# Patient Record
Sex: Female | Born: 1990 | Hispanic: No | Marital: Married | Smoking: Never smoker
Health system: Southern US, Community
[De-identification: ages and names within clinical notes are randomized; demographics above are authoritative.]

## PROBLEM LIST (undated history)

## (undated) ENCOUNTER — Inpatient Hospital Stay (HOSPITAL_COMMUNITY): Payer: Self-pay

## (undated) DIAGNOSIS — J309 Allergic rhinitis, unspecified: Secondary | ICD-10-CM

## (undated) DIAGNOSIS — Z87442 Personal history of urinary calculi: Secondary | ICD-10-CM

## (undated) DIAGNOSIS — G43909 Migraine, unspecified, not intractable, without status migrainosus: Secondary | ICD-10-CM

## (undated) DIAGNOSIS — F419 Anxiety disorder, unspecified: Secondary | ICD-10-CM

## (undated) DIAGNOSIS — N946 Dysmenorrhea, unspecified: Secondary | ICD-10-CM

## (undated) DIAGNOSIS — J45909 Unspecified asthma, uncomplicated: Secondary | ICD-10-CM

## (undated) DIAGNOSIS — D649 Anemia, unspecified: Secondary | ICD-10-CM

## (undated) DIAGNOSIS — N201 Calculus of ureter: Secondary | ICD-10-CM

## (undated) HISTORY — PX: VAGINAL HYSTERECTOMY: SUR661

## (undated) HISTORY — PX: ABDOMINAL HYSTERECTOMY: SHX81

## (undated) HISTORY — PX: ADENOIDECTOMY: SUR15

## (undated) HISTORY — PX: TONSILLECTOMY: SUR1361

## (undated) HISTORY — PX: WISDOM TOOTH EXTRACTION: SHX21

---
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
## 2014-02-26 ENCOUNTER — Other Ambulatory Visit (HOSPITAL_COMMUNITY): Payer: Self-pay | Admitting: Obstetrics and Gynecology

## 2014-02-26 DIAGNOSIS — IMO0002 Reserved for concepts with insufficient information to code with codable children: Secondary | ICD-10-CM

## 2014-03-04 ENCOUNTER — Ambulatory Visit (HOSPITAL_COMMUNITY): Payer: Self-pay

## 2014-03-18 ENCOUNTER — Encounter (HOSPITAL_COMMUNITY): Payer: Self-pay | Admitting: Emergency Medicine

## 2014-03-18 ENCOUNTER — Emergency Department (HOSPITAL_COMMUNITY): Payer: BC Managed Care – PPO

## 2014-03-18 ENCOUNTER — Emergency Department (HOSPITAL_COMMUNITY)
Admission: EM | Admit: 2014-03-18 | Discharge: 2014-03-18 | Disposition: A | Discharge status: Home / Self Care | Payer: BC Managed Care – PPO | Attending: Emergency Medicine | Admitting: Emergency Medicine

## 2014-03-18 DIAGNOSIS — N200 Calculus of kidney: Secondary | ICD-10-CM | POA: Insufficient documentation

## 2014-03-18 DIAGNOSIS — Z3202 Encounter for pregnancy test, result negative: Secondary | ICD-10-CM | POA: Insufficient documentation

## 2014-03-18 DIAGNOSIS — Z79899 Other long term (current) drug therapy: Secondary | ICD-10-CM | POA: Insufficient documentation

## 2014-03-18 LAB — CBC WITH DIFFERENTIAL/PLATELET
Basophils Absolute: 0 10*3/uL (ref 0.0–0.1)
Basophils Relative: 0 % (ref 0–1)
EOS ABS: 0.2 10*3/uL (ref 0.0–0.7)
EOS PCT: 3 % (ref 0–5)
HCT: 37.5 % (ref 36.0–46.0)
Hemoglobin: 12.6 g/dL (ref 12.0–15.0)
LYMPHS PCT: 39 % (ref 12–46)
Lymphs Abs: 2.1 10*3/uL (ref 0.7–4.0)
MCH: 31.3 pg (ref 26.0–34.0)
MCHC: 33.6 g/dL (ref 30.0–36.0)
MCV: 93.3 fL (ref 78.0–100.0)
Monocytes Absolute: 0.3 10*3/uL (ref 0.1–1.0)
Monocytes Relative: 6 % (ref 3–12)
Neutro Abs: 2.9 10*3/uL (ref 1.7–7.7)
Neutrophils Relative %: 52 % (ref 43–77)
PLATELETS: 203 10*3/uL (ref 150–400)
RBC: 4.02 MIL/uL (ref 3.87–5.11)
RDW: 11.7 % (ref 11.5–15.5)
WBC: 5.4 10*3/uL (ref 4.0–10.5)

## 2014-03-18 LAB — WET PREP, GENITAL
TRICH WET PREP: NONE SEEN
YEAST WET PREP: NONE SEEN

## 2014-03-18 LAB — URINE MICROSCOPIC-ADD ON

## 2014-03-18 LAB — URINALYSIS, ROUTINE W REFLEX MICROSCOPIC
Bilirubin Urine: NEGATIVE
Glucose, UA: NEGATIVE mg/dL
Glucose, UA: NEGATIVE mg/dL
KETONES UR: NEGATIVE mg/dL
KETONES UR: NEGATIVE mg/dL
LEUKOCYTES UA: NEGATIVE
NITRITE: NEGATIVE
Nitrite: NEGATIVE
PH: 7 (ref 5.0–8.0)
PROTEIN: NEGATIVE mg/dL
Protein, ur: NEGATIVE mg/dL
SPECIFIC GRAVITY, URINE: 1.011 (ref 1.005–1.030)
Specific Gravity, Urine: 1.025 (ref 1.005–1.030)
UROBILINOGEN UA: 0.2 mg/dL (ref 0.0–1.0)
Urobilinogen, UA: 0.2 mg/dL (ref 0.0–1.0)
pH: 6 (ref 5.0–8.0)

## 2014-03-18 LAB — POC URINE PREG, ED: Preg Test, Ur: NEGATIVE

## 2014-03-18 LAB — COMPREHENSIVE METABOLIC PANEL
ALT: 10 U/L (ref 0–35)
AST: 17 U/L (ref 0–37)
Albumin: 4.1 g/dL (ref 3.5–5.2)
Alkaline Phosphatase: 69 U/L (ref 39–117)
Anion gap: 13 (ref 5–15)
BUN: 9 mg/dL (ref 6–23)
CALCIUM: 9.6 mg/dL (ref 8.4–10.5)
CO2: 25 mEq/L (ref 19–32)
Chloride: 104 mEq/L (ref 96–112)
Creatinine, Ser: 0.73 mg/dL (ref 0.50–1.10)
GFR calc Af Amer: >90 mL/min (ref >90)
GFR calc non Af Amer: >90 mL/min (ref >90)
Glucose, Bld: 111 mg/dL — ABNORMAL HIGH (ref 70–99)
POTASSIUM: 3.6 meq/L — AB (ref 3.7–5.3)
SODIUM: 142 meq/L (ref 137–147)
TOTAL PROTEIN: 7.3 g/dL (ref 6.0–8.3)
Total Bilirubin: 0.3 mg/dL (ref 0.3–1.2)

## 2014-03-18 MED ORDER — HYDROMORPHONE HCL PF 1 MG/ML IJ SOLN
0.5000 mg | Freq: Once | INTRAMUSCULAR | Status: DC
  Filled 2014-03-18: qty 1

## 2014-03-18 MED ORDER — OXYCODONE-ACETAMINOPHEN 5-325 MG PO TABS: ORAL_TABLET | ORAL | Status: DC

## 2014-03-18 MED ORDER — PROMETHAZINE HCL 25 MG PO TABS: 25.0000 mg | ORAL_TABLET | Freq: Four times a day (QID) | ORAL | Status: DC | PRN

## 2014-03-18 MED ORDER — HYDROMORPHONE HCL PF 1 MG/ML IJ SOLN
1.0000 mg | Freq: Once | INTRAMUSCULAR | Status: AC
  Administered 2014-03-18: 1 mg via INTRAVENOUS
  Filled 2014-03-18: qty 1

## 2014-03-18 MED ORDER — ONDANSETRON HCL 4 MG/2ML IJ SOLN
4.0000 mg | Freq: Once | INTRAMUSCULAR | Status: AC
  Administered 2014-03-18: 4 mg via INTRAVENOUS
  Filled 2014-03-18: qty 2

## 2014-03-18 NOTE — Discharge Instructions (Signed)
Push fluids: take small frequent sips of water or Gatorade, do not drink any soda, juice or caffeinated beverages.  Take percocet for breakthrough pain, do not drink alcohol, drive, care for children or do other critical tasks while taking percocet.  Strain all urine and retain any stone for analysis at your urologist.   Return to the ED if you develop fever, have vomiting that is not controlled with the medication or if pain becomes too severe.  Do not hesitate to return to the Emergency Department for any new, worsening or concerning symptoms.   If you do not have a primary care doctor you can establish one at the   [facility]: [address] [place] [id number] [phone]  After you establish care. Let them know you were seen in the emergency room. They must obtain records for further management.    Kidney Stones Kidney stones (urolithiasis) are solid masses that form inside your kidneys. The intense pain is caused by the stone moving through the kidney, ureter, bladder, and urethra (urinary tract). When the stone moves, the ureter starts to spasm around the stone. The stone is usually passed in your pee (urine).  HOME CARE  Drink enough fluids to keep your pee clear or pale yellow. This helps to get the stone out.  Strain all pee through the provided strainer. Do not pee without peeing through the strainer, not even once. If you pee the stone out, catch it in the strainer. The stone may be as small as a grain of salt. Take this to your doctor. This will help your doctor figure out what you can do to try to prevent more kidney stones.  Only take medicine as told by your doctor.  Follow up with your doctor as told.  Get follow-up X-rays as told by your doctor. GET HELP IF: You have pain that gets worse even if you have been taking pain medicine. GET HELP RIGHT AWAY IF:   Your pain does not get better with medicine.  You have a fever or shaking  chills.  Your pain increases and gets worse over 18 hours.  You have new belly (abdominal) pain.  You feel faint or pass out.  You are unable to pee. MAKE SURE YOU:   Understand these instructions.  Will watch your condition.  Will get help right away if you are not doing well or get worse. Document Released: 02/16/2008 Document Revised: 05/02/2013 Document Reviewed: 01/31/2013 [facility] Patient Information 2015 [place], [place]. This information is not intended to replace advice given to you by your health care provider. Make sure you discuss any questions you have with your health care provider.

## 2014-03-18 NOTE — ED Notes (Signed)
Patient transported to Ultrasound 

## 2014-03-18 NOTE — ED Provider Notes (Signed)
CSN: [id number]     Arrival date & time 03/18/14  1116 History   First MD Initiated Contact with Patient 03/18/14 1127     Chief Complaint  Patient presents with  . Abdominal Pain     (Consider location/radiation/quality/duration/timing/severity/associated sxs/prior Treatment) HPI Comments: Patient presents today with a chief complaint of right sided abdominal pain.  Pain located in both the RLQ and RUQ, but worse in the RLQ and right pelvic region.  She reports that the pain has been present intermittently over the past three days.  She reports that the past two days the pain would typically last for a few hours at a time.  However, she reports that the pain has become constant today.  She describes the pain as a sharp pain.  She states that the pain does not radiate.  Pain worse with palpation and also movement.  She reports that the pain is associated with some pain of the right flank area also.  She states that two days ago she had one episode of vomiting, but no vomiting since that time.  She denies urinary symptoms.  Denies fever or chills.  Denies diarrhea or constipation.  Denies vaginal discharge.  She denies any history of Kidney Stones.    Patient is a 23 y.o. female presenting with abdominal pain. The history is provided by the patient.  Abdominal Pain   History reviewed. No pertinent past medical history. Past Surgical History  Procedure Laterality Date  . Tonsillectomy     No family history on file. History  Substance Use Topics  . Smoking status: Never Smoker   . Smokeless tobacco: Not on file  . Alcohol Use: No   OB History   Grav Para Term Preterm Abortions TAB SAB Ect Mult Living                 Review of Systems  Gastrointestinal: Positive for abdominal pain.  All other systems reviewed and are negative.     Allergies  Review of patient's allergies indicates no known allergies.  Home Medications   Prior to Admission medications   Medication Sig Start  Date End Date Taking? Authorizing Provider  Alpha-D-Galactosidase Charlyne Quale(BEANO) TABS Take 1 tablet by mouth as needed (flatulence).   Yes Historical Provider, MD  anti-nausea (EMETROL) solution Take 10 mLs by mouth every 15 (fifteen) minutes as needed for nausea or vomiting.   Yes Historical Provider, MD  lisdexamfetamine (VYVANSE) 50 MG capsule Take 50 mg by mouth every morning.   Yes Historical Provider, MD  senna (SENOKOT) 8.6 MG TABS tablet Take 1 tablet by mouth daily as needed for mild constipation.   Yes Historical Provider, MD   BP 110/83  Pulse 81  Temp(Src) 97.9 F (36.6 C) (Oral)  Resp 16  SpO2 100%  LMP 02/27/2014 Physical Exam  Nursing note and vitals reviewed. Constitutional: She appears well-developed and well-nourished.  Uncomfortable appearing  HENT:  Head: Normocephalic and atraumatic.  Mouth/Throat: Oropharynx is clear and moist.  Neck: Normal range of motion. Neck supple.  Cardiovascular: Normal rate, regular rhythm and normal heart sounds.   Pulmonary/Chest: Effort normal and breath sounds normal.  Abdominal: Soft. Normal appearance and bowel sounds are normal. She exhibits no distension and no mass. There is tenderness in the right lower quadrant and suprapubic area. There is CVA tenderness and tenderness at McBurney's point. There is no rigidity, no rebound, no guarding and negative Murphy's sign.  Right CVA tenderness  Genitourinary: Cervix exhibits no motion tenderness. Right  adnexum displays tenderness. Right adnexum displays no mass and no fullness. Left adnexum displays no mass, no tenderness and no fullness.  Neurological: She is alert.  Skin: Skin is warm and dry.  Psychiatric: She has a normal mood and affect.    ED Course  Procedures (including critical care time) Labs Review Labs Reviewed  WET PREP, GENITAL - Abnormal; Notable for the following:    Clue Cells Wet Prep HPF POC FEW (*)    WBC, Wet Prep HPF POC MANY (*)    All other components within  normal limits  COMPREHENSIVE METABOLIC PANEL - Abnormal; Notable for the following:    Potassium 3.6 (*)    Glucose, Bld 111 (*)    All other components within normal limits  GC/CHLAMYDIA PROBE AMP  CBC WITH DIFFERENTIAL  URINALYSIS, ROUTINE W REFLEX MICROSCOPIC  POC URINE PREG, ED    Imaging Review Kore[place] Transvaginal Non-ob  03/18/2014   CLINICAL DATA:  Pelvic pain.  EXAM: TRANSABDOMINAL AND TRANSVAGINAL ULTRASOUND OF PELVIS  DOPPLER ULTRASOUND OF OVARIES  TECHNIQUE: Both transabdominal and transvaginal ultrasound examinations of the pelvis were performed. Transabdominal technique was performed for global imaging of the pelvis including uterus, ovaries, adnexal regions, and pelvic cul-de-sac.  It was necessary to proceed with endovaginal exam following the transabdominal exam to visualize the uterus and ovaries. Color and duplex Doppler ultrasound was utilized to evaluate blood flow to the ovaries.  COMPARISON:  None.  FINDINGS: Uterus  Measurements: 6.9 x 4.3 x 2.6 cm. Possible small fibroid measuring 1.7 x 1.4 x 0.9 cm is noted in the fundus.  Endometrium  Thickness: 8.7 mm.  No focal abnormality visualized.  Right ovary  Measurements: 3.5 x 2.4 x 3.1 cm. Probable simple parovarian cyst measuring 1.1 x 1.1 x 0.7 cm is noted in right adnexal region.  Left ovary  Measurements: 3.6 x 3.0 x 2.3 cm. Normal appearance/no adnexal mass.  Pulsed Doppler evaluation of both ovaries demonstrates normal low-resistance arterial and venous waveforms.  Other findings  Trace free fluid is noted in the pelvis which most likely is physiologic.  IMPRESSION: Probable small fibroid seen seen in the uterine fundus. 1.1 cm simple paraovarian cyst is noted in right adnexal region. No other abnormality seen in the pelvis.   Electronically Signed   By: Roque LiasJames  Green M.D.   On: 03/18/2014 14:41   [place] Pelvis Complete  03/18/2014   CLINICAL DATA:  Pelvic pain.  EXAM: TRANSABDOMINAL AND TRANSVAGINAL ULTRASOUND OF PELVIS  DOPPLER  ULTRASOUND OF OVARIES  TECHNIQUE: Both transabdominal and transvaginal ultrasound examinations of the pelvis were performed. Transabdominal technique was performed for global imaging of the pelvis including uterus, ovaries, adnexal regions, and pelvic cul-de-sac.  It was necessary to proceed with endovaginal exam following the transabdominal exam to visualize the uterus and ovaries. Color and duplex Doppler ultrasound was utilized to evaluate blood flow to the ovaries.  COMPARISON:  None.  FINDINGS: Uterus  Measurements: 6.9 x 4.3 x 2.6 cm. Possible small fibroid measuring 1.7 x 1.4 x 0.9 cm is noted in the fundus.  Endometrium  Thickness: 8.7 mm.  No focal abnormality visualized.  Right ovary  Measurements: 3.5 x 2.4 x 3.1 cm. Probable simple parovarian cyst measuring 1.1 x 1.1 x 0.7 cm is noted in right adnexal region.  Left ovary  Measurements: 3.6 x 3.0 x 2.3 cm. Normal appearance/no adnexal mass.  Pulsed Doppler evaluation of both ovaries demonstrates normal low-resistance arterial and venous waveforms.  Other findings  Trace free fluid  is noted in the pelvis which most likely is physiologic.  IMPRESSION: Probable small fibroid seen seen in the uterine fundus. 1.1 cm simple paraovarian cyst is noted in right adnexal region. No other abnormality seen in the pelvis.   Electronically Signed   By: Roque Lias M.D.   On: 03/18/2014 14:41   [place] Art/ven Flow Abd Pelv Doppler  03/18/2014   CLINICAL DATA:  Pelvic pain.  EXAM: TRANSABDOMINAL AND TRANSVAGINAL ULTRASOUND OF PELVIS  DOPPLER ULTRASOUND OF OVARIES  TECHNIQUE: Both transabdominal and transvaginal ultrasound examinations of the pelvis were performed. Transabdominal technique was performed for global imaging of the pelvis including uterus, ovaries, adnexal regions, and pelvic cul-de-sac.  It was necessary to proceed with endovaginal exam following the transabdominal exam to visualize the uterus and ovaries. Color and duplex Doppler ultrasound was utilized to  evaluate blood flow to the ovaries.  COMPARISON:  None.  FINDINGS: Uterus  Measurements: 6.9 x 4.3 x 2.6 cm. Possible small fibroid measuring 1.7 x 1.4 x 0.9 cm is noted in the fundus.  Endometrium  Thickness: 8.7 mm.  No focal abnormality visualized.  Right ovary  Measurements: 3.5 x 2.4 x 3.1 cm. Probable simple parovarian cyst measuring 1.1 x 1.1 x 0.7 cm is noted in right adnexal region.  Left ovary  Measurements: 3.6 x 3.0 x 2.3 cm. Normal appearance/no adnexal mass.  Pulsed Doppler evaluation of both ovaries demonstrates normal low-resistance arterial and venous waveforms.  Other findings  Trace free fluid is noted in the pelvis which most likely is physiologic.  IMPRESSION: Probable small fibroid seen seen in the uterine fundus. 1.1 cm simple paraovarian cyst is noted in right adnexal region. No other abnormality seen in the pelvis.   Electronically Signed   By: Roque Lias M.D.   On: 03/18/2014 14:41     EKG Interpretation None     3:00 PM Patient reports that her pain had improved, but has now returned.  Pelvic ultrasound negative.  Will order CT ab/pelvis w/o contrast to assess for possible kidney stone.   4:00 PM Patient signed out to [facility], PA-C at shift change.  CT abdomen/pelvis pending.   MDM   Final diagnoses:  None   Patient presents today with a chief complaint of right sided abdominal pain.  Patient with tenderness of the adnexa with pelvic exam.  Therefore, pelvic ultrasound with doppler ordered to rule out torsion.  Ultrasound negative for torsion.  Urine pregnancy negative.  Labs today unremarkable.  UA showing hemoglobin in the urine.  CT ab/pelvis w/o contrast then ordered to rule out kidney stone.  Results pending.  Joni Reining Pisciotta will follow up on results.      Santiago Glad, PA-C 03/19/14 2226

## 2014-03-18 NOTE — Progress Notes (Signed)
  CARE MANAGEMENT ED NOTE 03/18/2014  Patient:  Norma [name],Norma [name]   Account Number:  [id number]  Date Initiated:  03/18/2014  Documentation initiated by:  Radford PaxFERRERO,Bernardine Langworthy  Subjective/Objective Assessment:   Patient presents to Ed with lower abdominal pain     Subjective/Objective Assessment Detail:     Action/Plan:   Action/Plan Detail:   Anticipated DC Date:       Status Recommendation to Physician:   Result of Recommendation:    Other ED Services  Consult Working Plan    DC Planning Services  Other  PCP issues    Choice offered to / List presented to:            Status of service:  Completed, signed off  ED Comments:   ED Comments Detail:  EDCM spoke to patient at bedside.  Patient confirms she does not have a pcp.  EDCM instructed patient to call the phone number on the back of her insurance card or go to insurance company website to help her find a pcp who is close to her and within network. Patient verbalized understanding.  No further EDCM needs at this time.

## 2014-03-18 NOTE — ED Notes (Signed)
Pt c/o lower abd pain that radiates around to back x 2 days, had nausea but no v/d. Pt denies vaginal discharge, bleeding or hx of kidney stones.

## 2014-03-18 NOTE — ED Provider Notes (Signed)
PROGRESS NOTE                                                                                                                 This is a sign-out from PA Laisure  at shift change: Norma CostainHaley [name] is a 23 y.o. female presenting with right pelvic pain. Plan is f/u CT to r/o kidney stone. Please refer to previous note for full HPI, ROS, PMH and PE.  Patient seen and examined at the bedside: She is resting comfortably. States the pain is very mild, 3/10. States that she received her pain medication approximately 20 minutes ago.LSCTA b/l; Heart is RRR; Abd without TTP, guarding or rebound, MAE, goal oriented speech. No CVA tenderness bilaterally.   Filed Vitals:   03/18/14 1120 03/18/14 1454  BP: 110/83 114/63  Pulse: 81 71  Temp: 97.9 F (36.6 C)   TempSrc: Oral   Resp: 16 18  SpO2: 100% 99%    Medications  HYDROmorphone (DILAUDID) injection 0.5 mg (0.5 mg Intravenous Not Given 03/18/14 1836)  HYDROmorphone (DILAUDID) injection 1 mg (1 mg Intravenous Given 03/18/14 1209)  ondansetron (ZOFRAN) injection 4 mg (4 mg Intravenous Given 03/18/14 1209)  HYDROmorphone (DILAUDID) injection 1 mg (1 mg Intravenous Given 03/18/14 1519)    Norma CostainHaley [name] is a 23 y.o. female presenting with right pelvic pain. Patient has a 0.4 cm stone at the right UVJ, may have passed into the bladder. There is moderate hydronephrosis. His urinalysis shows many bacteria however the sample is highly contaminated. Will obtain a clean-catch sample to evaluate for infected stone.  Clean catch urinalysis shows no signs of infection. Advise patient to strain all urine and follow with urology. Discussed return precautions  Evaluation does not show pathology that would require ongoing emergent intervention or inpatient treatment. Pt is hemodynamically stable and mentating appropriately. Discussed findings and plan with patient/guardian, who agrees with care plan. All questions answered. Return precautions discussed and outpatient follow up  given.   Discharge Medication List as of 03/18/2014  6:16 PM    START taking these medications   Details  oxyCODONE-acetaminophen (PERCOCET/ROXICET) 5-325 MG per tablet 1 to 2 tabs PO q6hrs  PRN for pain, Print    promethazine (PHENERGAN) 25 MG tablet Take 1 tablet (25 mg total) by mouth every 6 (six) hours as needed for nausea or vomiting., Starting 03/18/2014, Until Discontinued, [employer] Kroger[employer]              Ty Oshima, PA-C 03/18/14 2027

## 2014-03-18 NOTE — ED Notes (Signed)
Pelvic setup and ready 

## 2014-03-19 LAB — GC/CHLAMYDIA PROBE AMP
CT Probe RNA: NEGATIVE
GC PROBE AMP APTIMA: NEGATIVE

## 2014-03-19 NOTE — ED Provider Notes (Signed)
Medical screening examination/treatment/procedure(s) were performed by non-physician practitioner and as supervising physician I was immediately available for consultation/collaboration.   EKG Interpretation None       Alexina Niccoli M Nicollette Wilhelmi, MD 03/19/14 1353 

## 2014-03-20 ENCOUNTER — Other Ambulatory Visit: Payer: Self-pay | Admitting: Urology

## 2014-03-20 NOTE — ED Provider Notes (Signed)
Medical screening examination/treatment/procedure(s) were performed by non-physician practitioner and as supervising physician I was immediately available for consultation/collaboration.   EKG Interpretation None        Zaraya Delauder David Adrienne Delay III, MD 03/20/14 1506 

## 2014-03-21 ENCOUNTER — Encounter (HOSPITAL_BASED_OUTPATIENT_CLINIC_OR_DEPARTMENT_OTHER): Payer: Self-pay | Admitting: *Deleted

## 2014-03-28 ENCOUNTER — Ambulatory Visit (HOSPITAL_BASED_OUTPATIENT_CLINIC_OR_DEPARTMENT_OTHER): Admission: RE | Admit: 2014-03-28 | Payer: BC Managed Care – PPO | Source: Ambulatory Visit | Admitting: Urology

## 2014-03-28 ENCOUNTER — Encounter (HOSPITAL_BASED_OUTPATIENT_CLINIC_OR_DEPARTMENT_OTHER): Admission: RE | Payer: Self-pay | Source: Ambulatory Visit

## 2014-03-28 HISTORY — DX: Calculus of ureter: N20.1

## 2014-03-28 SURGERY — CYSTOSCOPY, WITH CALCULUS MANIPULATION OR REMOVAL
Anesthesia: Choice | Laterality: Right

## 2014-06-07 LAB — OB RESULTS CONSOLE GC/CHLAMYDIA
CHLAMYDIA, DNA PROBE: NEGATIVE
Gonorrhea: NEGATIVE

## 2014-06-07 LAB — OB RESULTS CONSOLE RPR: RPR: NONREACTIVE

## 2014-06-07 LAB — OB RESULTS CONSOLE ANTIBODY SCREEN: Antibody Screen: NEGATIVE

## 2014-06-07 LAB — OB RESULTS CONSOLE ABO/RH: RH TYPE: POSITIVE

## 2014-06-07 LAB — OB RESULTS CONSOLE HIV ANTIBODY (ROUTINE TESTING): HIV: NONREACTIVE

## 2014-06-07 LAB — OB RESULTS CONSOLE RUBELLA ANTIBODY, IGM: Rubella: IMMUNE

## 2014-06-07 LAB — OB RESULTS CONSOLE HEPATITIS B SURFACE ANTIGEN: HEP B S AG: NEGATIVE

## 2014-09-13 NOTE — L&D Delivery Note (Signed)
Operative Delivery Note  At 7:02 PM a viable female was delivered via Vaginal, Vacuum Investment banker, operational(Extractor).  Presentation: vertex; Position: Right,, Occiput,, Posterior; Station: +4.  KIWI vacuum applied at +2 station which brought the baby to crowning then popped off. Patient continued to push without help until most of the head was crowning but FHR decreased to 80 bpm and no further movement. KIWI was reapplied and delivered baby's head with 1 traction at a maximum pressure of 500 mmHg  Verbal consent: obtained from patient.  Risks and benefits discussed in detail.  Risks include, but are not limited to the risks of anesthesia, bleeding, infection, damage to maternal tissues, fetal cephalhematoma.  There is also the risk of inability to effect vaginal delivery of the head, or shoulder dystocia that cannot be resolved by established maneuvers, leading to the need for emergency cesarean section.  APGAR: 9, 9 Placenta status: Intact, Spontaneous.   Cord: 3 vessels   Anesthesia: Epidural  Episiotomy: None Lacerations: 2nd degree Suture Repair: 3.0 Monocryl Est. Blood Loss (mL):  200  Mom to postpartum.  Baby to Couplet care / Skin to Skin.  Jodee Wagenaar A 01/31/2015, 7:21 PM

## 2014-11-12 ENCOUNTER — Inpatient Hospital Stay (HOSPITAL_COMMUNITY)
Admission: AD | Admit: 2014-11-12 | Payer: BC Managed Care – PPO | Source: Ambulatory Visit | Admitting: Obstetrics and Gynecology

## 2014-12-31 LAB — OB RESULTS CONSOLE GBS: GBS: NEGATIVE

## 2015-01-29 ENCOUNTER — Inpatient Hospital Stay (HOSPITAL_COMMUNITY)
Admission: AD | Admit: 2015-01-29 | Discharge: 2015-01-29 | Disposition: A | Discharge status: Home / Self Care | Payer: BLUE CROSS/BLUE SHIELD | Source: Ambulatory Visit | Attending: Obstetrics and Gynecology | Admitting: Obstetrics and Gynecology

## 2015-01-29 ENCOUNTER — Encounter (HOSPITAL_COMMUNITY): Payer: Self-pay

## 2015-01-29 DIAGNOSIS — Z6832 Body mass index (BMI) 32.0-32.9, adult: Secondary | ICD-10-CM

## 2015-01-29 DIAGNOSIS — O3663X0 Maternal care for excessive fetal growth, third trimester, not applicable or unspecified: Secondary | ICD-10-CM | POA: Diagnosis present

## 2015-01-29 DIAGNOSIS — Z833 Family history of diabetes mellitus: Secondary | ICD-10-CM

## 2015-01-29 DIAGNOSIS — E559 Vitamin D deficiency, unspecified: Secondary | ICD-10-CM | POA: Diagnosis present

## 2015-01-29 DIAGNOSIS — Z823 Family history of stroke: Secondary | ICD-10-CM

## 2015-01-29 DIAGNOSIS — Z8249 Family history of ischemic heart disease and other diseases of the circulatory system: Secondary | ICD-10-CM

## 2015-01-29 DIAGNOSIS — E669 Obesity, unspecified: Secondary | ICD-10-CM | POA: Diagnosis present

## 2015-01-29 DIAGNOSIS — O99214 Obesity complicating childbirth: Secondary | ICD-10-CM | POA: Diagnosis present

## 2015-01-29 DIAGNOSIS — Z3A40 40 weeks gestation of pregnancy: Secondary | ICD-10-CM | POA: Diagnosis present

## 2015-01-29 DIAGNOSIS — Z87442 Personal history of urinary calculi: Secondary | ICD-10-CM

## 2015-01-29 LAB — AMNISURE RUPTURE OF MEMBRANE (ROM) NOT AT ARMC: AMNISURE: NEGATIVE

## 2015-01-29 NOTE — Discharge Instructions (Signed)
Braxton Hicks Contractions °Contractions of the uterus can occur throughout pregnancy. Contractions are not always a sign that you are in labor.  °WHAT ARE BRAXTON HICKS CONTRACTIONS?  °Contractions that occur before labor are called Braxton Hicks contractions, or false labor. Toward the end of pregnancy (32-34 weeks), these contractions can develop more often and may become more forceful. This is not true labor because these contractions do not result in opening (dilatation) and thinning of the cervix. They are sometimes difficult to tell apart from true labor because these contractions can be forceful and people have different pain tolerances. You should not feel embarrassed if you go to the hospital with false labor. Sometimes, the only way to tell if you are in true labor is for your health care provider to look for changes in the cervix. °If there are no prenatal problems or other health problems associated with the pregnancy, it is completely safe to be sent home with false labor and await the onset of true labor. °HOW CAN YOU TELL THE DIFFERENCE BETWEEN TRUE AND FALSE LABOR? °False Labor °· The contractions of false labor are usually shorter and not as hard as those of true labor.   °· The contractions are usually irregular.   °· The contractions are often felt in the front of the lower abdomen and in the groin.   °· The contractions may go away when you walk around or change positions while lying down.   °· The contractions get weaker and are shorter lasting as time goes on.   °· The contractions do not usually become progressively stronger, regular, and closer together as with true labor.   °True Labor °1. Contractions in true labor last 30-70 seconds, become very regular, usually become more intense, and increase in frequency.   °2. The contractions do not go away with walking.   °3. The discomfort is usually felt in the top of the uterus and spreads to the lower abdomen and low back.   °4. True labor can  be determined by your health care provider with an exam. This will show that the cervix is dilating and getting thinner.   °WHAT TO REMEMBER °· Keep up with your usual exercises and follow other instructions given by your health care provider.   °· Take medicines as directed by your health care provider.   °· Keep your regular prenatal appointments.   °· Eat and drink lightly if you think you are going into labor.   °· If Braxton Hicks contractions are making you uncomfortable:   °· Change your position from lying down or resting to walking, or from walking to resting.   °· Sit and rest in a tub of warm water.   °· Drink 2-3 glasses of water. Dehydration may cause these contractions.   °· Do slow and deep breathing several times an hour.   °WHEN SHOULD I SEEK IMMEDIATE MEDICAL CARE? °Seek immediate medical care if: °· Your contractions become stronger, more regular, and closer together.   °· You have fluid leaking or gushing from your vagina.   °· You have a fever.   °· You pass blood-tinged mucus.   °· You have vaginal bleeding.   °· You have continuous abdominal pain.   °· You have low back pain that you never had before.   °· You feel your baby's head pushing down and causing pelvic pressure.   °· Your baby is not moving as much as it used to.   °Document Released: 08/30/2005 Document Revised: 09/04/2013 Document Reviewed: 06/11/2013 °ExitCare® Patient Information ©2015 [employer]. This information is not intended to replace advice given to you by your health care   provider. Make sure you discuss any questions you have with your health care provider. ° °Fetal Movement Counts °Patient Name: __________________________________________________ Patient Due Date: ____________________ °Performing a fetal movement count is highly recommended in high-risk pregnancies, but it is good for every pregnant woman to do. Your health care provider may ask you to start counting fetal movements at 28 weeks of the pregnancy. Fetal  movements often increase: °· After eating a full meal. °· After physical activity. °· After eating or drinking something sweet or cold. °· At rest. °Pay attention to when you feel the baby is most active. This will help you notice a pattern of your baby's sleep and wake cycles and what factors contribute to an increase in fetal movement. It is important to perform a fetal movement count at the same time each day when your baby is normally most active.  °HOW TO COUNT FETAL MOVEMENTS °5. Find a quiet and comfortable area to sit or lie down on your left side. Lying on your left side provides the best blood and oxygen circulation to your baby. °6. Write down the day and time on a sheet of paper or in a journal. °7. Start counting kicks, flutters, swishes, rolls, or jabs in a 2-hour period. You should feel at least 10 movements within 2 hours. °8. If you do not feel 10 movements in 2 hours, wait 2-3 hours and count again. Look for a change in the pattern or not enough counts in 2 hours. °SEEK MEDICAL CARE IF: °· You feel less than 10 counts in 2 hours, tried twice. °· There is no movement in over an hour. °· The pattern is changing or taking longer each day to reach 10 counts in 2 hours. °· You feel the baby is not moving as he or she usually does. °Date: ____________ Movements: ____________ Start time: ____________ Finish time: ____________  °Date: ____________ Movements: ____________ Start time: ____________ Finish time: ____________ °Date: ____________ Movements: ____________ Start time: ____________ Finish time: ____________ °Date: ____________ Movements: ____________ Start time: ____________ Finish time: ____________ °Date: ____________ Movements: ____________ Start time: ____________ Finish time: ____________ °Date: ____________ Movements: ____________ Start time: ____________ Finish time: ____________ °Date: ____________ Movements: ____________ Start time: ____________ Finish time: ____________ °Date: ____________  Movements: ____________ Start time: ____________ Finish time: ____________  °Date: ____________ Movements: ____________ Start time: ____________ Finish time: ____________ °Date: ____________ Movements: ____________ Start time: ____________ Finish time: ____________ °Date: ____________ Movements: ____________ Start time: ____________ Finish time: ____________ °Date: ____________ Movements: ____________ Start time: ____________ Finish time: ____________ °Date: ____________ Movements: ____________ Start time: ____________ Finish time: ____________ °Date: ____________ Movements: ____________ Start time: ____________ Finish time: ____________ °Date: ____________ Movements: ____________ Start time: ____________ Finish time: ____________  °Date: ____________ Movements: ____________ Start time: ____________ Finish time: ____________ °Date: ____________ Movements: ____________ Start time: ____________ Finish time: ____________ °Date: ____________ Movements: ____________ Start time: ____________ Finish time: ____________ °Date: ____________ Movements: ____________ Start time: ____________ Finish time: ____________ °Date: ____________ Movements: ____________ Start time: ____________ Finish time: ____________ °Date: ____________ Movements: ____________ Start time: ____________ Finish time: ____________ °Date: ____________ Movements: ____________ Start time: ____________ Finish time: ____________  °Date: ____________ Movements: ____________ Start time: ____________ Finish time: ____________ °Date: ____________ Movements: ____________ Start time: ____________ Finish time: ____________ °Date: ____________ Movements: ____________ Start time: ____________ Finish time: ____________ °Date: ____________ Movements: ____________ Start time: ____________ Finish time: ____________ °Date: ____________ Movements: ____________ Start time: ____________ Finish time: ____________ °Date: ____________ Movements: ____________ Start time:  ____________ Finish time: ____________ °Date: ____________ Movements:   ____________ Start time: ____________ Finish time: ____________  °Date: ____________ Movements: ____________ Start time: ____________ Finish time: ____________ °Date: ____________ Movements: ____________ Start time: ____________ Finish time: ____________ °Date: ____________ Movements: ____________ Start time: ____________ Finish time: ____________ °Date: ____________ Movements: ____________ Start time: ____________ Finish time: ____________ °Date: ____________ Movements: ____________ Start time: ____________ Finish time: ____________ °Date: ____________ Movements: ____________ Start time: ____________ Finish time: ____________ °Date: ____________ Movements: ____________ Start time: ____________ Finish time: ____________  °Date: ____________ Movements: ____________ Start time: ____________ Finish time: ____________ °Date: ____________ Movements: ____________ Start time: ____________ Finish time: ____________ °Date: ____________ Movements: ____________ Start time: ____________ Finish time: ____________ °Date: ____________ Movements: ____________ Start time: ____________ Finish time: ____________ °Date: ____________ Movements: ____________ Start time: ____________ Finish time: ____________ °Date: ____________ Movements: ____________ Start time: ____________ Finish time: ____________ °Date: ____________ Movements: ____________ Start time: ____________ Finish time: ____________  °Date: ____________ Movements: ____________ Start time: ____________ Finish time: ____________ °Date: ____________ Movements: ____________ Start time: ____________ Finish time: ____________ °Date: ____________ Movements: ____________ Start time: ____________ Finish time: ____________ °Date: ____________ Movements: ____________ Start time: ____________ Finish time: ____________ °Date: ____________ Movements: ____________ Start time: ____________ Finish time: ____________ °Date:  ____________ Movements: ____________ Start time: ____________ Finish time: ____________ °Date: ____________ Movements: ____________ Start time: ____________ Finish time: ____________  °Date: ____________ Movements: ____________ Start time: ____________ Finish time: ____________ °Date: ____________ Movements: ____________ Start time: ____________ Finish time: ____________ °Date: ____________ Movements: ____________ Start time: ____________ Finish time: ____________ °Date: ____________ Movements: ____________ Start time: ____________ Finish time: ____________ °Date: ____________ Movements: ____________ Start time: ____________ Finish time: ____________ °Date: ____________ Movements: ____________ Start time: ____________ Finish time: ____________ °Document Released: 09/29/2006 Document Revised: 01/14/2014 Document Reviewed: 06/26/2012 °ExitCare® Patient Information ©2015 [employer]. This information is not intended to replace advice given to you by your health care provider. Make sure you discuss any questions you have with your health care provider. ° °

## 2015-01-29 NOTE — MAU Note (Signed)
At 2 pm, was on toilet having BM and liquid came out, some leaking of fluid since then. Some bloody show today. Positive fetal movement.

## 2015-01-30 ENCOUNTER — Inpatient Hospital Stay (HOSPITAL_COMMUNITY)
Admission: AD | Admit: 2015-01-30 | Discharge: 2015-02-02 | DRG: 775 | Disposition: A | Discharge status: Home / Self Care | Payer: BLUE CROSS/BLUE SHIELD | Source: Ambulatory Visit | Attending: Obstetrics and Gynecology | Admitting: Obstetrics and Gynecology

## 2015-01-30 ENCOUNTER — Encounter (HOSPITAL_COMMUNITY): Payer: Self-pay | Admitting: *Deleted

## 2015-01-30 DIAGNOSIS — J3081 Allergic rhinitis due to animal (cat) (dog) hair and dander: Secondary | ICD-10-CM

## 2015-01-30 DIAGNOSIS — Z8759 Personal history of other complications of pregnancy, childbirth and the puerperium: Secondary | ICD-10-CM

## 2015-01-30 DIAGNOSIS — E559 Vitamin D deficiency, unspecified: Secondary | ICD-10-CM

## 2015-01-30 DIAGNOSIS — Z8742 Personal history of other diseases of the female genital tract: Secondary | ICD-10-CM

## 2015-01-30 DIAGNOSIS — Z8249 Family history of ischemic heart disease and other diseases of the circulatory system: Secondary | ICD-10-CM | POA: Diagnosis not present

## 2015-01-30 DIAGNOSIS — N200 Calculus of kidney: Secondary | ICD-10-CM

## 2015-01-30 DIAGNOSIS — Z6832 Body mass index (BMI) 32.0-32.9, adult: Secondary | ICD-10-CM | POA: Diagnosis not present

## 2015-01-30 DIAGNOSIS — S3992XA Unspecified injury of lower back, initial encounter: Secondary | ICD-10-CM

## 2015-01-30 DIAGNOSIS — Z3A40 40 weeks gestation of pregnancy: Secondary | ICD-10-CM | POA: Diagnosis present

## 2015-01-30 DIAGNOSIS — Z833 Family history of diabetes mellitus: Secondary | ICD-10-CM | POA: Diagnosis not present

## 2015-01-30 DIAGNOSIS — O3663X0 Maternal care for excessive fetal growth, third trimester, not applicable or unspecified: Secondary | ICD-10-CM | POA: Diagnosis present

## 2015-01-30 DIAGNOSIS — J45990 Exercise induced bronchospasm: Secondary | ICD-10-CM | POA: Insufficient documentation

## 2015-01-30 DIAGNOSIS — Z823 Family history of stroke: Secondary | ICD-10-CM | POA: Diagnosis not present

## 2015-01-30 DIAGNOSIS — E669 Obesity, unspecified: Secondary | ICD-10-CM | POA: Diagnosis present

## 2015-01-30 DIAGNOSIS — Z87442 Personal history of urinary calculi: Secondary | ICD-10-CM | POA: Diagnosis not present

## 2015-01-30 DIAGNOSIS — F909 Attention-deficit hyperactivity disorder, unspecified type: Secondary | ICD-10-CM

## 2015-01-30 DIAGNOSIS — O99214 Obesity complicating childbirth: Secondary | ICD-10-CM | POA: Diagnosis present

## 2015-01-30 HISTORY — DX: Unspecified asthma, uncomplicated: J45.909

## 2015-01-30 HISTORY — DX: Dysmenorrhea, unspecified: N94.6

## 2015-01-30 HISTORY — DX: Anemia, unspecified: D64.9

## 2015-01-30 LAB — CBC
HCT: 40.1 % (ref 36.0–46.0)
HEMOGLOBIN: 14.3 g/dL (ref 12.0–15.0)
MCH: 34.1 pg — AB (ref 26.0–34.0)
MCHC: 35.7 g/dL (ref 30.0–36.0)
MCV: 95.7 fL (ref 78.0–100.0)
Platelets: 197 10*3/uL (ref 150–400)
RBC: 4.19 MIL/uL (ref 3.87–5.11)
RDW: 13.6 % (ref 11.5–15.5)
WBC: 12.1 10*3/uL — ABNORMAL HIGH (ref 4.0–10.5)

## 2015-01-30 LAB — TYPE AND SCREEN
ABO/RH(D): B POS
Antibody Screen: NEGATIVE

## 2015-01-30 MED ORDER — OXYCODONE-ACETAMINOPHEN 5-325 MG PO TABS: 2.0000 | ORAL_TABLET | ORAL | Status: DC | PRN

## 2015-01-30 MED ORDER — ONDANSETRON HCL 4 MG/2ML IJ SOLN: 4.0000 mg | Freq: Four times a day (QID) | INTRAMUSCULAR | Status: DC | PRN

## 2015-01-30 MED ORDER — ACETAMINOPHEN 325 MG PO TABS
650.0000 mg | ORAL_TABLET | ORAL | Status: DC | PRN
  Administered 2015-01-31: 650 mg via ORAL
  Filled 2015-01-30: qty 2

## 2015-01-30 MED ORDER — LIDOCAINE HCL (PF) 1 % IJ SOLN
30.0000 mL | INTRAMUSCULAR | Status: DC | PRN
  Filled 2015-01-30: qty 30

## 2015-01-30 MED ORDER — FLEET ENEMA 7-19 GM/118ML RE ENEM: 1.0000 | ENEMA | RECTAL | Status: DC | PRN

## 2015-01-30 MED ORDER — HYDROXYZINE HCL 50 MG PO TABS
50.0000 mg | ORAL_TABLET | Freq: Four times a day (QID) | ORAL | Status: DC | PRN
  Filled 2015-01-30: qty 1

## 2015-01-30 MED ORDER — OXYCODONE-ACETAMINOPHEN 5-325 MG PO TABS: 1.0000 | ORAL_TABLET | ORAL | Status: DC | PRN

## 2015-01-30 MED ORDER — CITRIC ACID-SODIUM CITRATE 334-500 MG/5ML PO SOLN: 30.0000 mL | ORAL | Status: DC | PRN

## 2015-01-30 MED ORDER — SODIUM CHLORIDE 0.9 % IV SOLN: 250.0000 mL | INTRAVENOUS | Status: DC | PRN

## 2015-01-30 MED ORDER — LACTATED RINGERS IV BOLUS (SEPSIS)
500.0000 mL | Freq: Once | INTRAVENOUS | Status: AC
  Administered 2015-01-30: 350 mL via INTRAVENOUS

## 2015-01-30 NOTE — Progress Notes (Signed)
  Subjective: Crying out in pain. Spouse, doula, mother and mother-in-law at bedside.   Objective: BP 136/74 mmHg  Pulse 128  Temp(Src) 99.3 F (37.4 C) (Oral)  Resp 18  Ht 5\' 6"  (1.676 m)  Wt 90.266 kg (199 lb)  BMI 32.13 kg/m2  LMP 02/27/2014     Today's Vitals   01/30/15 2009 01/30/15 2213 01/30/15 2332  BP: 123/88 136/74   Pulse: 124 128   Temp: 98.8 F (37.1 C) 99.3 F (37.4 C) 98.6 F (37 C)  TempSrc: Oral Oral Oral  Resp: 20 18   Height: 5\' 6"  (1.676 m) 5\' 6"  (1.676 m)   Weight: 90.436 kg (199 lb 6 oz) 90.266 kg (199 lb)   PainSc: 7      Tmax: 99.3 FHT: BL 140 w/ moderate variability, +accels, no decels UC:   regular, every 3 minutes, palpate moderate SVE:   Dilation: 5 Effacement (%): 70 Station: -1 Exam by:: Thornell MuleK. Jenika Chiem CNM  BBOW  Assessment:  IUP at 40.3 wks Approaching active labor GBS neg Maternal tachycardia, low grade temp x1 - likely due to dehydration  Plan: 500 ml LR bolus Encouraged standing in shower while waiting tub fill Anticipate SVD   Sherre ScarletWILLIAMS, Kristien Salatino CNM 01/30/2015, 11:45 PM

## 2015-01-30 NOTE — MAU Note (Signed)
Contractions every 3-5 minutes. Denies bright red vaginal bleeding.  States Positive bloody show.  Positive fetal movement Denies SROM/LOF  Denies any infections/complications of pregnancy  GBS negative per patient   

## 2015-01-30 NOTE — MAU Note (Signed)
PT  SAYS   SHE STARTED  HURTING  BAD  6PM.   VE IN OFFICE YESTERDAY   2  CM.    DENIES HSV AND MRSA.   GBS-  NEG

## 2015-01-30 NOTE — H&P (Signed)
Norma [name] is a 24 y.o. female, G1P0 at 40.3 weeks, presenting for ctxs q 3 min x 1 hr, but prodromal labor x 1-2 days. +FM. Denies leaking or VB. Attended WB class and planning WB. Doula Edd Arbour(Jamilla Walker) present.  Of note: Tailbone pain since middle school. No significant issues this pregnancy.  Patient Active Problem List   Diagnosis Date Noted  . Normal labor 01/30/2015  . 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 Vitamin D deficiency 01/30/2015  . ADHD (attention deficit hyperactivity disorder) 01/30/2015  . Exercise-induced asthma 01/30/2015  . Tailbone injury - middle school 01/30/2015  . Calculus of kidney - passed spontaneously in Jul 2015 01/30/2015  . History of female infertility 01/30/2015  . BMI 32.0-32.9,adult 01/30/2015    History of present pregnancy: Patient entered care at 8.2 weeks.   EDC of 01/27/15 was established by 8.2 wk scan; LMP uncertain.   Anatomy scan: 19.1 weeks, with normal findings and a posterior placenta.   Additional US evaluations: None.   Significant prenatal events: Headaches to left side of head, dizziness, nausea and blurred vision in first trimester - treated w/ T3 -- Claritin/Zyrtec recommended.Tailbone pain (injured in middle school) - warm tub baths, chiropractor and Tylenol recommended. Desires WB. Attended class and consents obtained x 2 - has doula. Participated in yoga and took childbirth classes. Cervical pain at 33 wks - cervical pain vs PTL discussed. Reported odor of urine at 36.1 wks (hx of recurrent UTIs); urine culture neg. Diarrhea and increased vaginal discharge at 37.3 wks - no significant findings. No MAU visits. TWG 32 lbs. Flu vaccine on 07/19/14. Last evaluation: Office on 01/29/15 by Dr. Sallye OberKulwa @ 40.2 wks. Cephalic presentation. FHR 132 bpm. Cvx 1-2/50/-3. BP 102/60. Wt: 200 lbs.  OB History    Gravida Para Term Preterm AB TAB SAB Ectopic Multiple Living   1              Past Medical History  Diagnosis Date  . Right  ureteral stone   . Anemia   . Dysmenorrhea   . Asthma     exercise induced   Past Surgical History  Procedure Laterality Date  . Tonsillectomy    . Wisdom tooth extraction    Plantar's wart removed in 2008  Family History: family history is not on file.Mother w/ thyroid gland disorder, HTN and DM; MGM w/ thyroid gland disorder, HTN, CVA, Hypercholesterolemia and dementia; MGF w/ heart disease, HTN, DM and malignant melanoma of skin, and PGM w/ heart disease. Social History:  reports that she has never smoked. She has never used smokeless tobacco. She reports that she drinks alcohol. She reports that she does not use illicit drugs.Identifies as Caucasian and African-American. Has a 4-yr college degree, married to Onalee HuaDavid and is employed as a Production designer, theatre/television/filmmanager. She is of the [place] faith and will accept blood in an emergency. Pt's mother and mother-in-law also present and supportive.    Prenatal Transfer Tool  Maternal Diabetes: No Genetic Screening: Declined Maternal Ultrasounds/Referrals: Normal Fetal Ultrasounds or other Referrals:  None Maternal Substance Abuse:  No Significant Maternal Medications:  Meds include: Other: PNVs Significant Maternal Lab Results: Lab values include: Group B Strep negative  TDAP: NA Flu: 07/19/14 Completed HPV series in Aug 2009  ROS: As noted in HPI  No Known Allergies   Dilation: 4 Effacement (%): 70 Station: -2 Exam by:: Sherre ScarletKimberly Taiven Greenley, CNM  Blood pressure 136/74, pulse 128, temperature 99.3 F (37.4 C), temperature source Oral, resp.  rate 18, height 5\' 6"  (1.676 m), weight 90.266 kg (199 lb), last menstrual period 02/27/2014.  Chest clear Heart RRR without murmur Abd gravid, NT, FH CWD Pelvic: As above, cephalic by Leopolds and VE EFW: 8lbs Ext: WNL  FHR: Category 1 UCs: q 3 min  Prenatal labs: ABO, Rh: B positive (06/07/14) Antibody: NEG (05/19 2123) Rubella: Immune RPR: Nonreactive (09/25 0000)  HBsAg: Negative (09/25 0000)  HIV:  Non-reactive (09/25 0000)  GBS: Negative (04/19 0000) Sickle cell/Hgb electrophoresis: Normal study Pap: Normal on 08/01/13 GC: Neg on 06/07/14 Chlamydia: Neg on 06/07/14 Genetic screenings: Declined Glucola: Normal at 91 Other: 45K Citrobacter Freundii on NOB urine culture, 40K enterococcus species on 06/19/14, neg urine culture on 08/06/14, 20K enterococcus on 12/31/14, BG 73 on 09/03/14 due to glycosuria. Hgb 13.1 at NOB, 12.2 at 28 weeks  Results for orders placed or performed during the hospital encounter of 01/30/15 (from the past 24 hour(s))  CBC     Status: Abnormal   Collection Time: 01/30/15  9:23 PM  Result Value Ref Range   WBC 12.1 (H) 4.0 - 10.5 K/uL   RBC 4.19 3.87 - 5.11 MIL/uL   Hemoglobin 14.3 12.0 - 15.0 g/dL   HCT 16.140.1 09.636.0 - 04.546.0 %   MCV 95.7 78.0 - 100.0 fL   MCH 34.1 (H) 26.0 - 34.0 pg   MCHC 35.7 30.0 - 36.0 g/dL   RDW 40.913.6 81.111.5 - 91.415.5 %   Platelets 197 150 - 400 K/uL  Type and screen     Status: None   Collection Time: 01/30/15  9:23 PM  Result Value Ref Range   ABO/RH(D) B POS    Antibody Screen NEG    Sample Expiration 02/02/2015     Assessment: IUP at 40.3 wks Latent labor -- BBOW GBS neg Desires waterbirth BMI 32  [facility] with Dr. Charlotta Newtonzan Routine [facility] orders Intermittent monitoring Expectant management Consult prn Anticipate progress and SVD    Robyne AskewWILLIAMS, KIMBERLYCNM, MS 01/30/2015, 8:57 PM

## 2015-01-31 ENCOUNTER — Inpatient Hospital Stay (HOSPITAL_COMMUNITY): Payer: BLUE CROSS/BLUE SHIELD | Admitting: Anesthesiology

## 2015-01-31 ENCOUNTER — Encounter (HOSPITAL_COMMUNITY): Payer: Self-pay | Admitting: Anesthesiology

## 2015-01-31 DIAGNOSIS — Z8759 Personal history of other complications of pregnancy, childbirth and the puerperium: Secondary | ICD-10-CM

## 2015-01-31 LAB — RPR: RPR Ser Ql: NONREACTIVE

## 2015-01-31 LAB — ABO/RH: ABO/RH(D): B POS

## 2015-01-31 MED ORDER — SIMETHICONE 80 MG PO CHEW: 80.0000 mg | CHEWABLE_TABLET | ORAL | Status: DC | PRN

## 2015-01-31 MED ORDER — OXYCODONE-ACETAMINOPHEN 5-325 MG PO TABS: 2.0000 | ORAL_TABLET | ORAL | Status: DC | PRN

## 2015-01-31 MED ORDER — LIDOCAINE HCL (PF) 1 % IJ SOLN
INTRAMUSCULAR | Status: DC | PRN
  Administered 2015-01-31 (×2): 4 mL

## 2015-01-31 MED ORDER — TERBUTALINE SULFATE 1 MG/ML IJ SOLN
0.2500 mg | Freq: Once | INTRAMUSCULAR | Status: DC | PRN
  Filled 2015-01-31: qty 1

## 2015-01-31 MED ORDER — PRENATAL MULTIVITAMIN CH
1.0000 | ORAL_TABLET | Freq: Every day | ORAL | Status: DC
  Administered 2015-02-01: 1 via ORAL
  Filled 2015-01-31: qty 1

## 2015-01-31 MED ORDER — OXYTOCIN 40 UNITS IN LACTATED RINGERS INFUSION - SIMPLE MED
62.5000 mL/h | INTRAVENOUS | Status: DC
  Filled 2015-01-31: qty 1000

## 2015-01-31 MED ORDER — SENNOSIDES-DOCUSATE SODIUM 8.6-50 MG PO TABS
2.0000 | ORAL_TABLET | ORAL | Status: DC
  Administered 2015-02-01 (×2): 2 via ORAL
  Filled 2015-01-31 (×2): qty 2

## 2015-01-31 MED ORDER — DIPHENHYDRAMINE HCL 50 MG/ML IJ SOLN: 12.5000 mg | INTRAMUSCULAR | Status: DC | PRN

## 2015-01-31 MED ORDER — ONDANSETRON HCL 4 MG PO TABS: 4.0000 mg | ORAL_TABLET | ORAL | Status: DC | PRN

## 2015-01-31 MED ORDER — PHENYLEPHRINE 40 MCG/ML (10ML) SYRINGE FOR IV PUSH (FOR BLOOD PRESSURE SUPPORT)
80.0000 ug | PREFILLED_SYRINGE | INTRAVENOUS | Status: DC | PRN
  Filled 2015-01-31: qty 2

## 2015-01-31 MED ORDER — CITRIC ACID-SODIUM CITRATE 334-500 MG/5ML PO SOLN: 30.0000 mL | ORAL | Status: DC | PRN

## 2015-01-31 MED ORDER — FENTANYL 2.5 MCG/ML BUPIVACAINE 1/10 % EPIDURAL INFUSION (WH - ANES)
14.0000 mL/h | INTRAMUSCULAR | Status: DC | PRN
  Administered 2015-01-31 (×4): 14 mL/h via EPIDURAL

## 2015-01-31 MED ORDER — DIBUCAINE 1 % RE OINT: 1.0000 "application " | TOPICAL_OINTMENT | RECTAL | Status: DC | PRN

## 2015-01-31 MED ORDER — TETANUS-DIPHTH-ACELL PERTUSSIS 5-2.5-18.5 LF-MCG/0.5 IM SUSP
0.5000 mL | Freq: Once | INTRAMUSCULAR | Status: AC
  Administered 2015-02-02: 0.5 mL via INTRAMUSCULAR
  Filled 2015-01-31: qty 0.5

## 2015-01-31 MED ORDER — PHENYLEPHRINE 40 MCG/ML (10ML) SYRINGE FOR IV PUSH (FOR BLOOD PRESSURE SUPPORT)
80.0000 ug | PREFILLED_SYRINGE | INTRAVENOUS | Status: DC | PRN
  Filled 2015-01-31: qty 2
  Filled 2015-01-31: qty 20

## 2015-01-31 MED ORDER — WITCH HAZEL-GLYCERIN EX PADS
1.0000 | MEDICATED_PAD | CUTANEOUS | Status: DC | PRN
Start: 2015-01-31 — End: 2015-02-02

## 2015-01-31 MED ORDER — FLEET ENEMA 7-19 GM/118ML RE ENEM: 1.0000 | ENEMA | RECTAL | Status: DC | PRN

## 2015-01-31 MED ORDER — OXYCODONE-ACETAMINOPHEN 5-325 MG PO TABS: 1.0000 | ORAL_TABLET | ORAL | Status: DC | PRN

## 2015-01-31 MED ORDER — LIDOCAINE HCL (PF) 1 % IJ SOLN: 30.0000 mL | INTRAMUSCULAR | Status: DC | PRN

## 2015-01-31 MED ORDER — LACTATED RINGERS IV SOLN: INTRAVENOUS | Status: DC

## 2015-01-31 MED ORDER — ZOLPIDEM TARTRATE 5 MG PO TABS: 5.0000 mg | ORAL_TABLET | Freq: Every evening | ORAL | Status: DC | PRN

## 2015-01-31 MED ORDER — LANOLIN HYDROUS EX OINT: TOPICAL_OINTMENT | CUTANEOUS | Status: DC | PRN

## 2015-01-31 MED ORDER — IBUPROFEN 600 MG PO TABS
600.0000 mg | ORAL_TABLET | Freq: Four times a day (QID) | ORAL | Status: DC
  Administered 2015-01-31 – 2015-02-02 (×6): 600 mg via ORAL
  Filled 2015-01-31 (×6): qty 1

## 2015-01-31 MED ORDER — ACETAMINOPHEN 325 MG PO TABS: 650.0000 mg | ORAL_TABLET | ORAL | Status: DC | PRN

## 2015-01-31 MED ORDER — OXYTOCIN 40 UNITS IN LACTATED RINGERS INFUSION - SIMPLE MED
1.0000 m[IU]/min | INTRAVENOUS | Status: DC
  Administered 2015-01-31: 2 m[IU]/min via INTRAVENOUS

## 2015-01-31 MED ORDER — ONDANSETRON HCL 4 MG/2ML IJ SOLN: 4.0000 mg | INTRAMUSCULAR | Status: DC | PRN

## 2015-01-31 MED ORDER — ACETAMINOPHEN 325 MG PO TABS
650.0000 mg | ORAL_TABLET | ORAL | Status: DC | PRN
Start: 2015-01-31 — End: 2015-02-02
  Filled 2015-01-31: qty 2

## 2015-01-31 MED ORDER — ONDANSETRON HCL 4 MG/2ML IJ SOLN: 4.0000 mg | Freq: Four times a day (QID) | INTRAMUSCULAR | Status: DC | PRN

## 2015-01-31 MED ORDER — OXYTOCIN BOLUS FROM INFUSION: 500.0000 mL | INTRAVENOUS | Status: DC

## 2015-01-31 MED ORDER — DIPHENHYDRAMINE HCL 25 MG PO CAPS: 25.0000 mg | ORAL_CAPSULE | Freq: Four times a day (QID) | ORAL | Status: DC | PRN

## 2015-01-31 MED ORDER — LACTATED RINGERS IV SOLN
INTRAVENOUS | Status: DC
  Administered 2015-01-31 (×4): via INTRAVENOUS

## 2015-01-31 MED ORDER — LACTATED RINGERS IV SOLN
500.0000 mL | INTRAVENOUS | Status: DC | PRN
  Administered 2015-01-31 (×2): 500 mL via INTRAVENOUS

## 2015-01-31 MED ORDER — BENZOCAINE-MENTHOL 20-0.5 % EX AERO
1.0000 "application " | INHALATION_SPRAY | CUTANEOUS | Status: DC | PRN
  Administered 2015-01-31: 1 via TOPICAL
  Filled 2015-01-31: qty 56

## 2015-01-31 MED ORDER — FENTANYL 2.5 MCG/ML BUPIVACAINE 1/10 % EPIDURAL INFUSION (WH - ANES)
14.0000 mL/h | INTRAMUSCULAR | Status: DC | PRN
  Filled 2015-01-31 (×3): qty 125

## 2015-01-31 MED ORDER — EPHEDRINE 5 MG/ML INJ
10.0000 mg | INTRAVENOUS | Status: DC | PRN
  Filled 2015-01-31: qty 2

## 2015-01-31 NOTE — Progress Notes (Signed)
Labor Progress  Subjective:  very comfortable with the epidural.  Decline AROM  Objective: BP 127/78 mmHg  Pulse 107  Temp(Src) 97.6 F (36.4 C) (Axillary)  Resp 19  Ht 5\' 6"  (1.676 m)  Wt 199 lb (90.266 kg)  BMI 32.13 kg/m2  SpO2 99%  LMP 02/27/2014 I/O last 3 completed shifts: In: -  Out: 325 [Urine:325] Total I/O In: -  Out: 750 [Urine:750] FHT: 125, moderate variability, + accel, no decel CTX:  regular, every 4-5 minutes Uterus gravid, soft non tender SVE:  9/C/+1 BBW   Assessment:  IUP at 40.4 weeks NICHD: Category 1 Membranes:  BBW Labor progress: Iadquate labor GBS: negative   Plan: Continue labor plan Continuous monitoring Rest Frequent position changes to facilitate fetal rotation and descent. Will reassess with cervical exam at 1200 or earlier if necessary       Norma [name], CNM, MSN 01/31/2015. 12:26 PM

## 2015-01-31 NOTE — Anesthesia Preprocedure Evaluation (Signed)
Anesthesia Evaluation  Patient identified by MRN, date of birth, ID band Patient awake    Reviewed: Allergy & Precautions, Patient's Chart, lab work & pertinent test results  Airway Mallampati: II  TM Distance: >3 FB Neck ROM: Full    Dental no notable dental hx. (+) Teeth Intact   Pulmonary asthma ,  breath sounds clear to auscultation  Pulmonary exam normal       Cardiovascular negative cardio ROS Normal cardiovascular examRhythm:Regular Rate:Normal     Neuro/Psych PSYCHIATRIC DISORDERS negative neurological ROS     GI/Hepatic negative GI ROS, Neg liver ROS,   Endo/Other  Obesity  Renal/GU Renal diseaseHx/o renal calculi  negative genitourinary   Musculoskeletal negative musculoskeletal ROS (+)   Abdominal (+) + obese,   Peds  Hematology  (+) anemia ,   Anesthesia Other Findings   Reproductive/Obstetrics (+) Pregnancy                             Anesthesia Physical Anesthesia Plan  ASA: II  Anesthesia Plan: Epidural   Post-op Pain Management:    Induction:   Airway Management Planned: Natural Airway  Additional Equipment:   Intra-op Plan:   Post-operative Plan:   Informed Consent: I have reviewed the patients History and Physical, chart, labs and discussed the procedure including the risks, benefits and alternatives for the proposed anesthesia with the patient or authorized representative who has indicated his/her understanding and acceptance.     Plan Discussed with: Anesthesiologist  Anesthesia Plan Comments:         Anesthesia Quick Evaluation

## 2015-01-31 NOTE — Progress Notes (Addendum)
  Subjective: In to check on pt. Out of tub for cervical exam. Pain too much to endure for continued hydrotherapy/waterbirth. Exhausted -- has not had a good night's rest in a while. Requests epidural. Doula, FOB, pt's mother and mother-in-law present and supportive.  Objective: BP 137/90 mmHg  Pulse 98  Temp(Src) 99.5 F (37.5 C) (Oral)  Resp 18  Ht 5\' 6"  (1.676 m)  Wt 90.266 kg (199 lb)  BMI 32.13 kg/m2  LMP 02/27/2014     Today's Vitals   01/30/15 2213 01/30/15 2332 01/31/15 0255 01/31/15 0301  BP: 136/74  137/90   Pulse: 128  133 98  Temp: 99.3 F (37.4 C) 98.6 F (37 C) 99.5 F (37.5 C)   TempSrc: Oral Oral Oral   Resp: 18 18 18    Height: 5\' 6"  (1.676 m)     Weight: 90.266 kg (199 lb)     PainSc:       FHT: BL 145 w/ moderate variability, no accels, +mild-mod variables, earlys, no lates - improving w/ intrauterine resuscitative measures UC:   irregular, every 4-5 minutes SVE:   Dilation: 5 Effacement (%): 70 Station: -2 Exam by:: K. Arabia Nylund CNM BBOW, cvx soft  Assessment:  IUP at 40.4 wks No cervical change 1st stage labor/latent phase Cat 1 FHRT (intermittent monitoring) Maternal tachycardia Tmax 99.5 Cat 2 - reverted to Cat 1 w/ intrauterine resuscitative measures  Plan: Anesthesia for epidural consultation. Discussed minimal cervical change and recommendation for AROM or Pitocin augmentation. Advised could wait until comfortable w/ epidural to decide. Since BBOW and vtx well applied to cvx, recommend amniotomy and Pitocin. Much discussion taking place with family and doula in regards to next course of action. Will re-address/re-evaluate when comfortable. Pt in agreement. Consider abx and/or Tyl if temp or maternal pulse continues to be an issue.   Norma [name], Norma [name] CNM 01/31/2015, 3:50 AM

## 2015-01-31 NOTE — Progress Notes (Signed)
Labor Progress  Subjective: Starting to feel pain and breathing with each ctx, more pain on the right side  Objective: BP 120/75 mmHg  Pulse 96  Temp(Src) 99.2 F (37.3 C) (Oral)  Resp 20  Ht 5\' 6"  (1.676 m)  Wt 199 lb (90.266 kg)  BMI 32.13 kg/m2  SpO2 99%  LMP 02/27/2014 I/O last 3 completed shifts: In: -  Out: 325 [Urine:325] Total I/O In: -  Out: 750 [Urine:750] FHT: 125, moderate variability, +accel, early decels CTX:  regular, every 6 minutes Uterus gravid, soft non tender SVE:  Dilation: Lip/rim (on right side) Effacement (%): 100 Station: +1 Exam by:: vstandard,cnm   Assessment:  IUP at 40.4 weeks Membranes:  AROM  x 2hrs, no s/s of infection Labor progress: adquate labor GBS: negative Pt put in right sims position   Plan: Continue labor plan Continuous monitoring Frequent position changes to facilitate fetal rotation and descent. Will reassess with cervical exam at 1400 or earlier if necessary      Norma [name], CNM, MSN 01/31/2015. 1:16 PM

## 2015-01-31 NOTE — Anesthesia Procedure Notes (Signed)
Epidural Patient location during procedure: OB Start time: 01/31/2015 4:47 AM  Staffing Anesthesiologist: Mal AmabileFOSTER, Dorsie Burich Performed by: anesthesiologist   Preanesthetic Checklist Completed: patient identified, site marked, surgical consent, pre-op evaluation, timeout performed, IV checked, risks and benefits discussed and monitors and equipment checked  Epidural Patient position: sitting Prep: site prepped and draped and DuraPrep Patient monitoring: continuous pulse ox and blood pressure Approach: midline Location: L3-L4 Injection technique: LOR air  Needle:  Needle type: Tuohy  Needle gauge: 17 G Needle length: 9 cm and 9 Needle insertion depth: 5 cm cm Catheter type: closed end flexible Catheter size: 19 Gauge Catheter at skin depth: 10 cm Test dose: negative and Other  Assessment Events: blood not aspirated, injection not painful, no injection resistance, negative IV test and no paresthesia  Additional Notes Patient identified. Risks and benefits discussed including failed block, incomplete  Pain control, post dural puncture headache, nerve damage, paralysis, blood pressure Changes, nausea, vomiting, reactions to medications-both toxic and allergic and post Partum back pain. All questions were answered. Patient expressed understanding and wished to proceed. Sterile technique was used throughout procedure. Epidural site was Dressed with sterile barrier dressing. No paresthesias, signs of intravascular injection Or signs of intrathecal spread were encountered.  Patient was more comfortable after the epidural was dosed. Please see RN's note for documentation of vital signs and FHR which are stable.]

## 2015-01-31 NOTE — Progress Notes (Addendum)
  Subjective: Pool disassembled. Epidural placed at 04:47 AM; pt now comfortable. On left side sleeping, yet easily aroused. Only FOB and pt in room.  Objective: BP 108/61 mmHg  Pulse 96  Temp(Src) 99 F (37.2 C) (Oral)  Resp 16  Ht 5\' 6"  (1.676 m)  Wt 90.266 kg (199 lb)  BMI 32.13 kg/m2  SpO2 99%  LMP 02/27/2014     Today's Vitals   01/31/15 0521 01/31/15 0523 01/31/15 0526 01/31/15 0531  BP: 136/78  111/62 108/61  Pulse: 100 99 96 96  Temp:      TempSrc:      Resp: 16  16 16   Height:      Weight:      SpO2:      PainSc:       FHT: BL 125 w/ moderate variability, +accels, earlys, no lates UC:   irregular, every 4-7 minutes SVE: Deferred     Assessment:  IUP at 40.4 ws GBS neg ?LGA infant   Plan: Couple desires to hold off on Pitocin and amniotomy at present - wants to give baby every opportunity to revert to "normal position" - I explained that her cervical exams have not been that straightforward, esp enough to determine "malpresentation. I do feel that fetus is LGA.  Explained that without some form of augmentation, process may take longer. Re-evaluate at 06:30 AM, sooner if indicated.  Sherre ScarletKimberly Nabil Bubolz, CNM 01/31/15, 06:04 AM

## 2015-01-31 NOTE — Progress Notes (Addendum)
Labor Progress  Subjective: No complaints, no concerns, agreed to AROM  Objective: BP 127/78 mmHg  Pulse 107  Temp(Src) 97.6 F (36.4 C) (Axillary)  Resp 19  Ht 5\' 6"  (1.676 m)  Wt 199 lb (90.266 kg)  BMI 32.13 kg/m2  SpO2 99%  LMP 02/27/2014 I/O last 3 completed shifts: In: -  Out: 325 [Urine:325] Total I/O In: -  Out: 750 [Urine:750] FHT: 130, moderate variablity, + accel, no decel CTX:  regular, every 4-6 minutes Uterus gravid, soft non tender SVE:  9/100/+1  Assessment:  IUP at 40.4 weeks NICHD: Category 1 Membranes:  AROM 1045 light mec Labor progress: adquate labor GBS: negative    Plan: Continue labor plan Continuous monitoring Frequent position changes to facilitate fetal rotation and descent. Will reassess with cervical exam at 1300 or earlier if necessary       Norma [name], CNM, MSN 01/31/2015. 12:30 PM

## 2015-02-01 LAB — CBC
HCT: 36 % (ref 36.0–46.0)
Hemoglobin: 12.4 g/dL (ref 12.0–15.0)
MCH: 33.6 pg (ref 26.0–34.0)
MCHC: 34.4 g/dL (ref 30.0–36.0)
MCV: 97.6 fL (ref 78.0–100.0)
PLATELETS: 212 10*3/uL (ref 150–400)
RBC: 3.69 MIL/uL — AB (ref 3.87–5.11)
RDW: 13.9 % (ref 11.5–15.5)
WBC: 14.2 10*3/uL — AB (ref 4.0–10.5)

## 2015-02-01 NOTE — Lactation Note (Signed)
This note was copied from the chart of Norma [name]. Lactation Consultation Note  Initial visit made.  Breastfeeding consultation services and support information given to patient.  Mom states breastfeeding started out slow but baby is now showing more interest.  Encouraged to call with concerns or latch assist prn.  Patient Name: Norma [name] ACZYS'AToday's Date: 02/01/2015 Reason for consult: Initial assessment   Maternal Data    Feeding Feeding Type: Breast Fed  LATCH Score/Interventions Latch: Repeated attempts needed to sustain latch, nipple held in mouth throughout feeding, stimulation needed to elicit sucking reflex.  Audible Swallowing: A few with stimulation  Type of Nipple: Everted at rest and after stimulation (short shaft)  Comfort (Breast/Nipple): Soft / non-tender     Hold (Positioning): Assistance needed to correctly position infant at breast and maintain latch. (work on depth)  LATCH Score: 7  Lactation Tools Discussed/Used     Consult Status Consult Status: Follow-up Date: 02/02/15 Follow-up type: In-patient    Huston FoleyMOULDEN, Lam Mccubbins S 02/01/2015, 3:20 PM

## 2015-02-01 NOTE — Progress Notes (Signed)
Norma SessionsHaley L [name]   Subjective: Post Partum Day 1 Vaginal delivery, 2 degree laceration Patient up ad lib, denies syncope or dizziness. Reports consuming regular diet without issues and denies N/V No issues with urination and reports bleeding is appropriate  Feeding:  breast Contraceptive plan:   NFP   Objective: Temp:  [97.6 F (36.4 C)-99.2 F (37.3 C)] 98.4 F (36.9 C) (05/21 0723) Pulse Rate:  [96-142] 102 (05/21 0723) Resp:  [18-22] 18 (05/21 0723) BP: (79-142)/(54-93) 96/70 mmHg (05/21 0723) SpO2:  [96 %-100 %] 96 % (05/21 0723)  Physical Exam:  General: alert and cooperative Ext: WNL, no edema. No evidence of DVT seen on physical exam. Breast: Soft filling Lungs: CTAB Heart RRR without murmur  Abdomen:  Soft, fundus firm, lochia scant, + bowel sounds, non distended, non tender Lochia: appropriate Uterine Fundus: firm Laceration: healing well    Recent Labs  01/30/15 2123 02/01/15 0609  HGB 14.3 12.4  HCT 40.1 36.0    Assessment S/P Vaginal Delivery-Day 1 Stable  Normal Involution Breastfeeding Circumcision: out patient  Plan: Continue current care Plan for discharge tomorrow, Breastfeeding and Lactation consult Lactation support   Harlee Pursifull, CNM, MSN 02/01/2015, 8:55 AM

## 2015-02-01 NOTE — Anesthesia Postprocedure Evaluation (Signed)
  Anesthesia Post-op Note  Patient: Norma [name]  Procedure(s) Performed: * No procedures listed *  Patient Location: Mother/Baby  Anesthesia Type:Epidural  Level of Consciousness: awake and alert   Airway and Oxygen Therapy: Patient Spontanous Breathing  Post-op Pain: mild  Post-op Assessment: Post-op Vital signs reviewed, Patient's Cardiovascular Status Stable, Respiratory Function Stable, No signs of Nausea or vomiting, Pain level controlled, No headache, No residual numbness and No residual motor weakness  Post-op Vital Signs: Reviewed  Last Vitals:  Filed Vitals:   02/01/15 0723  BP: 96/70  Pulse: 102  Temp: 36.9 C  Resp: 18    Complications: No apparent anesthesia complications

## 2015-02-02 MED ORDER — IBUPROFEN 600 MG PO TABS: 600.0000 mg | ORAL_TABLET | Freq: Four times a day (QID) | ORAL | Status: DC | PRN

## 2015-02-02 NOTE — Discharge Summary (Signed)
Vaginal Delivery Discharge Summary  Norma [name]  DOB:    12/09/1990 MRN:    [id number] CSN:    [id number]  Date of admission:                  01/30/15  Date of discharge:                   02/02/15  Procedures this admission:   VAVD, repair of 2nd degree laceration  Date of Delivery: 01/31/15  Newborn Data:  Live born female  Birth Weight: 8 lb 2 oz (3685 g) APGARS: 9, 9  Home with mother. Name: Kelvin CellarLuke Christopher Circumcision Plan: Outpatient  History of Present Illness:  Ms. Norma [name] is a 24 y.o. female, G1P1001, who presents at 5328w3d weeks gestation. [facility]. She was admitted for onset of labor. Her pregnancy has been complicated by:  Patient Active Problem List   Diagnosis Date Noted  . Vacuum extractor delivery, delivered 01/31/2015  . 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 Vitamin D deficiency 01/30/2015  . ADHD (attention deficit hyperactivity disorder) 01/30/2015  . Exercise-induced asthma 01/30/2015  . Tailbone injury - [employer] school 01/30/2015  . Calculus of kidney - passed spontaneously in Jul 2015 01/30/2015  . History of female infertility 01/30/2015  . BMI 32.0-32.9,adult 01/30/2015   Hospital Course:  The patient was admitted in latent labor. Negative GBS. Planned WB initially, but after slow progression and maternal exhaustion, she consented to epidural for pain management; however, declined Pitocin and amniotomy. Her delivery was complicated by fetal bradycardia resulting in vacuum delivery, performed by Dr. Estanislado Pandyivard without complications. Patient and baby tolerated the procedure without difficulty, with 2nd degree laceration noted. Infant status was good and remained in room with mother.  Mother and infant then had an uncomplicated postpartum course, with breastfeeding going well. Mom's physical exam was WNL, and she was discharged home in  stable condition. Contraception plan was NFP. She received adequate benefit from Ibuprofen only.   Feeding:  breast  Contraception:  natural family planning (NFP)  Hemoglobin Results: CBC Latest Ref Rng 02/01/2015 01/30/2015 03/18/2014  WBC 4.0 - 10.5 K/uL 14.2(H) 12.1(H) 5.4  Hemoglobin 12.0 - 15.0 g/dL 25.912.4 56.314.3 87.512.6  Hematocrit 36.0 - 46.0 % 36.0 40.1 37.5  Platelets 150 - 400 K/uL 212 197 203   B positive, Antibody screen neg  Rubella immune   Discharge Physical Exam:   General: alert Lochia: appropriate Uterine Fundus: firm Incision: healing well DVT Evaluation: No evidence of DVT seen on physical exam. Negative Homan's sign.  Intrapartum Procedures: vacuum assisted vaginal delivery Postpartum Procedures: none Complications-Operative and Postpartum: 2nd degree perineal laceration  Discharge Diagnoses: Term Pregnancy-delivered  Discharge Information:  Activity:           pelvic rest Diet:                routine Medications: Ibuprofen, PNVs Condition:      stable    Discharge to: home  Follow-up Information    [facility] In 6 weeks.   Specialty:  Obstetrics and Gynecology   Why:  Call for any questions or concerns.   Contact information:   [address] [place] [phone]       Sherre ScarletWILLIAMS, Estefan Pattison CNM 02/02/2015, 08:40 AM

## 2015-02-02 NOTE — Lactation Note (Signed)
This note was copied from the chart of Norma [name] Kita. Lactation Consultation Note  Patient Name: Norma [name] Charland ZOXWR'UToday's Date: 02/02/2015   Visited with Mom on day of discharge, baby 2340 hrs old.  Mom states breastfeeding is going well, no nipple soreness.  Encouraged skin to skin, and cue based feedings.  Engorgement prevention and treatment discussed.  Reminded her of OP lactation services available to her.  Encouraged to call prn.  Judee ClaraSmith, Kevontay Burks E 02/02/2015, 11:17 AM

## 2015-02-02 NOTE — Discharge Instructions (Signed)
Contraception Choices Birth control (contraception) is the use of any methods or devices to stop pregnancy from happening. Below are some methods to help avoid pregnancy. HORMONAL BIRTH CONTROL A small tube put under the skin of the upper arm (implant). The tube can stay in place for 3 years. The implant must be taken out after 3 years. Shots given every 3 months. Pills taken every day. Patches that are changed once a week. A ring put into the vagina (vaginal ring). The ring is left in place for 3 weeks and removed for 1 week. Then, a new ring is put in the vagina. Emergency birth control pills taken after unprotected sex (intercourse). BARRIER BIRTH CONTROL  A thin covering worn on the penis (female condom) during sex. A soft, loose covering put into the vagina (female condom) before sex. A rubber bowl that sits over the cervix (diaphragm). The bowl must be made for you. The bowl is put into the vagina before sex. The bowl is left in place for 6 to 8 hours after sex. A small, soft cup that fits over the cervix (cervical cap). The cup must be made for you. The cup can be left in place for 48 hours after sex. A sponge that is put into the vagina before sex. A chemical that kills or stops sperm from getting into the cervix and uterus (spermicide). The chemical may be a cream, jelly, foam, or pill. INTRAUTERINE (IUD) BIRTH CONTROL  IUD birth control is a small, T-shaped piece of plastic. The plastic is put inside the uterus. There are 2 types of IUD: Copper IUD. The IUD is covered in copper wire. The copper makes a fluid that kills sperm. It can stay in place for 10 years. Hormone IUD. The hormone stops pregnancy from happening. It can stay in place for 5 years. PERMANENT METHODS When the woman has her fallopian tubes sealed, tied, or blocked during surgery. This stops the egg from traveling to the uterus. The doctor places a small coil or insert into each fallopian tube. This causes scar tissue to  form and blocks the fallopian tubes. When the female has the tubes that carry sperm tied off (vasectomy). NATURAL FAMILY PLANNING BIRTH CONTROL  Natural family planning means not having sex or using barrier birth control on the days the woman could become pregnant. Use a calendar to keep track of the length of each period and know the days she can get pregnant. Avoid sex during ovulation. Use a thermometer to measure body temperature. Also watch for symptoms of ovulation. Time sex to be after the woman has ovulated. Use condoms to help protect yourself against sexually transmitted infections (STIs). Do this no matter what type of birth control you use. Talk to your doctor about which type of birth control is best for you. Document Released: 06/27/2009 Document Revised: 09/04/2013 Document Reviewed: 03/21/2013 [facility] Patient Information 2015 [place], [place]. This information is not intended to replace advice given to you by your health care provider. Make sure you discuss any questions you have with your health care provider. Breastfeeding Deciding to breastfeed is one of the best choices you can make for you and your baby. A change in hormones during pregnancy causes your breast tissue to grow and increases the number and size of your milk ducts. These hormones also allow proteins, sugars, and fats from your blood supply to make breast milk in your milk-producing glands. Hormones prevent breast milk from being released before your baby is born as well  as prompt milk flow after birth. Once breastfeeding has begun, thoughts of your baby, as well as his or her sucking or crying, can stimulate the release of milk from your milk-producing glands.  BENEFITS OF BREASTFEEDING For Your Baby  Your first milk (colostrum) helps your baby's digestive system function better.   There are antibodies in your milk that help your baby fight off infections.   Your baby has a lower incidence of asthma, allergies,  and sudden infant death syndrome.   The nutrients in breast milk are better for your baby than infant formulas and are designed uniquely for your baby's needs.   Breast milk improves your baby's brain development.   Your baby is less likely to develop other conditions, such as childhood obesity, asthma, or type 2 diabetes mellitus.  For You   Breastfeeding helps to create a very special bond between you and your baby.   Breastfeeding is convenient. Breast milk is always available at the correct temperature and costs nothing.   Breastfeeding helps to burn calories and helps you lose the weight gained during pregnancy.   Breastfeeding makes your uterus contract to its prepregnancy size faster and slows bleeding (lochia) after you give birth.   Breastfeeding helps to lower your risk of developing type 2 diabetes mellitus, osteoporosis, and breast or ovarian cancer later in life. SIGNS THAT YOUR BABY IS HUNGRY Early Signs of Hunger  Increased alertness or activity.  Stretching.  Movement of the head from side to side.  Movement of the head and opening of the mouth when the corner of the mouth or cheek is stroked (rooting).  Increased sucking sounds, smacking lips, cooing, sighing, or squeaking.  Hand-to-mouth movements.  Increased sucking of fingers or hands. Late Signs of Hunger  Fussing.  Intermittent crying. Extreme Signs of Hunger Signs of extreme hunger will require calming and consoling before your baby will be able to breastfeed successfully. Do not wait for the following signs of extreme hunger to occur before you initiate breastfeeding:   Restlessness.  A loud, strong cry.   Screaming. BREASTFEEDING BASICS Breastfeeding Initiation  Find a comfortable place to sit or lie down, with your neck and back well supported.  Place a pillow or rolled up blanket under your baby to bring him or her to the level of your breast (if you are seated). Nursing pillows  are specially designed to help support your arms and your baby while you breastfeed.  Make sure that your baby's abdomen is facing your abdomen.   Gently massage your breast. With your fingertips, massage from your chest wall toward your nipple in a circular motion. This encourages milk flow. You may need to continue this action during the feeding if your milk flows slowly.  Support your breast with 4 fingers underneath and your thumb above your nipple. Make sure your fingers are well away from your nipple and your baby's mouth.   Stroke your baby's lips gently with your finger or nipple.   When your baby's mouth is open wide enough, quickly bring your baby to your breast, placing your entire nipple and as much of the colored area around your nipple (areola) as possible into your baby's mouth.   More areola should be visible above your baby's upper lip than below the lower lip.   Your baby's tongue should be between his or her lower gum and your breast.   Ensure that your baby's mouth is correctly positioned around your nipple (latched). Your baby's lips should  create a seal on your breast and be turned out (everted).  It is common for your baby to suck about 2-3 minutes in order to start the flow of breast milk. Latching Teaching your baby how to latch on to your breast properly is very important. An improper latch can cause nipple pain and decreased milk supply for you and poor weight gain in your baby. Also, if your baby is not latched onto your nipple properly, he or she may swallow some air during feeding. This can make your baby fussy. Burping your baby when you switch breasts during the feeding can help to get rid of the air. However, teaching your baby to latch on properly is still the best way to prevent fussiness from swallowing air while breastfeeding. Signs that your baby has successfully latched on to your nipple:    Silent tugging or silent sucking, without causing you  pain.   Swallowing heard between every 3-4 sucks.    Muscle movement above and in front of his or her ears while sucking.  Signs that your baby has not successfully latched on to nipple:   Sucking sounds or smacking sounds from your baby while breastfeeding.  Nipple pain. If you think your baby has not latched on correctly, slip your finger into the corner of your baby's mouth to break the suction and place it between your baby's gums. Attempt breastfeeding initiation again. Signs of Successful Breastfeeding Signs from your baby:   A gradual decrease in the number of sucks or complete cessation of sucking.   Falling asleep.   Relaxation of his or her body.   Retention of a small amount of milk in his or her mouth.   Letting go of your breast by himself or herself. Signs from you:  Breasts that have increased in firmness, weight, and size 1-3 hours after feeding.   Breasts that are softer immediately after breastfeeding.  Increased milk volume, as well as a change in milk consistency and color by the fifth day of breastfeeding.   Nipples that are not sore, cracked, or bleeding. Signs That Your Pecola Leisure is Getting Enough Milk  Wetting at least 3 diapers in a 24-hour period. The urine should be clear and pale yellow by age 11 days.  At least 3 stools in a 24-hour period by age 11 days. The stool should be soft and yellow.  At least 3 stools in a 24-hour period by age 75 days. The stool should be seedy and yellow.  No loss of weight greater than 10% of birth weight during the first 75 days of age.  Average weight gain of 4-7 ounces (113-198 g) per week after age 54 days.  Consistent daily weight gain by age 11 days, without weight loss after the age of 2 weeks. After a feeding, your baby may spit up a small amount. This is common. BREASTFEEDING FREQUENCY AND DURATION Frequent feeding will help you make more milk and can prevent sore nipples and breast engorgement.  Breastfeed when you feel the need to reduce the fullness of your breasts or when your baby shows signs of hunger. This is called "breastfeeding on demand." Avoid introducing a pacifier to your baby while you are working to establish breastfeeding (the first 4-6 weeks after your baby is born). After this time you may choose to use a pacifier. Research has shown that pacifier use during the first year of a baby's life decreases the risk of sudden infant death syndrome (SIDS). Allow your baby  to feed on each breast as long as he or she wants. Breastfeed until your baby is finished feeding. When your baby unlatches or falls asleep while feeding from the first breast, offer the second breast. Because newborns are often sleepy in the first few weeks of life, you may need to awaken your baby to get him or her to feed. Breastfeeding times will vary from baby to baby. However, the following rules can serve as a guide to help you ensure that your baby is properly fed:  Newborns (babies 3 weeks of age or younger) may breastfeed every 1-3 hours.  Newborns should not go longer than 3 hours during the day or 5 hours during the night without breastfeeding.  You should breastfeed your baby a minimum of 8 times in a 24-hour period until you begin to introduce solid foods to your baby at around 28 months of age. BREAST MILK PUMPING Pumping and storing breast milk allows you to ensure that your baby is exclusively fed your breast milk, even at times when you are unable to breastfeed. This is especially important if you are going back to work while you are still breastfeeding or when you are not able to be present during feedings. Your lactation consultant can give you guidelines on how long it is safe to store breast milk.  A breast pump is a machine that allows you to pump milk from your breast into a sterile bottle. The pumped breast milk can then be stored in a refrigerator or freezer. Some breast pumps are operated by  hand, while others use electricity. Ask your lactation consultant which type will work best for you. Breast pumps can be purchased, but some hospitals and breastfeeding support groups lease breast pumps on a monthly basis. A lactation consultant can teach you how to hand express breast milk, if you prefer not to use a pump.  CARING FOR YOUR BREASTS WHILE YOU BREASTFEED Nipples can become dry, cracked, and sore while breastfeeding. The following recommendations can help keep your breasts moisturized and healthy:  Avoid using soap on your nipples.   Wear a supportive bra. Although not required, special nursing bras and tank tops are designed to allow access to your breasts for breastfeeding without taking off your entire bra or top. Avoid wearing underwire-style bras or extremely tight bras.  Air dry your nipples for 3-76minutes after each feeding.   Use only cotton bra pads to absorb leaked breast milk. Leaking of breast milk between feedings is normal.   Use lanolin on your nipples after breastfeeding. Lanolin helps to maintain your skin's normal moisture barrier. If you use pure lanolin, you do not need to wash it off before feeding your baby again. Pure lanolin is not toxic to your baby. You may also hand express a few drops of breast milk and gently massage that milk into your nipples and allow the milk to air dry. In the first few weeks after giving birth, some women experience extremely full breasts (engorgement). Engorgement can make your breasts feel heavy, warm, and tender to the touch. Engorgement peaks within 3-5 days after you give birth. The following recommendations can help ease engorgement:  Completely empty your breasts while breastfeeding or pumping. You may want to start by applying warm, moist heat (in the shower or with warm water-soaked hand towels) just before feeding or pumping. This increases circulation and helps the milk flow. If your baby does not completely empty your  breasts while breastfeeding, pump any extra milk  after he or she is finished.  Wear a snug bra (nursing or regular) or tank top for 1-2 days to signal your body to slightly decrease milk production.  Apply ice packs to your breasts, unless this is too uncomfortable for you.  Make sure that your baby is latched on and positioned properly while breastfeeding. If engorgement persists after 48 hours of following these recommendations, contact your health care provider or a Advertising copywriter. OVERALL HEALTH CARE RECOMMENDATIONS WHILE BREASTFEEDING  Eat healthy foods. Alternate between meals and snacks, eating 3 of each per day. Because what you eat affects your breast milk, some of the foods may make your baby more irritable than usual. Avoid eating these foods if you are sure that they are negatively affecting your baby.  Drink milk, fruit juice, and water to satisfy your thirst (about 10 glasses a day).   Rest often, relax, and continue to take your prenatal vitamins to prevent fatigue, stress, and anemia.  Continue breast self-awareness checks.  Avoid chewing and smoking tobacco.  Avoid alcohol and drug use. Some medicines that may be harmful to your baby can pass through breast milk. It is important to ask your health care provider before taking any medicine, including all over-the-counter and prescription medicine as well as vitamin and herbal supplements. It is possible to become pregnant while breastfeeding. If birth control is desired, ask your health care provider about options that will be safe for your baby. SEEK MEDICAL CARE IF:   You feel like you want to stop breastfeeding or have become frustrated with breastfeeding.  You have painful breasts or nipples.  Your nipples are cracked or bleeding.  Your breasts are red, tender, or warm.  You have a swollen area on either breast.  You have a fever or chills.  You have nausea or vomiting.  You have drainage other than  breast milk from your nipples.  Your breasts do not become full before feedings by the fifth day after you give birth.  You feel sad and depressed.  Your baby is too sleepy to eat well.  Your baby is having trouble sleeping.   Your baby is wetting less than 3 diapers in a 24-hour period.  Your baby has less than 3 stools in a 24-hour period.  Your baby's skin or the white part of his or her eyes becomes yellow.   Your baby is not gaining weight by 58 days of age. SEEK IMMEDIATE MEDICAL CARE IF:   Your baby is overly tired (lethargic) and does not want to wake up and feed.  Your baby develops an unexplained fever. Document Released: 08/30/2005 Document Revised: 09/04/2013 Document Reviewed: 02/21/2013 [facility] Patient Information 2015 [place], [place]. This information is not intended to replace advice given to you by your health care provider. Make sure you discuss any questions you have with your health care provider. Postpartum Depression and Baby Blues The postpartum period begins right after the birth of a baby. During this time, there is often a great amount of joy and excitement. It is also a time of many changes in the life of the parents. Regardless of how many times a mother gives birth, each child brings new challenges and dynamics to the family. It is not unusual to have feelings of excitement along with confusing shifts in moods, emotions, and thoughts. All mothers are at risk of developing postpartum depression or the "baby blues." These mood changes can occur right after giving birth, or they may occur many months  after giving birth. The baby blues or postpartum depression can be mild or severe. Additionally, postpartum depression can go away rather quickly, or it can be a long-term condition.  CAUSES Raised hormone levels and the rapid drop in those levels are thought to be a main cause of postpartum depression and the baby blues. A number of hormones change during and after  pregnancy. Estrogen and progesterone usually decrease right after the delivery of your baby. The levels of thyroid hormone and various cortisol steroids also rapidly drop. Other factors that play a role in these mood changes include major life events and genetics.  RISK FACTORS If you have any of the following risks for the baby blues or postpartum depression, know what symptoms to watch out for during the postpartum period. Risk factors that may increase the likelihood of getting the baby blues or postpartum depression include:  Having a personal or family history of depression.   Having depression while being pregnant.   Having premenstrual mood issues or mood issues related to oral contraceptives.  Having a lot of life stress.   Having marital conflict.   Lacking a social support network.   Having a baby with special needs.   Having health problems, such as diabetes.  SIGNS AND SYMPTOMS Symptoms of baby blues include:  Brief changes in mood, such as going from extreme happiness to sadness.  Decreased concentration.   Difficulty sleeping.   Crying spells, tearfulness.   Irritability.   Anxiety.  Symptoms of postpartum depression typically begin within the first month after giving birth. These symptoms include:  Difficulty sleeping or excessive sleepiness.   Marked weight loss.   Agitation.   Feelings of worthlessness.   Lack of interest in activity or food.  Postpartum psychosis is a very serious condition and can be dangerous. Fortunately, it is rare. Displaying any of the following symptoms is cause for immediate medical attention. Symptoms of postpartum psychosis include:   Hallucinations and delusions.   Bizarre or disorganized behavior.   Confusion or disorientation.  DIAGNOSIS  A diagnosis is made by an evaluation of your symptoms. There are no medical or lab tests that lead to a diagnosis, but there are various questionnaires that a  health care provider may use to identify those with the baby blues, postpartum depression, or psychosis. Often, a screening tool called the [place] Postnatal Depression Scale is used to diagnose depression in the postpartum period.  TREATMENT The baby blues usually goes away on its own in 1-2 weeks. Social support is often all that is needed. You will be encouraged to get adequate sleep and rest. Occasionally, you may be given medicines to help you sleep.  Postpartum depression requires treatment because it can last several months or longer if it is not treated. Treatment may include individual or group therapy, medicine, or both to address any social, physiological, and psychological factors that may play a role in the depression. Regular exercise, a healthy diet, rest, and social support may also be strongly recommended.  Postpartum psychosis is more serious and needs treatment right away. Hospitalization is often needed. HOME CARE INSTRUCTIONS  Get as much rest as you can. Nap when the baby sleeps.   Exercise regularly. Some women find yoga and walking to be beneficial.   Eat a balanced and nourishing diet.   Do little things that you enjoy. Have a cup of tea, take a bubble bath, read your favorite magazine, or listen to your favorite music.  Avoid alcohol.  Ask for help with household chores, cooking, grocery shopping, or running errands as needed. Do not try to do everything.   Talk to people close to you about how you are feeling. Get support from your partner, family members, friends, or other new moms.  Try to stay positive in how you think. Think about the things you are grateful for.   Do not spend a lot of time alone.   Only take over-the-counter or prescription medicine as directed by your health care provider.  Keep all your postpartum appointments.   Let your health care provider know if you have any concerns.  SEEK MEDICAL CARE IF: You are having a reaction  to or problems with your medicine. SEEK IMMEDIATE MEDICAL CARE IF:  You have suicidal feelings.   You think you may harm the baby or someone else. MAKE SURE YOU:  Understand these instructions.  Will watch your condition.  Will get help right away if you are not doing well or get worse. Document Released: 06/03/2004 Document Revised: 09/04/2013 Document Reviewed: 06/11/2013 [facility] Patient Information 2015 [place], [place]. This information is not intended to replace advice given to you by your health care provider. Make sure you discuss any questions you have with your health care provider. Postpartum Care After Vaginal Delivery After you deliver your newborn (postpartum period), the usual stay in the hospital is 24-72 hours. If there were problems with your labor or delivery, or if you have other medical problems, you might be in the hospital longer.  While you are in the hospital, you will receive help and instructions on how to care for yourself and your newborn during the postpartum period.  While you are in the hospital:  Be sure to tell your nurses if you have pain or discomfort, as well as where you feel the pain and what makes the pain worse.  If you had an incision made near your vagina (episiotomy) or if you had some tearing during delivery, the nurses may put ice packs on your episiotomy or tear. The ice packs may help to reduce the pain and swelling.  If you are breastfeeding, you may feel uncomfortable contractions of your uterus for a couple of weeks. This is normal. The contractions help your uterus get back to normal size.  It is normal to have some bleeding after delivery.  For the first 1-3 days after delivery, the flow is red and the amount may be similar to a period.  It is common for the flow to start and stop.  In the first few days, you may pass some small clots. Let your nurses know if you begin to pass large clots or your flow increases.  Do not  flush  blood clots down the toilet before having the nurse look at them.  During the next 3-10 days after delivery, your flow should become more watery and pink or brown-tinged in color.  Ten to fourteen days after delivery, your flow should be a small amount of yellowish-white discharge.  The amount of your flow will decrease over the first few weeks after delivery. Your flow may stop in 6-8 weeks. Most women have had their flow stop by 12 weeks after delivery.  You should change your sanitary pads frequently.  Wash your hands thoroughly with soap and water for at least 20 seconds after changing pads, using the toilet, or before holding or feeding your newborn.  You should feel like you need to empty your bladder within the first 6-8  hours after delivery.  In case you become weak, lightheaded, or faint, call your nurse before you get out of bed for the first time and before you take a shower for the first time.  Within the first few days after delivery, your breasts may begin to feel tender and full. This is called engorgement. Breast tenderness usually goes away within 48-72 hours after engorgement occurs. You may also notice milk leaking from your breasts. If you are not breastfeeding, do not stimulate your breasts. Breast stimulation can make your breasts produce more milk.  Spending as much time as possible with your newborn is very important. During this time, you and your newborn can feel close and get to know each other. Having your newborn stay in your room (rooming in) will help to strengthen the bond with your newborn. It will give you time to get to know your newborn and become comfortable caring for your newborn.  Your hormones change after delivery. Sometimes the hormone changes can temporarily cause you to feel sad or tearful. These feelings should not last more than a few days. If these feelings last longer than that, you should talk to your caregiver.  If desired, talk to your  caregiver about methods of family planning or contraception.  Talk to your caregiver about immunizations. Your caregiver may want you to have the following immunizations before leaving the hospital:  Tetanus, diphtheria, and pertussis (Tdap) or tetanus and diphtheria (Td) immunization. It is very important that you and your family (including grandparents) or others caring for your newborn are up-to-date with the Tdap or Td immunizations. The Tdap or Td immunization can help protect your newborn from getting ill.  Rubella immunization.  Varicella (chickenpox) immunization.  Influenza immunization. You should receive this annual immunization if you did not receive the immunization during your pregnancy. Document Released: 06/27/2007 Document Revised: 05/24/2012 Document Reviewed: 04/26/2012 [facility] Patient Information 2015 [place], [place]. This information is not intended to replace advice given to you by your health care provider. Make sure you discuss any questions you have with your health care provider.

## 2015-09-14 NOTE — L&D Delivery Note (Signed)
Delivery Note At 0705  a viable female was delivered via  (Presentation:LOA ; vtx ).  APGAR:9 ,9 ; weight pending  .   Placenta status: complete, . 3V Cord:  with the following complications:None .    Anesthesia:  Epideral Episiotomy:  None Lacerations:  2nd Suture Repair: 2.0 vicryl Est. Blood Loss (mL):  100cc  Mom to postpartum.  Baby to Couplet care / Skin to Skin.  Norma [name] 09/05/2016, 7:49 AM

## 2016-09-04 ENCOUNTER — Inpatient Hospital Stay (HOSPITAL_COMMUNITY)
Admission: AD | Admit: 2016-09-04 | Discharge: 2016-09-06 | DRG: 775 | Disposition: A | Discharge status: Home / Self Care | Payer: BLUE CROSS/BLUE SHIELD | Source: Ambulatory Visit | Attending: Obstetrics and Gynecology | Admitting: Obstetrics and Gynecology

## 2016-09-04 ENCOUNTER — Encounter (HOSPITAL_COMMUNITY): Payer: Self-pay | Admitting: *Deleted

## 2016-09-04 DIAGNOSIS — O99214 Obesity complicating childbirth: Secondary | ICD-10-CM | POA: Diagnosis present

## 2016-09-04 DIAGNOSIS — Z3A40 40 weeks gestation of pregnancy: Secondary | ICD-10-CM

## 2016-09-04 DIAGNOSIS — E669 Obesity, unspecified: Secondary | ICD-10-CM | POA: Diagnosis present

## 2016-09-04 DIAGNOSIS — Z6831 Body mass index (BMI) 31.0-31.9, adult: Secondary | ICD-10-CM | POA: Diagnosis not present

## 2016-09-04 DIAGNOSIS — B951 Streptococcus, group B, as the cause of diseases classified elsewhere: Secondary | ICD-10-CM

## 2016-09-04 DIAGNOSIS — O99824 Streptococcus B carrier state complicating childbirth: Secondary | ICD-10-CM | POA: Diagnosis present

## 2016-09-04 DIAGNOSIS — O4202 Full-term premature rupture of membranes, onset of labor within 24 hours of rupture: Secondary | ICD-10-CM | POA: Diagnosis present

## 2016-09-04 DIAGNOSIS — O09299 Supervision of pregnancy with other poor reproductive or obstetric history, unspecified trimester: Secondary | ICD-10-CM

## 2016-09-04 MED ORDER — LIDOCAINE HCL (PF) 1 % IJ SOLN
30.0000 mL | INTRAMUSCULAR | Status: DC | PRN
  Filled 2016-09-04: qty 30

## 2016-09-04 MED ORDER — PENICILLIN G POT IN DEXTROSE 60000 UNIT/ML IV SOLN
3.0000 10*6.[IU] | INTRAVENOUS | Status: DC
Start: 2016-09-05 — End: 2016-09-05
  Administered 2016-09-05: 3 10*6.[IU] via INTRAVENOUS
  Filled 2016-09-04 (×3): qty 50

## 2016-09-04 MED ORDER — ONDANSETRON HCL 4 MG/2ML IJ SOLN
4.0000 mg | Freq: Four times a day (QID) | INTRAMUSCULAR | Status: DC | PRN
  Administered 2016-09-05: 4 mg via INTRAVENOUS
  Filled 2016-09-04: qty 2

## 2016-09-04 MED ORDER — PENICILLIN G POTASSIUM 5000000 UNITS IJ SOLR
5.0000 10*6.[IU] | Freq: Once | INTRAMUSCULAR | Status: AC
  Administered 2016-09-05: 5 10*6.[IU] via INTRAVENOUS
  Filled 2016-09-04: qty 5

## 2016-09-04 MED ORDER — ACETAMINOPHEN 325 MG PO TABS: 650.0000 mg | ORAL_TABLET | ORAL | Status: DC | PRN

## 2016-09-04 MED ORDER — BUTORPHANOL TARTRATE 1 MG/ML IJ SOLN: 1.0000 mg | INTRAMUSCULAR | Status: DC | PRN

## 2016-09-04 MED ORDER — SOD CITRATE-CITRIC ACID 500-334 MG/5ML PO SOLN: 30.0000 mL | ORAL | Status: DC | PRN

## 2016-09-04 MED ORDER — OXYTOCIN 40 UNITS IN LACTATED RINGERS INFUSION - SIMPLE MED
2.5000 [IU]/h | INTRAVENOUS | Status: DC
  Administered 2016-09-05: 2.5 [IU]/h via INTRAVENOUS
  Filled 2016-09-04: qty 1000

## 2016-09-04 MED ORDER — OXYTOCIN BOLUS FROM INFUSION
500.0000 mL | Freq: Once | INTRAVENOUS | Status: AC
  Administered 2016-09-05: 500 mL via INTRAVENOUS

## 2016-09-04 MED ORDER — OXYCODONE-ACETAMINOPHEN 5-325 MG PO TABS: 1.0000 | ORAL_TABLET | ORAL | Status: DC | PRN

## 2016-09-04 MED ORDER — LACTATED RINGERS IV SOLN
INTRAVENOUS | Status: DC
  Administered 2016-09-04 – 2016-09-05 (×2): via INTRAVENOUS

## 2016-09-04 MED ORDER — OXYCODONE-ACETAMINOPHEN 5-325 MG PO TABS: 2.0000 | ORAL_TABLET | ORAL | Status: DC | PRN

## 2016-09-04 MED ORDER — FLEET ENEMA 7-19 GM/118ML RE ENEM: 1.0000 | ENEMA | RECTAL | Status: DC | PRN

## 2016-09-04 MED ORDER — LACTATED RINGERS IV SOLN
500.0000 mL | INTRAVENOUS | Status: DC | PRN
  Administered 2016-09-05: 250 mL via INTRAVENOUS

## 2016-09-04 NOTE — MAU Note (Signed)
Started leaking fld at 2230 that is yellow in color. Clothes and pad/towel are wet. Some ctxs since water broke but not bad pain.

## 2016-09-04 NOTE — H&P (Signed)
Norma [name] is a 25 y.o. female, G2P1001 @ 40.3 wks by 13.6 wk scan, presents w/ c/o ROM at 10:30 pm last evening, clear fluid. Reports irregular ctxs. Denies VB. Endorses FM. Was closed at last exam.  [facility]  Prenatal course complicated by:  1. H/O VAVD & 3rd degree in previous delivery  2) ADHD - no meds 3) H/O kidney stones in 2015 - passed spontaneously 4) H/O asthma - as a child and teen. No issues in recent years 5) GBS positive - NKDA   OB History    Gravida Para Term Preterm AB Living   2 1 1     1    SAB TAB Ectopic Multiple Live Births         0 1     VAVD (due to fetal bradycardia) on 01/31/2015 @ 40.4 wks, female infant, birthwt 8+2, 3rd degree lac, WHG  Past Medical History:  Diagnosis Date  . Anemia   . Asthma    exercise induced  . Dysmenorrhea   . Right ureteral stone    Past Surgical History:  Procedure Laterality Date  . TONSILLECTOMY    . WISDOM TOOTH EXTRACTION     Family History Disorder of thyroid gland: Mother, MGM. Hypertensive disorder: Mother, Maternal Grandmother and Maternal Grandfather. Diabetes mellitus: Mother, Maternal Grandfather. Myocardial infarction: Mother. Cerebrovascular accident: Maternal Grandmother. Hypercholesterolemia: Maternal Grandmother. Dementia: Maternal Grandmother. Heart disease: Maternal Grandfather, Paternal Grandmother. Malignant melanoma of skin: Maternal Grandfather. Heart failure: Mother Social History:  reports that she has never smoked. She has never used smokeless tobacco. She reports that she drinks alcohol. She reports that she does not use drugs.     Maternal Diabetes: No Genetic Screening: Declined Maternal Ultrasounds/Referrals: Normal Fetal Ultrasounds or other Referrals:  None Maternal Substance Abuse:  No Significant Maternal Medications:  Meds include: Other: PNV Significant Maternal Lab Results:  Lab values include: Group B Strep positive  Other Comments:   Received both Tdap and flu on 06/21/16   ROS 10 Systems reviewed and are negative for acute change except as noted in the HPI.  History   Exam  Dilation: 2.5 Effacement (%): 70 Station: -2 Exam by:: Laural RoesB. Parks, RN @ [phone]  Blood pressure 127/76, pulse (!) 108, temperature 98.6 F (37 C), temperature source Oral, resp. rate 16, height 5\' 6"  (1.676 m), weight 87.1 kg (192 lb), unknown if currently breastfeeding.  ROM: gross amount of yellow fluid  EFW: 8 lbs; pelvis proven to 8 lbs 2 oz  Cephalic by Thayer OhmLeopold maneuvers and VE  FHT: BL 135 w/ moderate variability, +accels, occ variable, earlys, no lates TOCO: Irregular, occasionally q 8 min  Physical Exam  Nursing noteand vitalsreviewed. Neurological: She has normal reflexes. Constitutional: She is oriented to person, place, and time. She appears well-developed and well-nourished.  HENT:  Head: Normocephalic and atraumatic.  Neck: Normal range of motion.  Cardiovascular: Normal rate, regular rhythm and normal heart sounds.  Respiratory: Effort normal and breath sounds normal.  GI: Soft. Bowel sounds are normal. Abdomen is gravid.  Skin: Warm and dry.  Musculoskeletal: Exhibits 1+ lower extremity edema. Psychiatric: She has a normal mood and affect. Her behavior is normal.   Prenatal labs: ABO, Rh: B+ Antibody: Neg Rubella: Immune (02/19/16) RPR: NR (02/19/16) HBsAg: Neg (02/19/16) HIV: Neg (02/19/16) GBS: Positive (08/09/16) Normal pap 02/19/16 Hbg 13.1 @ 28 wks  Results for orders placed or performed during the hospital encounter of 09/04/16 (from the past 24 hour(s))  CBC     Status: Abnormal   Collection Time: 09/04/16 11:55 PM  Result Value Ref Range   WBC 9.1 4.0 - 10.5 K/uL   RBC 3.72 (L) 3.87 - 5.11 MIL/uL   Hemoglobin 12.0 12.0 - 15.0 g/dL   HCT 16.135.1 (L) 09.636.0 - 04.546.0 %   MCV 94.4 78.0 - 100.0 fL   MCH 32.3 26.0 - 34.0 pg   MCHC 34.2 30.0 - 36.0 g/dL   RDW 40.913.3 81.111.5 - 91.415.5 %   Platelets 218 150 - 400 K/uL  Type and  screen [facility]     Status: None   Collection Time: 09/04/16 11:55 PM  Result Value Ref Range   ABO/RH(D) B POS    Antibody Screen NEG    Sample Expiration 09/07/2016     Assessment: IUP at 40.3 wks Latent phase labor SROM x 3 hrs; no s/s of infection FWB: Overall reassuring GBS positive H/O VAVD & 3rd degree in previous delivery  [facility] orders.  Expectant management for now.  PCN for GBS positive status. Pain med/epidural prn. Expect SVD.        Sherre ScarletWILLIAMS, Aastha Dayley 09/05/2016, 1:36 AM   ADDENDUM: Pt sitting up for epidural. She is amenable to Pitocin after 2nd dose of PCN infused, if ctxs have spaced out, and her cvx is not progressing. States "labor took too long last time without Pitocin." Orders in - will begin 1x2 initially.   Sherre ScarletKimberly Rachyl Wuebker, CNM 09/05/16, 1:44 AM

## 2016-09-05 ENCOUNTER — Encounter (HOSPITAL_COMMUNITY): Payer: Self-pay | Admitting: Anesthesiology

## 2016-09-05 ENCOUNTER — Inpatient Hospital Stay (HOSPITAL_COMMUNITY): Payer: BLUE CROSS/BLUE SHIELD | Admitting: Anesthesiology

## 2016-09-05 DIAGNOSIS — B951 Streptococcus, group B, as the cause of diseases classified elsewhere: Secondary | ICD-10-CM

## 2016-09-05 DIAGNOSIS — O09299 Supervision of pregnancy with other poor reproductive or obstetric history, unspecified trimester: Secondary | ICD-10-CM

## 2016-09-05 LAB — CBC
HEMATOCRIT: 35.1 % — AB (ref 36.0–46.0)
Hemoglobin: 12 g/dL (ref 12.0–15.0)
MCH: 32.3 pg (ref 26.0–34.0)
MCHC: 34.2 g/dL (ref 30.0–36.0)
MCV: 94.4 fL (ref 78.0–100.0)
PLATELETS: 218 10*3/uL (ref 150–400)
RBC: 3.72 MIL/uL — AB (ref 3.87–5.11)
RDW: 13.3 % (ref 11.5–15.5)
WBC: 9.1 10*3/uL (ref 4.0–10.5)

## 2016-09-05 LAB — TYPE AND SCREEN
ABO/RH(D): B POS
ANTIBODY SCREEN: NEGATIVE

## 2016-09-05 LAB — RPR: RPR: NONREACTIVE

## 2016-09-05 MED ORDER — ONDANSETRON HCL 4 MG PO TABS: 4.0000 mg | ORAL_TABLET | ORAL | Status: DC | PRN

## 2016-09-05 MED ORDER — SIMETHICONE 80 MG PO CHEW: 80.0000 mg | CHEWABLE_TABLET | ORAL | Status: DC | PRN

## 2016-09-05 MED ORDER — COCONUT OIL OIL: 1.0000 "application " | TOPICAL_OIL | Status: DC | PRN

## 2016-09-05 MED ORDER — PHENYLEPHRINE 40 MCG/ML (10ML) SYRINGE FOR IV PUSH (FOR BLOOD PRESSURE SUPPORT)
80.0000 ug | PREFILLED_SYRINGE | INTRAVENOUS | Status: DC | PRN
  Filled 2016-09-05: qty 5

## 2016-09-05 MED ORDER — DIBUCAINE 1 % RE OINT: 1.0000 "application " | TOPICAL_OINTMENT | RECTAL | Status: DC | PRN

## 2016-09-05 MED ORDER — ZOLPIDEM TARTRATE 5 MG PO TABS: 5.0000 mg | ORAL_TABLET | Freq: Every evening | ORAL | Status: DC | PRN

## 2016-09-05 MED ORDER — TERBUTALINE SULFATE 1 MG/ML IJ SOLN
0.2500 mg | Freq: Once | INTRAMUSCULAR | Status: DC | PRN
  Filled 2016-09-05: qty 1

## 2016-09-05 MED ORDER — ONDANSETRON HCL 4 MG/2ML IJ SOLN: 4.0000 mg | INTRAMUSCULAR | Status: DC | PRN

## 2016-09-05 MED ORDER — LACTATED RINGERS IV SOLN
500.0000 mL | Freq: Once | INTRAVENOUS | Status: AC
  Administered 2016-09-05: 500 mL via INTRAVENOUS

## 2016-09-05 MED ORDER — ACETAMINOPHEN 325 MG PO TABS: 650.0000 mg | ORAL_TABLET | ORAL | Status: DC | PRN

## 2016-09-05 MED ORDER — PHENYLEPHRINE 40 MCG/ML (10ML) SYRINGE FOR IV PUSH (FOR BLOOD PRESSURE SUPPORT)
80.0000 ug | PREFILLED_SYRINGE | INTRAVENOUS | Status: DC | PRN
  Filled 2016-09-05: qty 10
  Filled 2016-09-05: qty 5

## 2016-09-05 MED ORDER — LIDOCAINE HCL (PF) 1 % IJ SOLN
INTRAMUSCULAR | Status: DC | PRN
  Administered 2016-09-05 (×2): 4 mL via EPIDURAL

## 2016-09-05 MED ORDER — SENNOSIDES-DOCUSATE SODIUM 8.6-50 MG PO TABS
2.0000 | ORAL_TABLET | ORAL | Status: DC
  Administered 2016-09-05: 2 via ORAL
  Filled 2016-09-05: qty 2

## 2016-09-05 MED ORDER — FENTANYL 2.5 MCG/ML BUPIVACAINE 1/10 % EPIDURAL INFUSION (WH - ANES)
14.0000 mL/h | INTRAMUSCULAR | Status: DC | PRN
  Administered 2016-09-05: 14 mL/h via EPIDURAL
  Filled 2016-09-05: qty 100

## 2016-09-05 MED ORDER — WITCH HAZEL-GLYCERIN EX PADS: 1.0000 "application " | MEDICATED_PAD | CUTANEOUS | Status: DC | PRN

## 2016-09-05 MED ORDER — EPHEDRINE 5 MG/ML INJ
10.0000 mg | INTRAVENOUS | Status: DC | PRN
Start: 2016-09-05 — End: 2016-09-05
  Filled 2016-09-05: qty 4

## 2016-09-05 MED ORDER — IBUPROFEN 600 MG PO TABS
600.0000 mg | ORAL_TABLET | Freq: Four times a day (QID) | ORAL | Status: DC
  Administered 2016-09-05 – 2016-09-06 (×4): 600 mg via ORAL
  Filled 2016-09-05 (×5): qty 1

## 2016-09-05 MED ORDER — TETANUS-DIPHTH-ACELL PERTUSSIS 5-2.5-18.5 LF-MCG/0.5 IM SUSP: 0.5000 mL | Freq: Once | INTRAMUSCULAR | Status: DC

## 2016-09-05 MED ORDER — PRENATAL MULTIVITAMIN CH
1.0000 | ORAL_TABLET | Freq: Every day | ORAL | Status: DC
  Administered 2016-09-05 – 2016-09-06 (×2): 1 via ORAL
  Filled 2016-09-05 (×2): qty 1

## 2016-09-05 MED ORDER — OXYTOCIN 40 UNITS IN LACTATED RINGERS INFUSION - SIMPLE MED: 1.0000 m[IU]/min | INTRAVENOUS | Status: DC

## 2016-09-05 MED ORDER — DIPHENHYDRAMINE HCL 50 MG/ML IJ SOLN: 12.5000 mg | INTRAMUSCULAR | Status: DC | PRN

## 2016-09-05 MED ORDER — DIPHENHYDRAMINE HCL 25 MG PO CAPS: 25.0000 mg | ORAL_CAPSULE | Freq: Four times a day (QID) | ORAL | Status: DC | PRN

## 2016-09-05 MED ORDER — BENZOCAINE-MENTHOL 20-0.5 % EX AERO
1.0000 "application " | INHALATION_SPRAY | CUTANEOUS | Status: DC | PRN
  Administered 2016-09-05: 1 via TOPICAL
  Filled 2016-09-05: qty 56

## 2016-09-05 NOTE — Anesthesia Procedure Notes (Signed)
Epidural Patient location during procedure: OB Start time: 09/05/2016 1:38 AM  Staffing Anesthesiologist: Mal AmabileFOSTER, Sabriel Borromeo Performed: anesthesiologist   Preanesthetic Checklist Completed: patient identified, site marked, surgical consent, pre-op evaluation, timeout performed, IV checked, risks and benefits discussed and monitors and equipment checked  Epidural Patient position: sitting Prep: site prepped and draped and DuraPrep Patient monitoring: continuous pulse ox and blood pressure Approach: midline Location: L3-L4 Injection technique: LOR air  Needle:  Needle type: Tuohy  Needle gauge: 17 G Needle length: 9 cm and 9 Needle insertion depth: 5 cm cm Catheter type: closed end flexible Catheter size: 19 Gauge Catheter at skin depth: 10 cm Test dose: negative and Other  Assessment Events: blood not aspirated, injection not painful, no injection resistance, negative IV test and no paresthesia  Additional Notes Patient identified. Risks and benefits discussed including failed block, incomplete  Pain control, post dural puncture headache, nerve damage, paralysis, blood pressure Changes, nausea, vomiting, reactions to medications-both toxic and allergic and post Partum back pain. All questions were answered. Patient expressed understanding and wished to proceed. Sterile technique was used throughout procedure. Epidural site was Dressed with sterile barrier dressing. No paresthesias, signs of intravascular injection Or signs of intrathecal spread were encountered.  Patient was more comfortable after the epidural was dosed. Please see RN's note for documentation of vital signs and FHR which are stable.

## 2016-09-05 NOTE — Anesthesia Postprocedure Evaluation (Signed)
Anesthesia Post Note  Patient: Norma [name]  Procedure(s) Performed: * No procedures listed *  Patient location during evaluation: Mother Baby Anesthesia Type: Epidural Level of consciousness: awake and alert Pain management: pain level controlled Vital Signs Assessment: post-procedure vital signs reviewed and stable Respiratory status: spontaneous breathing, nonlabored ventilation and respiratory function stable Cardiovascular status: stable Postop Assessment: no headache, no backache, epidural receding, patient able to bend at knees and no signs of nausea or vomiting Anesthetic complications: no        Last Vitals:  Vitals:   09/05/16 1045 09/05/16 1500  BP: 120/70 115/63  Pulse: 100 93  Resp: 18 17  Temp: 36.9 C 36.8 C    Last Pain:  Vitals:   09/05/16 1500  TempSrc: Oral  PainSc:    Pain Goal: Patients Stated Pain Goal: 0 (09/04/16 2253)               Rica RecordsICKELTON,Ainara Eldridge

## 2016-09-05 NOTE — Progress Notes (Signed)
Norma [name] is a 25 yo G2P1001 @ 40.3 wks admitted for SROM.  Subjective: Comfortable w/ epidural.   Objective: BP 100/60   Pulse (!) 116   Temp 98.8 F (37.1 C) (Oral)   Resp 16   Ht 5\' 6"  (1.676 m)   Wt 87.1 kg (192 lb)   SpO2 99%   BMI 30.99 kg/m  No intake/output data recorded. Total I/O In: -  Out: 500 [Urine:500]  FHT: BL 135 w/ moderate variability, +accels, earlys, occ variable, no lates UC:   irregular, every 2-3 minutes SVE:   Dilation: 10 Effacement (%): 100 Station: +1 Exam by:: Laural RoesB. Parks, RN @ [phone]  Assessment:  2nd stage labor GBS positive; adequately treated  Plan: Continue pushing Anticipate SVD Dr. Su Hiltoberts updated  Mayford KnifeWILLIAMS, [facility] Hospital[facility]IMBERLY CNM 09/05/2016, 06:18 AM

## 2016-09-05 NOTE — Lactation Note (Signed)
This note was copied from a baby's chart. Lactation Consultation Note  Patient Name: Norma [name]: 09/05/2016 Reason for consult: Initial assessment  Baby 7 hours old. Mom had just latched the baby to right breast in football position. Baby latched deeply, suckling rhythmically with a few swallows noted. Enc mom to nurse STS if baby sleepy at the breast. Mom states that she is seeing colostrum with hand expression. Mom reports that she nursed her 2361-month-old for 1 year without any issues. Mom had questions about returning to work and nursing/pumping, and whether the baby needs to be burped--all questions answered. Mom given [facility] brochure, aware of OP/BFSG and LC phone line assistance after D/C.   Maternal Data Has patient been taught Hand Expression?: Yes Does the patient have breastfeeding experience prior to this delivery?: Yes  Feeding Feeding Type: Breast Fed Length of feed: 18 min  LATCH Score/Interventions Latch: Grasps breast easily, tongue down, lips flanged, rhythmical sucking.  Audible Swallowing: A few with stimulation  Type of Nipple: Everted at rest and after stimulation  Comfort (Breast/Nipple): Soft / non-tender     Hold (Positioning): No assistance needed to correctly position infant at breast.  LATCH Score: 9  Lactation Tools Discussed/Used     Consult Status Consult Status: Follow-up [name]: 09/06/16 Follow-up type: In-patient    Sherlyn HayJennifer D Patrecia Veiga 09/05/2016, 2:20 PM

## 2016-09-05 NOTE — Anesthesia Preprocedure Evaluation (Signed)
Anesthesia Evaluation  Patient identified by MRN, date of birth, ID band Patient awake    Reviewed: Allergy & Precautions, Patient's Chart, lab work & pertinent test results  Airway Mallampati: II  TM Distance: >3 FB Neck ROM: Full    Dental no notable dental hx. (+) Teeth Intact   Pulmonary asthma ,    Pulmonary exam normal breath sounds clear to auscultation       Cardiovascular negative cardio ROS Normal cardiovascular exam Rhythm:Regular Rate:Normal     Neuro/Psych PSYCHIATRIC DISORDERS negative neurological ROS     GI/Hepatic negative GI ROS, Neg liver ROS,   Endo/Other  Obesity  Renal/GU Renal diseaseHx/o renal caalculi  negative genitourinary   Musculoskeletal negative musculoskeletal ROS (+)   Abdominal (+) + obese,   Peds  Hematology  (+) anemia ,   Anesthesia Other Findings   Reproductive/Obstetrics (+) Pregnancy                             Lab Results  Component Value Date   WBC 9.1 09/04/2016   HGB 12.0 09/04/2016   HCT 35.1 (L) 09/04/2016   MCV 94.4 09/04/2016   PLT 218 09/04/2016    Anesthesia Physical Anesthesia Plan  ASA: II  Anesthesia Plan: Epidural   Post-op Pain Management:    Induction:   Airway Management Planned: Natural Airway  Additional Equipment:   Intra-op Plan:   Post-operative Plan:   Informed Consent: I have reviewed the patients History and Physical, chart, labs and discussed the procedure including the risks, benefits and alternatives for the proposed anesthesia with the patient or authorized representative who has indicated his/her understanding and acceptance.     Plan Discussed with: Anesthesiologist  Anesthesia Plan Comments:         Anesthesia Quick Evaluation

## 2016-09-06 LAB — CBC
HEMATOCRIT: 31.4 % — AB (ref 36.0–46.0)
Hemoglobin: 10.6 g/dL — ABNORMAL LOW (ref 12.0–15.0)
MCH: 32.4 pg (ref 26.0–34.0)
MCHC: 33.8 g/dL (ref 30.0–36.0)
MCV: 96 fL (ref 78.0–100.0)
PLATELETS: 184 10*3/uL (ref 150–400)
RBC: 3.27 MIL/uL — ABNORMAL LOW (ref 3.87–5.11)
RDW: 13.6 % (ref 11.5–15.5)
WBC: 9.3 10*3/uL (ref 4.0–10.5)

## 2016-09-06 MED ORDER — IBUPROFEN 600 MG PO TABS: 600.0000 mg | ORAL_TABLET | Freq: Four times a day (QID) | ORAL | 0 refills | Status: DC

## 2016-09-06 NOTE — Discharge Summary (Signed)
Obstetric Discharge Summary  Reason for Admission: onset of labor on 09/05/16 Prenatal Procedures: none Intrapartum Procedures: spontaneous vaginal delivery by N. Prothera CNM on 09/05/16 Postpartum Procedures: none Complications-Operative and Postpartum: 2nd degree perineal laceration  Hemoglobin  Date Value Ref Range Status  09/06/2016 10.6 (L) 12.0 - 15.0 g/dL Final   HCT  Date Value Ref Range Status  09/06/2016 31.4 (L) 36.0 - 46.0 % Final  P[facility] followed at [facility] since 14+6weeks.  Prenatal course complicated by:  1. H/O VAVD & 3rd degree in previous delivery  2) ADHD - no meds 3) H/O kidney stones in 2015 - passed spontaneously 4) H/O asthma - as a child and teen. No issues in recent years 5) GBS positive - NKDA  Discharge Diagnoses: Term Pregnancy-delivered  Physical exam:   General: normal Lochia: appropriate Uterine Fundus: @ U firm non-tender  Extremities: No evidence of DVT seen on physical exam. Edema none   Hospital course: Presents with SROM and delivers normally in approxmately 6 hours from admission with a female. Good support system available and desiring discharge to day. Undecidied on birth contol. Breastfeeding.  Date: 09/06/2016 Activity: unrestricted Diet: routine Medications: PNV and Ibuprofen Condition: stable  Breastfeeding:   Yes.   Contraception:  undecided  Instructions: refer to practice specific booklet Discharge to: home Follow-up Information    [facility] Follow up in 6 week(s).   Specialty:  Obstetrics and Gynecology Contact information: [address] [place] [phone]          Newborn Data:  Delivery Note At [phone]  a viable female was delivered via  (Presentation:LOA ; vtx ).  APGAR:9 ,9 ; weight pending  .   Placenta status: complete, . 3V Cord:  with the following complications:None .    Anesthesia:  Epideral Episiotomy:   None Lacerations:  2nd Suture Repair: 2.0 vicryl Est. Blood Loss (mL):  100cc  Mom to postpartum.  Baby to Couplet care / Skin to Skin.  Henderson Newcomerancy Jean Prothero 09/05/2016, 7:49 AM Baby female Name:   Rhea PinkLori A Talton Delpriore CNM 09/06/2016, 11:27 AM

## 2016-09-06 NOTE — Lactation Note (Signed)
This note was copied from a baby's chart. Lactation Consultation Note  Patient Name: Norma Miles CostainHaley Doan QIONG'EToday's Date: 09/06/2016 Reason for consult: Follow-up assessment Mom reports baby cluster feeding. Basic teaching reviewed with parents. Advised baby should be at breast 8-12 times or more in 24 hours. Mom reports hearing swallows with baby nursing and nipples are round when baby comes off the breast. Encouraged to continue to BF with feeding ques, Engorgement care reviewed if needed. Advised of OP services and support group. Mom to call if she would like further assist.   Maternal Data    Feeding Feeding Type: Breast Fed Length of feed: 5 min  LATCH Score/Interventions Latch: Grasps breast easily, tongue down, lips flanged, rhythmical sucking.  Audible Swallowing: A few with stimulation  Type of Nipple: Everted at rest and after stimulation  Comfort (Breast/Nipple): Soft / non-tender     Hold (Positioning): No assistance needed to correctly position infant at breast.  LATCH Score: 9  Lactation Tools Discussed/Used     Consult Status Consult Status: Complete Date: 09/06/16 Follow-up type: In-patient    Alfred LevinsGranger, [place] Bartel Ann 09/06/2016, 11:14 AM

## 2016-09-06 NOTE — Discharge Instructions (Signed)

## 2017-08-23 LAB — OB RESULTS CONSOLE HIV ANTIBODY (ROUTINE TESTING): HIV: NONREACTIVE

## 2017-08-23 LAB — OB RESULTS CONSOLE ABO/RH: RH TYPE: POSITIVE

## 2017-08-23 LAB — OB RESULTS CONSOLE ANTIBODY SCREEN: Antibody Screen: NEGATIVE

## 2017-08-23 LAB — OB RESULTS CONSOLE RUBELLA ANTIBODY, IGM: Rubella: IMMUNE

## 2017-08-23 LAB — OB RESULTS CONSOLE RPR: RPR: NONREACTIVE

## 2017-08-23 LAB — OB RESULTS CONSOLE HEPATITIS B SURFACE ANTIGEN: HEP B S AG: NEGATIVE

## 2017-09-14 LAB — OB RESULTS CONSOLE GC/CHLAMYDIA
Chlamydia: NEGATIVE
Gonorrhea: NEGATIVE

## 2017-11-07 ENCOUNTER — Inpatient Hospital Stay (HOSPITAL_COMMUNITY)
Admission: AD | Admit: 2017-11-07 | Discharge: 2017-11-07 | Disposition: A | Discharge status: Home / Self Care | Payer: BLUE CROSS/BLUE SHIELD | Source: Ambulatory Visit | Attending: Obstetrics and Gynecology | Admitting: Obstetrics and Gynecology

## 2017-11-07 ENCOUNTER — Encounter (HOSPITAL_COMMUNITY): Payer: Self-pay

## 2017-11-07 DIAGNOSIS — J101 Influenza due to other identified influenza virus with other respiratory manifestations: Secondary | ICD-10-CM

## 2017-11-07 DIAGNOSIS — O99512 Diseases of the respiratory system complicating pregnancy, second trimester: Secondary | ICD-10-CM | POA: Insufficient documentation

## 2017-11-07 DIAGNOSIS — J45909 Unspecified asthma, uncomplicated: Secondary | ICD-10-CM | POA: Insufficient documentation

## 2017-11-07 DIAGNOSIS — O26892 Other specified pregnancy related conditions, second trimester: Secondary | ICD-10-CM | POA: Insufficient documentation

## 2017-11-07 DIAGNOSIS — Z791 Long term (current) use of non-steroidal anti-inflammatories (NSAID): Secondary | ICD-10-CM | POA: Diagnosis not present

## 2017-11-07 DIAGNOSIS — R109 Unspecified abdominal pain: Secondary | ICD-10-CM | POA: Diagnosis not present

## 2017-11-07 DIAGNOSIS — O9989 Other specified diseases and conditions complicating pregnancy, childbirth and the puerperium: Secondary | ICD-10-CM | POA: Insufficient documentation

## 2017-11-07 DIAGNOSIS — O368120 Decreased fetal movements, second trimester, not applicable or unspecified: Secondary | ICD-10-CM | POA: Insufficient documentation

## 2017-11-07 DIAGNOSIS — Z87442 Personal history of urinary calculi: Secondary | ICD-10-CM | POA: Insufficient documentation

## 2017-11-07 DIAGNOSIS — R197 Diarrhea, unspecified: Secondary | ICD-10-CM | POA: Diagnosis not present

## 2017-11-07 DIAGNOSIS — G44201 Tension-type headache, unspecified, intractable: Secondary | ICD-10-CM

## 2017-11-07 DIAGNOSIS — Z79899 Other long term (current) drug therapy: Secondary | ICD-10-CM | POA: Diagnosis not present

## 2017-11-07 DIAGNOSIS — R51 Headache: Secondary | ICD-10-CM | POA: Diagnosis not present

## 2017-11-07 DIAGNOSIS — Z3A19 19 weeks gestation of pregnancy: Secondary | ICD-10-CM | POA: Diagnosis not present

## 2017-11-07 DIAGNOSIS — O219 Vomiting of pregnancy, unspecified: Secondary | ICD-10-CM | POA: Diagnosis not present

## 2017-11-07 DIAGNOSIS — Z9889 Other specified postprocedural states: Secondary | ICD-10-CM | POA: Insufficient documentation

## 2017-11-07 DIAGNOSIS — N946 Dysmenorrhea, unspecified: Secondary | ICD-10-CM | POA: Diagnosis not present

## 2017-11-07 LAB — URINALYSIS, ROUTINE W REFLEX MICROSCOPIC
Bilirubin Urine: NEGATIVE
Glucose, UA: NEGATIVE mg/dL
HGB URINE DIPSTICK: NEGATIVE
KETONES UR: NEGATIVE mg/dL
Nitrite: NEGATIVE
PROTEIN: NEGATIVE mg/dL
SPECIFIC GRAVITY, URINE: 1.015 (ref 1.005–1.030)
pH: 6 (ref 5.0–8.0)

## 2017-11-07 MED ORDER — LACTATED RINGERS IV SOLN
INTRAVENOUS | Status: DC
  Administered 2017-11-07: 20:00:00 via INTRAVENOUS

## 2017-11-07 MED ORDER — DIPHENHYDRAMINE HCL 50 MG/ML IJ SOLN
25.0000 mg | Freq: Once | INTRAMUSCULAR | Status: AC
  Administered 2017-11-07: 25 mg via INTRAVENOUS
  Filled 2017-11-07: qty 1

## 2017-11-07 MED ORDER — PROMETHAZINE HCL 12.5 MG PO TABS: 12.5000 mg | ORAL_TABLET | Freq: Four times a day (QID) | ORAL | 0 refills | Status: DC | PRN

## 2017-11-07 MED ORDER — METOCLOPRAMIDE HCL 5 MG/ML IJ SOLN
10.0000 mg | Freq: Once | INTRAMUSCULAR | Status: AC
  Administered 2017-11-07: 10 mg via INTRAVENOUS
  Filled 2017-11-07: qty 2

## 2017-11-07 MED ORDER — DEXAMETHASONE SODIUM PHOSPHATE 10 MG/ML IJ SOLN
10.0000 mg | Freq: Once | INTRAMUSCULAR | Status: AC
  Administered 2017-11-07: 10 mg via INTRAVENOUS
  Filled 2017-11-07: qty 1

## 2017-11-07 NOTE — Discharge Instructions (Signed)

## 2017-11-07 NOTE — MAU Provider Note (Addendum)
Chief Complaint:  Headache   First Provider Initiated Contact with Patient 11/07/17 1927     HPI  HPI: Norma [name] is a 27 y.o. G3P2002 at 2919w0dwho presents to maternity admissions reporting decreased fetal movement, headache and cramping. Whole family has had the flu. She developed symptoms Wednesday and the office started her on Tamiflu.   She never got tested.  Called office to report persistent headache and decreased movement and office told her to come in immediately due to decreased movement and cramping  She denies LOF, vaginal bleeding, vaginal itching/burning, urinary symptoms, h/a, dizziness.  Has had diarrhea, nausea and vomiting off an on.     Past Medical History: Past Medical History:  Diagnosis Date  . Anemia   . Asthma    exercise induced  . Dysmenorrhea   . Right ureteral stone     Past obstetric history: OB History  Gravida Para Term Preterm AB Living  3 2 2     2   SAB TAB Ectopic Multiple Live Births        0 2    # Outcome Date GA Lbr Len/2nd Weight Sex Delivery Anes PTL Lv  3 Current           2 Term 09/05/16 6345w3d 06:48 / 02:01 8 lb 9.9 oz (3.909 kg) F Vag-Spont EPI  LIV  1 Term 01/31/15 7163w4d 24:28 / 04:04 8 lb 2 oz (3.685 kg) M Vag-Vacuum EPI  LIV      Past Surgical History: Past Surgical History:  Procedure Laterality Date  . TONSILLECTOMY    . WISDOM TOOTH EXTRACTION      Family History: No family history on file.  Social History: Social History   Tobacco Use  . Smoking status: Never Smoker  . Smokeless tobacco: Never Used  Substance Use Topics  . Alcohol use: Yes    Comment: occasional when not pregnant  . Drug use: No    Allergies: No Known Allergies  Meds:  Medications Prior to Admission  Medication Sig Dispense Refill Last Dose  . ibuprofen (ADVIL,MOTRIN) 600 MG tablet Take 1 tablet (600 mg total) by mouth every 6 (six) hours. 30 tablet 0   . Prenatal Vit-Fe Fumarate-FA (PRENATAL MULTIVITAMIN) TABS tablet Take 1 tablet by  mouth daily at 12 noon.    Past Week at Unknown time    I have reviewed patient's Past Medical Hx, Surgical Hx, Family Hx, Social Hx, medications and allergies.   ROS:  Review of Systems Other systems negative  Physical Exam  No data found. Constitutional: Well-developed, well-nourished female in no acute distress, but ill appearing.  Cardiovascular: normal rate and rhythm Respiratory: normal effort, clear to auscultation bilaterally GI: Abd soft, non-tender, gravid appropriate for gestational age.   No rebound or guarding. MS: Extremities nontender, no edema, normal ROM Neurologic: Alert and oriented x 4.  GU: Neg CVAT.  PELVIC EXAM:  cervix long and closed, posterior, firm  FHT:  150 by bedside US. Fetus was moving. Anterior placenta.    Labs: Results for orders placed or performed during the hospital encounter of 11/07/17 (from the past 24 hour(s))  Urinalysis, Routine w reflex microscopic     Status: Abnormal   Collection Time: 11/07/17  7:25 PM  Result Value Ref Range   Color, Urine YELLOW YELLOW   APPearance CLEAR CLEAR   Specific Gravity, Urine 1.015 1.005 - 1.030   pH 6.0 5.0 - 8.0   Glucose, UA NEGATIVE NEGATIVE mg/dL   Hgb urine  dipstick NEGATIVE NEGATIVE   Bilirubin Urine NEGATIVE NEGATIVE   Ketones, ur NEGATIVE NEGATIVE mg/dL   Protein, ur NEGATIVE NEGATIVE mg/dL   Nitrite NEGATIVE NEGATIVE   Leukocytes, UA LARGE (A) NEGATIVE   RBC / HPF 0-5 0 - 5 RBC/hpf   WBC, UA 6-30 0 - 5 WBC/hpf   Bacteria, UA FEW (A) NONE SEEN   Squamous Epithelial / LPF 0-5 (A) NONE SEEN   Mucus PRESENT        Imaging:  Bedside US done by me.  See above for findings  MAU Course/MDM: I have ordered labs and reviewed results. UA is negative.  Flu test not indicated due to already being treated. Consult Dr Elon Spanner with presentation, exam findings and test results.  Treatments in MAU included IV fluids, Reglan, Benadryl and Decadron cocktail.  .    Assessment: Single IUP at  [redacted]w[redacted]d  Plan: Report given to oncoming provider  Wynelle Bourgeois CNM, MSN Certified Nurse-Midwife 11/07/2017 7:27 PM   Discharge home. Pt discharge in stable condition. Pt reports HA down to 2/10, reports feeling "much better"  Educated on fetal movement at this stage of pregnancy  Follow up as scheduled for prenatal appointments Return to MAU as needed for emergencies  Finish Tamiflu tx for influenza   Follow-up Information    [facility] Follow up.   Why:  Follow up as scheduled for prenatal appointments  Contact information: [address] [place] [address] [phone]           Sharyon Cable, CNM 11/07/17, 9:06 PM

## 2017-11-09 DIAGNOSIS — Z363 Encounter for antenatal screening for malformations: Secondary | ICD-10-CM | POA: Diagnosis not present

## 2017-11-09 DIAGNOSIS — Z3A19 19 weeks gestation of pregnancy: Secondary | ICD-10-CM | POA: Diagnosis not present

## 2017-12-07 DIAGNOSIS — Z362 Encounter for other antenatal screening follow-up: Secondary | ICD-10-CM | POA: Diagnosis not present

## 2017-12-28 DIAGNOSIS — N390 Urinary tract infection, site not specified: Secondary | ICD-10-CM | POA: Diagnosis not present

## 2018-01-03 DIAGNOSIS — Z23 Encounter for immunization: Secondary | ICD-10-CM | POA: Diagnosis not present

## 2018-01-03 DIAGNOSIS — Z3480 Encounter for supervision of other normal pregnancy, unspecified trimester: Secondary | ICD-10-CM | POA: Diagnosis not present

## 2018-01-16 DIAGNOSIS — Z3480 Encounter for supervision of other normal pregnancy, unspecified trimester: Secondary | ICD-10-CM | POA: Diagnosis not present

## 2018-01-30 DIAGNOSIS — D649 Anemia, unspecified: Secondary | ICD-10-CM | POA: Diagnosis not present

## 2018-02-13 DIAGNOSIS — D509 Iron deficiency anemia, unspecified: Secondary | ICD-10-CM | POA: Diagnosis not present

## 2018-03-01 DIAGNOSIS — Z3685 Encounter for antenatal screening for Streptococcus B: Secondary | ICD-10-CM | POA: Diagnosis not present

## 2018-03-01 DIAGNOSIS — Z3480 Encounter for supervision of other normal pregnancy, unspecified trimester: Secondary | ICD-10-CM | POA: Diagnosis not present

## 2018-03-23 DIAGNOSIS — Z3480 Encounter for supervision of other normal pregnancy, unspecified trimester: Secondary | ICD-10-CM | POA: Diagnosis not present

## 2018-03-23 DIAGNOSIS — N912 Amenorrhea, unspecified: Secondary | ICD-10-CM | POA: Diagnosis not present

## 2018-03-24 LAB — OB RESULTS CONSOLE GBS: GBS: NEGATIVE

## 2018-03-26 ENCOUNTER — Encounter (HOSPITAL_COMMUNITY): Payer: Self-pay

## 2018-03-26 ENCOUNTER — Other Ambulatory Visit: Payer: Self-pay

## 2018-03-26 ENCOUNTER — Inpatient Hospital Stay (EMERGENCY_DEPARTMENT_HOSPITAL)
Admission: AD | Admit: 2018-03-26 | Discharge: 2018-03-26 | Disposition: A | Discharge status: Home / Self Care | Payer: BLUE CROSS/BLUE SHIELD | Source: Ambulatory Visit | Attending: Obstetrics and Gynecology | Admitting: Obstetrics and Gynecology

## 2018-03-26 DIAGNOSIS — Z3A39 39 weeks gestation of pregnancy: Secondary | ICD-10-CM | POA: Diagnosis not present

## 2018-03-26 DIAGNOSIS — Z3A38 38 weeks gestation of pregnancy: Secondary | ICD-10-CM

## 2018-03-26 DIAGNOSIS — O26893 Other specified pregnancy related conditions, third trimester: Secondary | ICD-10-CM | POA: Diagnosis not present

## 2018-03-26 DIAGNOSIS — O479 False labor, unspecified: Secondary | ICD-10-CM | POA: Diagnosis not present

## 2018-03-26 DIAGNOSIS — Z3A00 Weeks of gestation of pregnancy not specified: Secondary | ICD-10-CM | POA: Diagnosis not present

## 2018-03-26 DIAGNOSIS — R000 Tachycardia, unspecified: Secondary | ICD-10-CM | POA: Diagnosis not present

## 2018-03-26 DIAGNOSIS — Z0371 Encounter for suspected problem with amniotic cavity and membrane ruled out: Secondary | ICD-10-CM

## 2018-03-26 DIAGNOSIS — R58 Hemorrhage, not elsewhere classified: Secondary | ICD-10-CM | POA: Diagnosis not present

## 2018-03-26 DIAGNOSIS — Z3483 Encounter for supervision of other normal pregnancy, third trimester: Secondary | ICD-10-CM | POA: Diagnosis not present

## 2018-03-26 DIAGNOSIS — N898 Other specified noninflammatory disorders of vagina: Secondary | ICD-10-CM | POA: Diagnosis not present

## 2018-03-26 DIAGNOSIS — O411290 Chorioamnionitis, unspecified trimester, not applicable or unspecified: Secondary | ICD-10-CM | POA: Diagnosis not present

## 2018-03-26 NOTE — MAU Note (Signed)
Pt. States she noticed a trickle of fluid around 0900 this am.  When she went to the restroom she noticed a "yellow green" mucous like dc.  She has since noticed a thinner yellow tinted fluid.  Pt. Denies bleeding. Reports good FM

## 2018-03-26 NOTE — MAU Provider Note (Signed)
Chief Complaint  Patient presents with  . Rupture of Membranes    possible ROM      First Provider Initiated Contact with Patient 03/26/18 1249      S: Norma SessionsHaley L Marczak  is a 27 y.o. y.o. year old [id number] female at 1431w6d weeks gestation who presents to MAU reporting leaking of clear watery mucus since ~0930 this morning and yellowish-green discharge since yesterday, 03/26/18.    Contractions: Q 30 min Vaginal bleeding: Denies Fetal movement: nml  O:  Patient Vitals for the past 24 hrs:  BP Temp Temp src Pulse Resp SpO2 Height Weight  03/26/18 1153 123/82 98 F (36.7 C) Oral (!) 121 18 100 % 5\' 7"  (1.702 m) 206 lb (93.4 kg)  MHR 100's   General: NAD Heart: Regular rate Lungs: Normal rate and effort Abd: Soft, NT, Gravid, S=D Pelvic: NEFG, Neg pooling, no blood. Small-mod amount of curdlike, yellowish discharge  Dilation: 3 Presentation: Vertex Exam by:: [name] CNM   EFM: 140, Moderate variability, 15 x 15 accelerations, no decelerations Toco: rare, mild  Neg Fern Amnisure not needed per consult with Dr. Elon SpannerLeger.   A: 1431w6d week IUP No evidence of SROM or labor Yellow tinge to discharge 2/2 yeast FHR reactive  P: Admit to L&D per consult w/ [name], [name], MD./Discharge home in stable condition. Labor precautions and fetal kick counts. Follow-up as scheduled for prenatal visit or sooner as needed if symptoms worsen. Return to maternity admissions as needed if symptoms worsen.  [name], [place], CNM 03/26/2018 1:16 PM  2

## 2018-03-26 NOTE — MAU Note (Signed)
Urine in lab 

## 2018-03-26 NOTE — MAU Note (Signed)
I have communicated with [place] Smith CNM and reviewed vital signs:  Vitals:   03/26/18 1153 03/26/18 1319  BP: 123/82 121/81  Pulse: (!) 121 97  Resp: 18 18  Temp: 98 F (36.7 C)   SpO2: 100%     Vaginal exam:  Dilation: 2 Presentation: Vertex Exam by:: Dorathy KinsmanVirginia Smith CNM ,   Also reviewed contraction pattern and that non-stress test is reactive.  It has been documented that patient is not contracting with no cervical change over 1 hour not indicating active labor.  Patient denies any other complaints.  Based on this report provider has given order for discharge.  A discharge order and diagnosis entered by a provider.   Labor discharge instructions reviewed with patient.

## 2018-03-26 NOTE — Discharge Instructions (Signed)
Braxton Hicks Contractions °Contractions of the uterus can occur throughout pregnancy, but they are not always a sign that you are in labor. You may have practice contractions called Braxton Hicks contractions. These false labor contractions are sometimes confused with true labor. °What are Braxton Hicks contractions? °Braxton Hicks contractions are tightening movements that occur in the muscles of the uterus before labor. Unlike true labor contractions, these contractions do not result in opening (dilation) and thinning of the cervix. Toward the end of pregnancy (32-34 weeks), Braxton Hicks contractions can happen more often and may become stronger. These contractions are sometimes difficult to tell apart from true labor because they can be very uncomfortable. You should not feel embarrassed if you go to the hospital with false labor. °Sometimes, the only way to tell if you are in true labor is for your health care provider to look for changes in the cervix. The health care provider will do a physical exam and may monitor your contractions. If you are not in true labor, the exam should show that your cervix is not dilating and your water has not broken. °If there are other health problems associated with your pregnancy, it is completely safe for you to be sent home with false labor. You may continue to have Braxton Hicks contractions until you go into true labor. °How to tell the difference between true labor and false labor °True labor °· Contractions last 30-70 seconds. °· Contractions become very regular. °· Discomfort is usually felt in the top of the uterus, and it spreads to the lower abdomen and low back. °· Contractions do not go away with walking. °· Contractions usually become more intense and increase in frequency. °· The cervix dilates and gets thinner. °False labor °· Contractions are usually shorter and not as strong as true labor contractions. °· Contractions are usually irregular. °· Contractions  are often felt in the front of the lower abdomen and in the groin. °· Contractions may go away when you walk around or change positions while lying down. °· Contractions get weaker and are shorter-lasting as time goes on. °· The cervix usually does not dilate or become thin. °Follow these instructions at home: °· Take over-the-counter and prescription medicines only as told by your health care provider. °· Keep up with your usual exercises and follow other instructions from your health care provider. °· Eat and drink lightly if you think you are going into labor. °· If Braxton Hicks contractions are making you uncomfortable: °? Change your position from lying down or resting to walking, or change from walking to resting. °? Sit and rest in a tub of warm water. °? Drink enough fluid to keep your urine pale yellow. Dehydration may cause these contractions. °? Do slow and deep breathing several times an hour. °· Keep all follow-up prenatal visits as told by your health care provider. This is important. °Contact a health care provider if: °· You have a fever. °· You have continuous pain in your abdomen. °Get help right away if: °· Your contractions become stronger, more regular, and closer together. °· You have fluid leaking or gushing from your vagina. °· You pass blood-tinged mucus (bloody show). °· You have bleeding from your vagina. °· You have low back pain that you never had before. °· You feel your baby’s head pushing down and causing pelvic pressure. °· Your baby is not moving inside you as much as it used to. °Summary °· Contractions that occur before labor are called Braxton   Hicks contractions, false labor, or practice contractions. °· Braxton Hicks contractions are usually shorter, weaker, farther apart, and less regular than true labor contractions. True labor contractions usually become progressively stronger and regular and they become more frequent. °· Manage discomfort from Braxton Hicks contractions by  changing position, resting in a warm bath, drinking plenty of water, or practicing deep breathing. °This information is not intended to replace advice given to you by your health care provider. Make sure you discuss any questions you have with your health care provider. °Document Released: 01/13/2017 Document Revised: 01/13/2017 Document Reviewed: 01/13/2017 °Elsevier Interactive Patient Education © 2018 [employer] ° °

## 2018-03-27 ENCOUNTER — Inpatient Hospital Stay (HOSPITAL_COMMUNITY): Payer: BLUE CROSS/BLUE SHIELD | Admitting: Anesthesiology

## 2018-03-27 ENCOUNTER — Encounter (HOSPITAL_COMMUNITY): Payer: Self-pay | Admitting: Anesthesiology

## 2018-03-27 ENCOUNTER — Encounter (HOSPITAL_COMMUNITY): Payer: Self-pay | Admitting: *Deleted

## 2018-03-27 ENCOUNTER — Other Ambulatory Visit: Payer: Self-pay

## 2018-03-27 ENCOUNTER — Inpatient Hospital Stay (HOSPITAL_COMMUNITY)
Admission: AD | Admit: 2018-03-27 | Discharge: 2018-03-28 | DRG: 807 | Disposition: A | Discharge status: Home / Self Care | Payer: BLUE CROSS/BLUE SHIELD | Source: Ambulatory Visit | Attending: Obstetrics and Gynecology | Admitting: Obstetrics and Gynecology

## 2018-03-27 DIAGNOSIS — Z3483 Encounter for supervision of other normal pregnancy, third trimester: Secondary | ICD-10-CM | POA: Diagnosis present

## 2018-03-27 DIAGNOSIS — Z3A39 39 weeks gestation of pregnancy: Secondary | ICD-10-CM

## 2018-03-27 DIAGNOSIS — O479 False labor, unspecified: Secondary | ICD-10-CM | POA: Diagnosis present

## 2018-03-27 LAB — TYPE AND SCREEN
ABO/RH(D): B POS
Antibody Screen: NEGATIVE

## 2018-03-27 LAB — CBC
HCT: 33.1 % — ABNORMAL LOW (ref 36.0–46.0)
HEMOGLOBIN: 10.9 g/dL — AB (ref 12.0–15.0)
MCH: 31.1 pg (ref 26.0–34.0)
MCHC: 32.9 g/dL (ref 30.0–36.0)
MCV: 94.6 fL (ref 78.0–100.0)
PLATELETS: 162 10*3/uL (ref 150–400)
RBC: 3.5 MIL/uL — AB (ref 3.87–5.11)
RDW: 17.2 % — ABNORMAL HIGH (ref 11.5–15.5)
WBC: 8.2 10*3/uL (ref 4.0–10.5)

## 2018-03-27 MED ORDER — ONDANSETRON HCL 4 MG/2ML IJ SOLN: 4.0000 mg | INTRAMUSCULAR | Status: DC | PRN

## 2018-03-27 MED ORDER — LACTATED RINGERS IV SOLN: 500.0000 mL | INTRAVENOUS | Status: DC | PRN

## 2018-03-27 MED ORDER — OXYCODONE-ACETAMINOPHEN 5-325 MG PO TABS: 2.0000 | ORAL_TABLET | ORAL | Status: DC | PRN

## 2018-03-27 MED ORDER — OXYCODONE HCL 5 MG PO TABS: 10.0000 mg | ORAL_TABLET | ORAL | Status: DC | PRN

## 2018-03-27 MED ORDER — DIBUCAINE 1 % RE OINT
1.0000 "application " | TOPICAL_OINTMENT | RECTAL | Status: DC | PRN
  Filled 2018-03-27: qty 28

## 2018-03-27 MED ORDER — ZOLPIDEM TARTRATE 5 MG PO TABS: 5.0000 mg | ORAL_TABLET | Freq: Every evening | ORAL | Status: DC | PRN

## 2018-03-27 MED ORDER — METHYLERGONOVINE MALEATE 0.2 MG/ML IJ SOLN
0.2000 mg | Freq: Once | INTRAMUSCULAR | Status: AC
  Administered 2018-03-27: 0.2 mg via INTRAMUSCULAR

## 2018-03-27 MED ORDER — PHENYLEPHRINE 40 MCG/ML (10ML) SYRINGE FOR IV PUSH (FOR BLOOD PRESSURE SUPPORT): 80.0000 ug | PREFILLED_SYRINGE | INTRAVENOUS | Status: DC | PRN

## 2018-03-27 MED ORDER — BENZOCAINE-MENTHOL 20-0.5 % EX AERO
1.0000 "application " | INHALATION_SPRAY | CUTANEOUS | Status: DC | PRN
  Filled 2018-03-27: qty 56

## 2018-03-27 MED ORDER — SOD CITRATE-CITRIC ACID 500-334 MG/5ML PO SOLN: 30.0000 mL | ORAL | Status: DC | PRN

## 2018-03-27 MED ORDER — ACETAMINOPHEN 325 MG PO TABS: 650.0000 mg | ORAL_TABLET | ORAL | Status: DC | PRN

## 2018-03-27 MED ORDER — LIDOCAINE HCL (PF) 1 % IJ SOLN
30.0000 mL | INTRAMUSCULAR | Status: DC | PRN
  Filled 2018-03-27: qty 30

## 2018-03-27 MED ORDER — LACTATED RINGERS IV SOLN: 500.0000 mL | Freq: Once | INTRAVENOUS | Status: DC

## 2018-03-27 MED ORDER — OXYCODONE HCL 5 MG PO TABS: 5.0000 mg | ORAL_TABLET | ORAL | Status: DC | PRN

## 2018-03-27 MED ORDER — OXYTOCIN BOLUS FROM INFUSION: 500.0000 mL | Freq: Once | INTRAVENOUS | Status: DC

## 2018-03-27 MED ORDER — COCONUT OIL OIL
1.0000 "application " | TOPICAL_OIL | Status: DC | PRN
  Filled 2018-03-27: qty 120

## 2018-03-27 MED ORDER — FLEET ENEMA 7-19 GM/118ML RE ENEM: 1.0000 | ENEMA | RECTAL | Status: DC | PRN

## 2018-03-27 MED ORDER — LIDOCAINE HCL (PF) 1 % IJ SOLN
INTRAMUSCULAR | Status: DC | PRN
  Administered 2018-03-27: 8 mL via EPIDURAL

## 2018-03-27 MED ORDER — FENTANYL 2.5 MCG/ML BUPIVACAINE 1/10 % EPIDURAL INFUSION (WH - ANES)
14.0000 mL/h | INTRAMUSCULAR | Status: DC | PRN
  Administered 2018-03-27: 14 mL/h via EPIDURAL
  Filled 2018-03-27: qty 100

## 2018-03-27 MED ORDER — DIPHENHYDRAMINE HCL 50 MG/ML IJ SOLN: 12.5000 mg | INTRAMUSCULAR | Status: DC | PRN

## 2018-03-27 MED ORDER — ACETAMINOPHEN 325 MG PO TABS
650.0000 mg | ORAL_TABLET | ORAL | Status: DC | PRN
  Administered 2018-03-27: 650 mg via ORAL
  Filled 2018-03-27: qty 2

## 2018-03-27 MED ORDER — IBUPROFEN 600 MG PO TABS
600.0000 mg | ORAL_TABLET | Freq: Four times a day (QID) | ORAL | Status: DC
  Administered 2018-03-27 – 2018-03-28 (×4): 600 mg via ORAL
  Filled 2018-03-27 (×4): qty 1

## 2018-03-27 MED ORDER — OXYCODONE-ACETAMINOPHEN 5-325 MG PO TABS: 1.0000 | ORAL_TABLET | ORAL | Status: DC | PRN

## 2018-03-27 MED ORDER — OXYTOCIN 40 UNITS IN LACTATED RINGERS INFUSION - SIMPLE MED
2.5000 [IU]/h | INTRAVENOUS | Status: DC
  Filled 2018-03-27: qty 1000

## 2018-03-27 MED ORDER — EPHEDRINE 5 MG/ML INJ: 10.0000 mg | INTRAVENOUS | Status: DC | PRN

## 2018-03-27 MED ORDER — BUTORPHANOL TARTRATE 1 MG/ML IJ SOLN: 1.0000 mg | INTRAMUSCULAR | Status: DC | PRN

## 2018-03-27 MED ORDER — SIMETHICONE 80 MG PO CHEW: 80.0000 mg | CHEWABLE_TABLET | ORAL | Status: DC | PRN

## 2018-03-27 MED ORDER — WITCH HAZEL-GLYCERIN EX PADS: 1.0000 "application " | MEDICATED_PAD | CUTANEOUS | Status: DC | PRN

## 2018-03-27 MED ORDER — PHENYLEPHRINE 40 MCG/ML (10ML) SYRINGE FOR IV PUSH (FOR BLOOD PRESSURE SUPPORT)
80.0000 ug | PREFILLED_SYRINGE | INTRAVENOUS | Status: DC | PRN
  Filled 2018-03-27: qty 10

## 2018-03-27 MED ORDER — DIPHENHYDRAMINE HCL 25 MG PO CAPS: 25.0000 mg | ORAL_CAPSULE | Freq: Four times a day (QID) | ORAL | Status: DC | PRN

## 2018-03-27 MED ORDER — TETANUS-DIPHTH-ACELL PERTUSSIS 5-2.5-18.5 LF-MCG/0.5 IM SUSP: 0.5000 mL | Freq: Once | INTRAMUSCULAR | Status: DC

## 2018-03-27 MED ORDER — PRENATAL MULTIVITAMIN CH
1.0000 | ORAL_TABLET | Freq: Every day | ORAL | Status: DC
  Administered 2018-03-28: 1 via ORAL
  Filled 2018-03-27: qty 1

## 2018-03-27 MED ORDER — LACTATED RINGERS IV SOLN
INTRAVENOUS | Status: DC
  Administered 2018-03-27: 09:00:00 via INTRAVENOUS

## 2018-03-27 MED ORDER — ONDANSETRON HCL 4 MG/2ML IJ SOLN: 4.0000 mg | Freq: Four times a day (QID) | INTRAMUSCULAR | Status: DC | PRN

## 2018-03-27 MED ORDER — SENNOSIDES-DOCUSATE SODIUM 8.6-50 MG PO TABS
2.0000 | ORAL_TABLET | ORAL | Status: DC
  Administered 2018-03-27: 2 via ORAL
  Filled 2018-03-27: qty 2

## 2018-03-27 MED ORDER — ONDANSETRON HCL 4 MG PO TABS: 4.0000 mg | ORAL_TABLET | ORAL | Status: DC | PRN

## 2018-03-27 NOTE — Anesthesia Procedure Notes (Signed)
Epidural Patient location during procedure: OB Start time: 03/27/2018 9:50 AM End time: 03/27/2018 9:58 AM  Staffing Anesthesiologist: Bethena Midgetddono, Gabino Hagin, MD  Preanesthetic Checklist Completed: patient identified, site marked, surgical consent, pre-op evaluation, timeout performed, IV checked, risks and benefits discussed and monitors and equipment checked  Epidural Patient position: sitting Prep: site prepped and draped and DuraPrep Patient monitoring: continuous pulse ox and blood pressure Approach: midline Location: L4-L5 Injection technique: LOR air  Needle:  Needle type: Tuohy  Needle gauge: 17 G Needle length: 9 cm and 9 Needle insertion depth: 6 cm Catheter type: closed end flexible Catheter size: 19 Gauge Catheter at skin depth: 11 cm Test dose: negative  Assessment Events: blood not aspirated, injection not painful, no injection resistance, negative IV test and no paresthesia

## 2018-03-27 NOTE — Lactation Note (Signed)
This note was copied from a baby's chart. Lactation Consultation Note  Patient Name: Norma [name] ZOXWRMiles Costain'UToday's Date: 03/27/2018 Reason for consult: Initial assessment;Term  Visited with P3 Mom of 4 hr old baby born at term.  Mom sitting on side of bed, getting ready to get help from RN to go to bathroom.   This baby has latched twice already, Mom denies any difficulty. Encouraged STS as much as possible.   Lactation brochure left in room.  To review services at next visit.   Consult Status Consult Status: Follow-up Date: 03/28/18 Follow-up type: In-patient    Judee ClaraSmith, Rithik Odea E 03/27/2018, 3:30 PM

## 2018-03-27 NOTE — MAU Note (Signed)
Arrival by EMS was 3+ yesterday.  srom around 0800, bloody fluid. Hx of previa, has resolved. ctxs got more  Intention after srom

## 2018-03-27 NOTE — Progress Notes (Signed)
Delivery Note At 10:33 AM a viable female was delivered via Vaginal, Spontaneous (Presentation: LOA;  ).  APGAR: , ; weight  .   Placenta status:intact , .  Cord: 3 vessels with the following complications: marginal insertion .  Cord pH: pending  Another gush of blood noted. Also prolonged decel about 6 minutes with recovery.  Then started pushing with rapid progress over about 4 UCs with SVD Placenta to pathology  Anesthesia:  epidural Episiotomy: None Lacerations:  Small second degree ML lac repaired Suture Repair: 3.0 vicryl rapide Est. Blood Loss (mL):    Mom to postpartum.  Baby to Couplet care / Skin to Skin.  Roselle LocusJames E Florabel Faulks II 03/27/2018, 10:49 AM

## 2018-03-27 NOTE — Anesthesia Postprocedure Evaluation (Signed)
Anesthesia Post Note  Patient: Norma SessionsHaley L Heater  Procedure(s) Performed: AN AD HOC LABOR EPIDURAL     Patient location during evaluation: PACU Anesthesia Type: General Level of consciousness: awake and alert Pain management: pain level controlled Vital Signs Assessment: post-procedure vital signs reviewed and stable Respiratory status: spontaneous breathing, nonlabored ventilation, respiratory function stable and patient connected to nasal cannula oxygen Cardiovascular status: blood pressure returned to baseline and stable Postop Assessment: no apparent nausea or vomiting Anesthetic complications: no    Last Vitals:  Vitals:   03/27/18 1242 03/27/18 1400  BP: 122/76 118/76  Pulse: (!) 114 (!) 114  Resp:    Temp: 36.8 C 37.2 C  SpO2:  97%    Last Pain:  Vitals:   03/27/18 1400  TempSrc: Oral  PainSc:    Pain Goal:                 Amarian Botero

## 2018-03-27 NOTE — H&P (Signed)
Norma [name] is a 27 y.o. female presenting for SROM of bloody fluid @ 7:50 am, now with contractions. No trauma. OB History    Gravida  3   Para  2   Term  2   Preterm      AB      Living  2     SAB      TAB      Ectopic      Multiple  0   Live Births  2          Past Medical History:  Diagnosis Date  . Anemia   . Asthma    exercise induced  . Dysmenorrhea   . Right ureteral stone    Past Surgical History:  Procedure Laterality Date  . addenoidectomy    . TONSILLECTOMY    . WISDOM TOOTH EXTRACTION     Family History: family history is not on file. Social History:  reports that she has never smoked. She has never used smokeless tobacco. She reports that she drinks alcohol. She reports that she does not use drugs.     Maternal Diabetes: No Genetic Screening: Normal Maternal Ultrasounds/Referrals: Normal Fetal Ultrasounds or other Referrals:  None Maternal Substance Abuse:  No Significant Maternal Medications:  None Significant Maternal Lab Results:  None Other Comments:  None  Review of Systems  Eyes: Negative for blurred vision.  Gastrointestinal: Negative for abdominal pain.  Neurological: Negative for headaches.   Maternal Medical History:  Reason for admission: Rupture of membranes and contractions.   Contractions: Onset was 1-2 hours ago.    Fetal activity: Perceived fetal activity is normal.      Dilation: 5 Effacement (%): 60 Station: 0 Exam by:: Kris HartmannNicole Jones, RN Blood pressure 124/69, pulse (!) 103, temperature 97.8 F (36.6 C), temperature source Oral, resp. rate 20, SpO2 100 %, unknown if currently breastfeeding. Maternal Exam:  Uterine Assessment: Contraction strength is firm.  Contraction frequency is regular.   Abdomen: Fetal presentation: vertex     Fetal Exam Fetal State Assessment: Category I - tracings are normal.     Physical Exam  Cardiovascular: Normal rate and regular rhythm.  Respiratory: Effort normal  and breath sounds normal.  GI: Soft. There is no tenderness.  Neurological: She has normal reflexes.    Cx 8/C/-1/vtx Currently light blood C/W show Uterus soft between UCs  Prenatal labs: ABO, Rh: B/Positive/-- (12/11 0000) Antibody: Negative (12/11 0000) Rubella: Immune (12/11 0000) RPR: Nonreactive (12/11 0000)  HBsAg: Negative (12/11 0000)  HIV: Non-reactive (12/11 0000)  GBS: Negative (07/12 0000)   Assessment/Plan: 27 yo in active labor Initially had BRB with SROM>now C/W bloody show Anticipate vaginal delivery   Norma [name] 03/27/2018, 9:46 AM

## 2018-03-27 NOTE — Anesthesia Postprocedure Evaluation (Signed)
Anesthesia Post Note  Patient: Norma SessionsHaley L Gafford  Procedure(s) Performed: AN AD HOC LABOR EPIDURAL     Patient location during evaluation: Mother Baby Anesthesia Type: Epidural Level of consciousness: awake and alert Pain management: pain level controlled Vital Signs Assessment: post-procedure vital signs reviewed and stable Respiratory status: spontaneous breathing, nonlabored ventilation and respiratory function stable Cardiovascular status: stable Postop Assessment: no headache, no backache and epidural receding Anesthetic complications: no    Last Vitals:  Vitals:   03/27/18 1242 03/27/18 1400  BP: 122/76 118/76  Pulse: (!) 114 (!) 114  Resp:    Temp: 36.8 C 37.2 C  SpO2:  97%    Last Pain:  Vitals:   03/27/18 1400  TempSrc: Oral  PainSc:    Pain Goal:                 Orhan Mayorga

## 2018-03-27 NOTE — Anesthesia Preprocedure Evaluation (Signed)

## 2018-03-27 NOTE — Anesthesia Postprocedure Evaluation (Signed)
Anesthesia Post Note  Patient: Norma [name]  Procedure(s) Performed: LEA     Patient location during evaluation: Mother Baby Anesthesia Type: Epidural Level of consciousness: awake and alert Pain management: pain level controlled Vital Signs Assessment: post-procedure vital signs reviewed and stable Respiratory status: spontaneous breathing, nonlabored ventilation and respiratory function stable Cardiovascular status: stable Postop Assessment: no headache, no backache and epidural receding Anesthetic complications: no    Last Vitals:  Vitals:   03/27/18 1400 03/27/18 1809  BP: 118/76 107/87  Pulse: (!) 114 (!) 138  Resp:    Temp: 37.2 C 37.1 C  SpO2: 97% 99%    Last Pain:  Vitals:   03/27/18 2105  TempSrc:   PainSc: 0-No pain   Pain Goal:                 [name]

## 2018-03-28 LAB — RPR: RPR Ser Ql: NONREACTIVE

## 2018-03-28 LAB — CBC
HCT: 23.9 % — ABNORMAL LOW (ref 36.0–46.0)
HEMOGLOBIN: 8.2 g/dL — AB (ref 12.0–15.0)
MCH: 31.9 pg (ref 26.0–34.0)
MCHC: 34.3 g/dL (ref 30.0–36.0)
MCV: 93 fL (ref 78.0–100.0)
PLATELETS: 159 10*3/uL (ref 150–400)
RBC: 2.57 MIL/uL — AB (ref 3.87–5.11)
RDW: 17.2 % — ABNORMAL HIGH (ref 11.5–15.5)
WBC: 9.9 10*3/uL (ref 4.0–10.5)

## 2018-03-28 NOTE — Progress Notes (Signed)
CSW received consult for hx of Postpartum Depression x2.  CSW met with MOB to offer support and complete assessment.    When CSW arrived, MOB was resting in bed bonding with infant as evidence by engaging in skin to skin; MOB appeared comfortable.  FOB was also present and was observing MOB's and infant's interaction from the couch.  CSW explained CSW's role and MOB gave CSW permission to complete the assessment while FOB was present. FOB did not engage with CSW.  MOB was polite, easy to engage, and receptive to meeting with CSW.    MOB reported having a good support team that consist of immediate family members and friends.  MOB also shared that MOB is currently in graduate school studying counseling and recognizes a need to seek help if help is warranted.  Outpatient counseling resources were provided by CSW.   CSW inquired about MOB's hx with PPD and MOB acknowledged experienced PPD with MOB's older 2 children. MOB shared that MOB experienced daily crying, feelings of sadness, and feeling overwhelmed.  Per MOB, MOB symptoms last for approximately  2 months and subsided without medication interventions.   CSW provided education regarding the baby blues period vs. perinatal mood disorders, discussed treatment and gave resources for mental health follow up if concerns arise.  CSW recommends self-evaluation during the postpartum time period using the New Mom Checklist from Postpartum Progress and encouraged MOB to contact a medical professional if symptoms are noted at any time.      CSW identifies no further need for intervention and no barriers to discharge at this time.  Laurey Arrow, MSW, LCSW Clinical Social Work [phone]

## 2018-03-28 NOTE — Discharge Summary (Signed)
Obstetric Discharge Summary Reason for Admission: onset of labor Prenatal Procedures: none Intrapartum Procedures: spontaneous vaginal delivery Postpartum Procedures: none Complications-Operative and Postpartum: none and 2 degree perineal laceration Hemoglobin  Date Value Ref Range Status  03/28/2018 8.2 (L) 12.0 - 15.0 g/dL Final    Comment:    DELTA CHECK NOTED REPEATED TO VERIFY    HCT  Date Value Ref Range Status  03/28/2018 23.9 (L) 36.0 - 46.0 % Final    Physical Exam:  General: alert, cooperative, appears stated age and no distress Lochia: appropriate Uterine Fundus: firm Incision: healing well DVT Evaluation: No evidence of DVT seen on physical exam.  Discharge Diagnoses: Term Pregnancy-delivered  Discharge Information: Date: 03/28/2018 Activity: pelvic rest Diet: routine Medications: Ibuprofen, iron and PNV Condition: stable Instructions: refer to practice specific booklet Discharge to: home   Newborn Data: Live born female  Birth Weight: 9 lb 7.5 oz (4295 g) APGAR: 8, 9  Newborn Delivery   Birth date/time:  03/27/2018 10:33:00 Delivery type:  Vaginal, Spontaneous     Home with mother.  Norma [name] C 03/28/2018, 9:07 AM

## 2018-03-29 ENCOUNTER — Inpatient Hospital Stay (HOSPITAL_COMMUNITY): Payer: BLUE CROSS/BLUE SHIELD

## 2018-03-29 ENCOUNTER — Encounter (HOSPITAL_COMMUNITY): Payer: Self-pay | Admitting: Student

## 2018-03-29 ENCOUNTER — Other Ambulatory Visit: Payer: Self-pay

## 2018-03-29 ENCOUNTER — Observation Stay (HOSPITAL_COMMUNITY)
Admission: AD | Admit: 2018-03-29 | Discharge: 2018-03-30 | Disposition: A | Discharge status: Home / Self Care | Payer: BLUE CROSS/BLUE SHIELD | Source: Ambulatory Visit | Attending: Obstetrics & Gynecology | Admitting: Obstetrics & Gynecology

## 2018-03-29 LAB — TYPE AND SCREEN
ABO/RH(D): B POS
Antibody Screen: NEGATIVE

## 2018-03-29 LAB — CBC
HCT: 24.9 % — ABNORMAL LOW (ref 36.0–46.0)
Hemoglobin: 8.3 g/dL — ABNORMAL LOW (ref 12.0–15.0)
MCH: 31.9 pg (ref 26.0–34.0)
MCHC: 33.3 g/dL (ref 30.0–36.0)
MCV: 95.8 fL (ref 78.0–100.0)
PLATELETS: 169 10*3/uL (ref 150–400)
RBC: 2.6 MIL/uL — ABNORMAL LOW (ref 3.87–5.11)
RDW: 17.5 % — AB (ref 11.5–15.5)
WBC: 9.1 10*3/uL (ref 4.0–10.5)

## 2018-03-29 MED ORDER — ONDANSETRON HCL 4 MG/2ML IJ SOLN
INTRAMUSCULAR | Status: AC
  Filled 2018-03-29: qty 2

## 2018-03-29 MED ORDER — LIDOCAINE HCL (CARDIAC) PF 100 MG/5ML IV SOSY
PREFILLED_SYRINGE | INTRAVENOUS | Status: AC
  Filled 2018-03-29: qty 5

## 2018-03-29 MED ORDER — SCOPOLAMINE 1 MG/3DAYS TD PT72
MEDICATED_PATCH | TRANSDERMAL | Status: AC
  Filled 2018-03-29: qty 1

## 2018-03-29 MED ORDER — ACETAMINOPHEN 500 MG PO TABS
1000.0000 mg | ORAL_TABLET | Freq: Once | ORAL | Status: AC
  Administered 2018-03-30: 1000 mg via ORAL
  Filled 2018-03-29: qty 2

## 2018-03-29 MED ORDER — SODIUM CHLORIDE 0.9 % IV SOLN
2.0000 g | INTRAVENOUS | Status: DC
  Filled 2018-03-29: qty 2

## 2018-03-29 MED ORDER — SCOPOLAMINE 1 MG/3DAYS TD PT72
1.0000 | MEDICATED_PATCH | TRANSDERMAL | Status: DC
  Administered 2018-03-30: 1.5 mg via TRANSDERMAL
  Filled 2018-03-29: qty 1

## 2018-03-29 MED ORDER — PROPOFOL 10 MG/ML IV BOLUS
INTRAVENOUS | Status: AC
  Filled 2018-03-29: qty 20

## 2018-03-29 MED ORDER — DEXAMETHASONE SODIUM PHOSPHATE 10 MG/ML IJ SOLN
INTRAMUSCULAR | Status: AC
Start: 2018-03-29 — End: ?
  Filled 2018-03-29: qty 1

## 2018-03-29 MED ORDER — LACTATED RINGERS IV SOLN
INTRAVENOUS | Status: DC
  Administered 2018-03-30: via INTRAVENOUS

## 2018-03-29 MED ORDER — FENTANYL CITRATE (PF) 100 MCG/2ML IJ SOLN
INTRAMUSCULAR | Status: AC
  Filled 2018-03-29: qty 2

## 2018-03-29 MED ORDER — MIDAZOLAM HCL 2 MG/2ML IJ SOLN
INTRAMUSCULAR | Status: AC
  Filled 2018-03-29: qty 2

## 2018-03-29 MED ORDER — FAMOTIDINE IN NACL 20-0.9 MG/50ML-% IV SOLN
20.0000 mg | Freq: Once | INTRAVENOUS | Status: AC
  Administered 2018-03-30: 20 mg via INTRAVENOUS
  Filled 2018-03-29: qty 50

## 2018-03-29 NOTE — MAU Note (Signed)
Pt states that around 1945 she felt like she  Had a blood clot in her vagina. She went to the bathroom thinking that the blood clot would drop, but it did not. She then called the on-call provider for her practice and was told to come in and be evaluated.

## 2018-03-29 NOTE — H&P (Signed)
Norma SessionsHaley L [name] is an 27 y.o. female. SVD 2 days ago, D/C yest w/ Hgb 8.2, noted this PM increased bleeding and passing blood clot or tissue>>>exam in MAU, placentaal tissue @ introitus>>removed, but US shows poss retained POC higher up, OBSV for D+E       Past Medical History:  Diagnosis Date  . Anemia   . Asthma    exercise induced  . Dysmenorrhea   . Right ureteral stone     Past Surgical History:  Procedure Laterality Date  . ADENOIDECTOMY    . TONSILLECTOMY    . WISDOM TOOTH EXTRACTION      History reviewed. No pertinent family history.  Social History:  reports that she has never smoked. She has never used smokeless tobacco. She reports that she drinks alcohol. She reports that she does not use drugs.  Allergies: No Known Allergies  Medications Prior to Admission  Medication Sig Dispense Refill Last Dose  . acetaminophen (TYLENOL) 500 MG tablet Take 1,000 mg by mouth every 6 (six) hours as needed for mild pain.   unk at prn  . Prenatal Vit-Fe Fumarate-FA (PRENATAL MULTIVITAMIN) TABS tablet Take 1 tablet by mouth daily at 12 noon.    03/27/2018 at Unknown time  . promethazine (PHENERGAN) 12.5 MG tablet Take 1 tablet (12.5 mg total) by mouth every 6 (six) hours as needed for nausea or vomiting. (Patient not taking: Reported on 03/27/2018) 30 tablet 0 Not Taking at Unknown time    ROS  Blood pressure 121/76, pulse (!) 117, temperature 99.4 F (37.4 C), temperature source Oral, resp. rate 16, height 5\' 7"  (1.702 m), weight 190 lb 1.9 oz (86.2 kg), SpO2 100 %, currently breastfeeding. Physical Exam  Constitutional: She is oriented to person, place, and time. She appears well-developed and well-nourished.  HENT:  Head: Atraumatic.  Neck: Normal range of motion.  Cardiovascular: Normal rate and regular rhythm.  Respiratory: Effort normal and breath sounds normal.  GI: Soft.  Fundus firm  Genitourinary:  Genitourinary Comments: Placenta @ introitus  Musculoskeletal: Normal  range of motion.  Neurological: She is oriented to person, place, and time.    Results for orders placed or performed during the hospital encounter of 03/29/18 (from the past 24 hour(s))  CBC     Status: Abnormal   Collection Time: 03/29/18 10:05 PM  Result Value Ref Range   WBC 9.1 4.0 - 10.5 K/uL   RBC 2.60 (L) 3.87 - 5.11 MIL/uL   Hemoglobin 8.3 (L) 12.0 - 15.0 g/dL   HCT 57.824.9 (L) 46.936.0 - 62.946.0 %   MCV 95.8 78.0 - 100.0 fL   MCH 31.9 26.0 - 34.0 pg   MCHC 33.3 30.0 - 36.0 g/dL   RDW 52.817.5 (H) 41.311.5 - 24.415.5 %   Platelets 169 150 - 400 K/uL  Type and screen     Status: None   Collection Time: 03/29/18 10:05 PM  Result Value Ref Range   ABO/RH(D) B POS    Antibody Screen NEG    Sample Expiration      04/01/2018 Performed at [facility], [address] [place], [place] [id number]     [place] Pelvis (transabdominal Only)  Result Date: 03/29/2018 CLINICAL DATA:  Initial evaluation for acute postpartum bleeding EXAM: TRANSABDOMINAL ULTRASOUND OF PELVIS TECHNIQUE: Transabdominal ultrasound examination of the pelvis was performed including evaluation of the uterus, ovaries, adnexal regions, and pelvic cul-de-sac. COMPARISON:  None. FINDINGS: Uterus Measurements: 15.7 x 8.5 x 11.4 cm. No fibroids or other mass visualized.  Endometrium Thickness: 29.4 mm. Prominent heterogeneous echogenic material within the endometrial cavity. Few scant areas of internal vascularity, raising the possibility for possible retained products. Right ovary Measurements: 4.0 x 3.5 x 3.4 cm. Normal appearance/no adnexal mass. Left ovary Measurements: 4.5 x 2.4 x 2.5 cm. Normal appearance/no adnexal mass. Other findings:  No abnormal free fluid. IMPRESSION: 1. Echogenic material within the endometrial cavity measuring up to 29 mm in thickness with scant areas of internal vascularity. Finding raises the possibility for retained products of conception. 2. Otherwise normal pelvic ultrasound. Electronically Signed   By: Rise Mu M.D.   On: 03/29/2018 22:27    Assessment/Plan: Retained POC>>rec D+E, proced + risks discussed  Norma [name] 03/29/2018, 11:31 PM

## 2018-03-29 NOTE — Anesthesia Preprocedure Evaluation (Addendum)
Anesthesia Evaluation  Patient identified by MRN, date of birth, ID band Patient awake    Reviewed: Allergy & Precautions, NPO status , Patient's Chart, lab work & pertinent test results  Airway Mallampati: III  TM Distance: >3 FB Neck ROM: Full    Dental no notable dental hx. (+) Teeth Intact, Dental Advisory Given   Pulmonary neg pulmonary ROS, asthma ,    Pulmonary exam normal breath sounds clear to auscultation       Cardiovascular negative cardio ROS Normal cardiovascular exam Rhythm:Regular Rate:Normal     Neuro/Psych negative neurological ROS  negative psych ROS   GI/Hepatic negative GI ROS,   Endo/Other    Renal/GU      Musculoskeletal   Abdominal   Peds  Hematology  (+) anemia ,   Anesthesia Other Findings   Reproductive/Obstetrics (+) Breast feeding                             Lab Results  Component Value Date   WBC 9.1 03/29/2018   HGB 8.3 (L) 03/29/2018   HCT 24.9 (L) 03/29/2018   MCV 95.8 03/29/2018   PLT 169 03/29/2018    Anesthesia Physical Anesthesia Plan  ASA: II and emergent  Anesthesia Plan: General   Post-op Pain Management:    Induction: Cricoid pressure planned, Rapid sequence and Intravenous  PONV Risk Score and Plan: 4 or greater and Treatment may vary due to age or medical condition, Dexamethasone, Ondansetron and Scopolamine patch - Pre-op  Airway Management Planned: Oral ETT  Additional Equipment:   Intra-op Plan:   Post-operative Plan: Extubation in OR  Informed Consent: I have reviewed the patients History and Physical, chart, labs and discussed the procedure including the risks, benefits and alternatives for the proposed anesthesia with the patient or authorized representative who has indicated his/her understanding and acceptance.   Dental advisory given  Plan Discussed with: Anesthesiologist and CRNA  Anesthesia Plan Comments:          Anesthesia Quick Evaluation

## 2018-03-29 NOTE — MAU Provider Note (Signed)
History     CSN: [id number]  Arrival date and time: 03/29/18 2131   First Provider Initiated Contact with Patient 03/29/18 2151      Chief Complaint  Patient presents with  . Vaginal Bleeding   HPI Norma [name] is a 27 y.o. [phone] [id number] who presents 2 s/p SVD for vaginal bleeding. States that she had a placental abruption at time of delivery. States it took some time to get her placenta out and she had to "push" a lot for it.  States she has been bleeding some since delivery but heavier this evening. Around 745 pm she felt like there was a blood clot hanging out of her vagina that would not come out. Has had low back pain and abdominal pain that feels like cramps. Pain is intermittent. Rates pain 6/10. Denies fever/chills.   OB History    Gravida  3   Para  3   Term  3   Preterm      AB      Living  3     SAB      TAB      Ectopic      Multiple  0   Live Births  3           Past Medical History:  Diagnosis Date  . Anemia   . Asthma    exercise induced  . Dysmenorrhea   . Right ureteral stone     Past Surgical History:  Procedure Laterality Date  . ADENOIDECTOMY    . TONSILLECTOMY    . WISDOM TOOTH EXTRACTION      No family history on file.  Social History   Tobacco Use  . Smoking status: Never Smoker  . Smokeless tobacco: Never Used  Substance Use Topics  . Alcohol use: Yes    Comment: occasional when not pregnant  . Drug use: No    Allergies: No Known Allergies  Medications Prior to Admission  Medication Sig Dispense Refill Last Dose  . acetaminophen (TYLENOL) 500 MG tablet Take 1,000 mg by mouth every 6 (six) hours as needed for mild pain.   unk at prn  . Prenatal Vit-Fe Fumarate-FA (PRENATAL MULTIVITAMIN) TABS tablet Take 1 tablet by mouth daily at 12 noon.    03/27/2018 at Unknown time  . promethazine (PHENERGAN) 12.5 MG tablet Take 1 tablet (12.5 mg total) by mouth every 6 (six) hours as needed for nausea or vomiting. (Patient not  taking: Reported on 03/27/2018) 30 tablet 0 Not Taking at Unknown time    Review of Systems  Constitutional: Negative for chills and fever.  Gastrointestinal: Positive for abdominal pain. Negative for nausea and vomiting.  Genitourinary: Positive for vaginal bleeding.  Musculoskeletal: Positive for back pain.  Neurological: Positive for dizziness. Negative for headaches.   Physical Exam   Patient Vitals for the past 24 hrs:  BP Temp Temp src Pulse Resp SpO2 Height Weight  03/29/18 2242 121/76 99.4 F (37.4 C) Oral (!) 117 16 100 % - -  03/29/18 2144 129/70 99.3 F (37.4 C) Oral (!) 108 20 100 % - -  03/29/18 2140 - - - - - - 5\' 7"  (1.702 m) 190 lb 1.9 oz (86.2 kg)     Physical Exam  Nursing note and vitals reviewed. Constitutional: She is oriented to person, place, and time. She appears well-developed and well-nourished. No distress.  HENT:  Head: Normocephalic and atraumatic.  Eyes: Conjunctivae are normal. Right eye exhibits no discharge. Left eye exhibits  no discharge. No scleral icterus.  Neck: Normal range of motion.  Respiratory: Effort normal. No respiratory distress.  GI: Soft. There is tenderness in the suprapubic area. There is no rigidity and no guarding.  Genitourinary:  Genitourinary Comments: Palm-sized portion of placenta protruding from introitus. Sterile digital exam performed, cervix dilated 3 cm with placenta leading into uterus. Placenta did not come out with gentle pulling.   Neurological: She is alert and oriented to person, place, and time.  Skin: Skin is warm and dry. She is not diaphoretic.  Psychiatric: She has a normal mood and affect. Her behavior is normal. Judgment and thought content normal.    MAU Course  Procedures Results for orders placed or performed during the hospital encounter of 03/29/18 (from the past 24 hour(s))  CBC     Status: Abnormal   Collection Time: 03/29/18 10:05 PM  Result Value Ref Range   WBC 9.1 4.0 - 10.5 K/uL   RBC  2.60 (L) 3.87 - 5.11 MIL/uL   Hemoglobin 8.3 (L) 12.0 - 15.0 g/dL   HCT 01.0 (L) 27.2 - 53.6 %   MCV 95.8 78.0 - 100.0 fL   MCH 31.9 26.0 - 34.0 pg   MCHC 33.3 30.0 - 36.0 g/dL   RDW 64.4 (H) 03.4 - 74.2 %   Platelets 169 150 - 400 K/uL  Type and screen     Status: None   Collection Time: 03/29/18 10:05 PM  Result Value Ref Range   ABO/RH(D) B POS    Antibody Screen NEG    Sample Expiration      04/01/2018 Performed at [facility], [address] [place], [place] [address]    US Pelvis (transabdominal Only)  Result Date: 03/29/2018 CLINICAL DATA:  Initial evaluation for acute postpartum bleeding EXAM: TRANSABDOMINAL ULTRASOUND OF PELVIS TECHNIQUE: Transabdominal ultrasound examination of the pelvis was performed including evaluation of the uterus, ovaries, adnexal regions, and pelvic cul-de-sac. COMPARISON:  None. FINDINGS: Uterus Measurements: 15.7 x 8.5 x 11.4 cm. No fibroids or other mass visualized. Endometrium Thickness: 29.4 mm. Prominent heterogeneous echogenic material within the endometrial cavity. Few scant areas of internal vascularity, raising the possibility for possible retained products. Right ovary Measurements: 4.0 x 3.5 x 3.4 cm. Normal appearance/no adnexal mass. Left ovary Measurements: 4.5 x 2.4 x 2.5 cm. Normal appearance/no adnexal mass. Other findings:  No abnormal free fluid. IMPRESSION: 1. Echogenic material within the endometrial cavity measuring up to 29 mm in thickness with scant areas of internal vascularity. Finding raises the possibility for retained products of conception. 2. Otherwise normal pelvic ultrasound. Electronically Signed   By: Rise Mu M.D.   On: 03/29/2018 22:27    MDM Dr. Marcelle Overlie notified of exam. CBC & ultrasound ordered. Dr. Marcelle Overlie en route to evaluate patient.   Assessment and Plan  A; 1. Retained portions of placenta   2. Postpartum bleeding    P: Care turned over to Dr. Samson Frederic 03/29/2018, 9:51 PM

## 2018-03-30 ENCOUNTER — Encounter (HOSPITAL_COMMUNITY)
Admission: AD | Disposition: A | Discharge status: Home / Self Care | Payer: Self-pay | Source: Ambulatory Visit | Attending: Obstetrics and Gynecology

## 2018-03-30 ENCOUNTER — Inpatient Hospital Stay (HOSPITAL_COMMUNITY): Payer: BLUE CROSS/BLUE SHIELD | Admitting: Anesthesiology

## 2018-03-30 ENCOUNTER — Other Ambulatory Visit: Payer: Self-pay

## 2018-03-30 DIAGNOSIS — O034 Incomplete spontaneous abortion without complication: Secondary | ICD-10-CM | POA: Diagnosis not present

## 2018-03-30 DIAGNOSIS — J45909 Unspecified asthma, uncomplicated: Secondary | ICD-10-CM | POA: Diagnosis not present

## 2018-03-30 DIAGNOSIS — D649 Anemia, unspecified: Secondary | ICD-10-CM | POA: Diagnosis not present

## 2018-03-30 HISTORY — PX: DILATION AND CURETTAGE OF UTERUS: SHX78

## 2018-03-30 LAB — CBC
HCT: 23.4 % — ABNORMAL LOW (ref 36.0–46.0)
Hemoglobin: 7.8 g/dL — ABNORMAL LOW (ref 12.0–15.0)
MCH: 31.7 pg (ref 26.0–34.0)
MCHC: 33.3 g/dL (ref 30.0–36.0)
MCV: 95.1 fL (ref 78.0–100.0)
PLATELETS: 152 10*3/uL (ref 150–400)
RBC: 2.46 MIL/uL — ABNORMAL LOW (ref 3.87–5.11)
RDW: 17.5 % — AB (ref 11.5–15.5)
WBC: 9 10*3/uL (ref 4.0–10.5)

## 2018-03-30 SURGERY — DILATION AND CURETTAGE
Anesthesia: General

## 2018-03-30 MED ORDER — KETOROLAC TROMETHAMINE 30 MG/ML IJ SOLN
30.0000 mg | Freq: Four times a day (QID) | INTRAMUSCULAR | Status: DC
  Administered 2018-03-30: 30 mg via INTRAVENOUS
  Filled 2018-03-30: qty 1

## 2018-03-30 MED ORDER — FENTANYL CITRATE (PF) 100 MCG/2ML IJ SOLN
INTRAMUSCULAR | Status: AC
  Filled 2018-03-30: qty 2

## 2018-03-30 MED ORDER — IBUPROFEN 800 MG PO TABS: 800.0000 mg | ORAL_TABLET | Freq: Three times a day (TID) | ORAL | Status: DC | PRN

## 2018-03-30 MED ORDER — OXYCODONE-ACETAMINOPHEN 5-325 MG PO TABS: 1.0000 | ORAL_TABLET | Freq: Four times a day (QID) | ORAL | Status: DC | PRN

## 2018-03-30 MED ORDER — MENTHOL 3 MG MT LOZG: 1.0000 | LOZENGE | OROMUCOSAL | Status: DC | PRN

## 2018-03-30 MED ORDER — FENTANYL CITRATE (PF) 100 MCG/2ML IJ SOLN
INTRAMUSCULAR | Status: DC | PRN
  Administered 2018-03-30 (×2): 100 ug via INTRAVENOUS
  Administered 2018-03-30: 50 ug via INTRAVENOUS

## 2018-03-30 MED ORDER — KETOROLAC TROMETHAMINE 30 MG/ML IJ SOLN
INTRAMUSCULAR | Status: DC | PRN
  Administered 2018-03-30: 30 mg via INTRAVENOUS

## 2018-03-30 MED ORDER — DEXTROSE IN LACTATED RINGERS 5 % IV SOLN: INTRAVENOUS | Status: DC

## 2018-03-30 MED ORDER — BUTORPHANOL TARTRATE 1 MG/ML IJ SOLN: 1.0000 mg | INTRAMUSCULAR | Status: DC | PRN

## 2018-03-30 MED ORDER — HYDROMORPHONE HCL 1 MG/ML IJ SOLN: 0.2500 mg | INTRAMUSCULAR | Status: DC | PRN

## 2018-03-30 MED ORDER — ONDANSETRON HCL 4 MG PO TABS: 4.0000 mg | ORAL_TABLET | Freq: Four times a day (QID) | ORAL | Status: DC | PRN

## 2018-03-30 MED ORDER — MIDAZOLAM HCL 2 MG/2ML IJ SOLN
INTRAMUSCULAR | Status: DC | PRN
  Administered 2018-03-30: 2 mg via INTRAVENOUS

## 2018-03-30 MED ORDER — GLYCOPYRROLATE 0.2 MG/ML IJ SOLN
INTRAMUSCULAR | Status: DC | PRN
  Administered 2018-03-30: 0.1 mg via INTRAVENOUS

## 2018-03-30 MED ORDER — OXYTOCIN 10 UNIT/ML IJ SOLN
INTRAVENOUS | Status: DC | PRN
  Administered 2018-03-30: 40 [IU] via INTRAVENOUS

## 2018-03-30 MED ORDER — DEXAMETHASONE SODIUM PHOSPHATE 10 MG/ML IJ SOLN
INTRAMUSCULAR | Status: DC | PRN
  Administered 2018-03-30: 10 mg via INTRAVENOUS

## 2018-03-30 MED ORDER — DEXTROSE IN LACTATED RINGERS 5 % IV SOLN
INTRAVENOUS | Status: DC
  Administered 2018-03-30: 03:00:00 via INTRAVENOUS

## 2018-03-30 MED ORDER — ONDANSETRON HCL 4 MG/2ML IJ SOLN: 4.0000 mg | Freq: Four times a day (QID) | INTRAMUSCULAR | Status: DC | PRN

## 2018-03-30 MED ORDER — MEPERIDINE HCL 25 MG/ML IJ SOLN: 6.2500 mg | INTRAMUSCULAR | Status: DC | PRN

## 2018-03-30 MED ORDER — PROPOFOL 10 MG/ML IV BOLUS
INTRAVENOUS | Status: DC | PRN
  Administered 2018-03-30: 200 mg via INTRAVENOUS

## 2018-03-30 MED ORDER — KETOROLAC TROMETHAMINE 30 MG/ML IJ SOLN: 30.0000 mg | Freq: Four times a day (QID) | INTRAMUSCULAR | Status: DC

## 2018-03-30 MED ORDER — LIDOCAINE HCL (CARDIAC) PF 100 MG/5ML IV SOSY
PREFILLED_SYRINGE | INTRAVENOUS | Status: DC | PRN
  Administered 2018-03-30: 100 mg via INTRAVENOUS

## 2018-03-30 MED ORDER — ACETAMINOPHEN 10 MG/ML IV SOLN: 1000.0000 mg | Freq: Once | INTRAVENOUS | Status: DC | PRN

## 2018-03-30 MED ORDER — FERROUS SULFATE 325 (65 FE) MG PO TABS: 325.0000 mg | ORAL_TABLET | Freq: Two times a day (BID) | ORAL | 0 refills | Status: DC

## 2018-03-30 MED ORDER — SUCCINYLCHOLINE CHLORIDE 20 MG/ML IJ SOLN
INTRAMUSCULAR | Status: DC | PRN
  Administered 2018-03-30: 100 mg via INTRAVENOUS

## 2018-03-30 MED ORDER — ONDANSETRON HCL 4 MG/2ML IJ SOLN
INTRAMUSCULAR | Status: DC | PRN
  Administered 2018-03-30: 4 mg via INTRAVENOUS

## 2018-03-30 MED ORDER — SUGAMMADEX SODIUM 200 MG/2ML IV SOLN
INTRAVENOUS | Status: DC | PRN
  Administered 2018-03-30: 172.4 mg via INTRAVENOUS

## 2018-03-30 MED ORDER — OXYTOCIN 10 UNIT/ML IJ SOLN
INTRAMUSCULAR | Status: AC
  Filled 2018-03-30: qty 4

## 2018-03-30 MED ORDER — ROCURONIUM BROMIDE 100 MG/10ML IV SOLN
INTRAVENOUS | Status: DC | PRN
  Administered 2018-03-30: 30 mg via INTRAVENOUS

## 2018-03-30 MED ORDER — KETOROLAC TROMETHAMINE 30 MG/ML IJ SOLN: 30.0000 mg | Freq: Once | INTRAMUSCULAR | Status: DC

## 2018-03-30 MED ORDER — ONDANSETRON HCL 4 MG/2ML IJ SOLN: 4.0000 mg | Freq: Once | INTRAMUSCULAR | Status: DC | PRN

## 2018-03-30 MED ORDER — PRENATAL MULTIVITAMIN CH: 1.0000 | ORAL_TABLET | Freq: Every day | ORAL | Status: DC

## 2018-03-30 MED ORDER — HYDROCODONE-ACETAMINOPHEN 7.5-325 MG PO TABS: 1.0000 | ORAL_TABLET | Freq: Once | ORAL | Status: DC | PRN

## 2018-03-30 SURGICAL SUPPLY — 13 items
CATH ROBINSON RED A/P 16FR (CATHETERS) ×3 IMPLANT
CNTNR SPEC C3OZ STD GRAD LEK (MISCELLANEOUS) ×1 IMPLANT
CONT SPEC 3OZ W/LID STRL (MISCELLANEOUS) ×2
GLOVE BIO SURGEON STRL SZ7 (GLOVE) ×3 IMPLANT
GLOVE BIOGEL PI IND STRL 7.0 (GLOVE) ×1 IMPLANT
GLOVE BIOGEL PI INDICATOR 7.0 (GLOVE) ×2
GOWN STRL REUS W/TWL LRG LVL3 (GOWN DISPOSABLE) ×6 IMPLANT
PACK VAGINAL MINOR WOMEN LF (CUSTOM PROCEDURE TRAY) ×3 IMPLANT
PAD OB MATERNITY 4.3X12.25 (PERSONAL CARE ITEMS) ×3 IMPLANT
PAD PREP 24X48 CUFFED NSTRL (MISCELLANEOUS) ×3 IMPLANT
SET BERKELEY SUCTION TUBING (SUCTIONS) ×3 IMPLANT
TOWEL OR 17X24 6PK STRL BLUE (TOWEL DISPOSABLE) ×6 IMPLANT
VACURETTE 10 RIGID CVD (CANNULA) ×3 IMPLANT

## 2018-03-30 NOTE — Discharge Instructions (Signed)
Call MD for T>100.4, heavy vaginal bleeding, severe abdominal pain, intractable nausea and/or vomiting, or respiratory distress.  Call office to schedule postpartum visit in 6 weeks.  Pelvic rest x 6 weeks.   

## 2018-03-30 NOTE — Progress Notes (Signed)
Patient discharged home with husband. Discharge instructions reviewed, meds reviewed, follow-up discussed, reasons to seek care discussed. Pt verbalized understanding.

## 2018-03-30 NOTE — Transfer of Care (Signed)
Immediate Anesthesia Transfer of Care Note  Patient: Norma SessionsHaley L Fortin  Procedure(s) Performed: DILATATION AND EVACUATION (N/A )  Patient Location: PACU  Anesthesia Type:General  Level of Consciousness: awake, alert  and oriented  Airway & Oxygen Therapy: Patient Spontanous Breathing and Patient connected to nasal cannula oxygen  Post-op Assessment: Report given to RN and Post -op Vital signs reviewed and stable  Post vital signs: Reviewed and stable  Last Vitals:  Vitals Value Taken Time  BP    Temp    Pulse    Resp    SpO2      Last Pain:  Vitals:   03/30/18 0215  TempSrc:   PainSc: (P) 0-No pain         Complications: No apparent anesthesia complications

## 2018-03-30 NOTE — Discharge Summary (Signed)
Physician Discharge Summary  Patient ID: Norma [name] MRN: [id number] DOB/AGE: 28/12/1990 28 y.o.  Admit date: 03/29/2018 Discharge date: 03/30/2018  Admission Diagnoses: Retained placenta  Discharge Diagnoses: SAA Active Problems:   Postpartum bleeding   Retained placenta   Discharged Condition: good  Hospital Course: The patient was admitted with complaint of increased vaginal bleeding on PPD2.  She was found to have placental tissue at the introitus in MAU.  Ultrasound was performed and showed retained POC high in the uterus as well.  The patient underwent D&C.  She has done well postoperatively with scant vaginal bleeding and adequate pain control.  On HD2, she was discharged home after meeting all goals.    Consults: None  Significant Diagnostic Studies: labs: Hgb 8.2->7.8  Treatments: surgery: D&C  Discharge Exam: Blood pressure 115/66, pulse 96, temperature 98.6 F (37 C), temperature source Oral, resp. rate 18, height 5\' 7"  (1.702 m), weight 190 lb 1.9 oz (86.2 kg), SpO2 99 %, currently breastfeeding. General appearance: alert, cooperative and appears stated age Extremities: extremities normal, atraumatic, no cyanosis or edema Pelvic: firm fundus, scant lochia  Disposition: Discharge disposition: 01-Home or Self Care        Allergies as of 03/30/2018   No Known Allergies     Medication List    TAKE these medications   acetaminophen 500 MG tablet Commonly known as:  TYLENOL Take 1,000 mg by mouth every 6 (six) hours as needed for mild pain.   ferrous sulfate 325 (65 FE) MG tablet Take 1 tablet (325 mg total) by mouth 2 (two) times daily with a meal.   prenatal multivitamin Tabs tablet Take 1 tablet by mouth daily at 12 noon.        SignedMitchel Honour: Nyjai Graff 03/30/2018, 10:51 AM

## 2018-03-30 NOTE — Op Note (Signed)
Preoperative diagnosis: Retained products of conception, postpartum bleeding  Postoperative diagnosis: Same  Procedure: Exam under anesthesia, dilatation and evacuation  Surgeon: Marcelle OverlieHolland  Anesthesia: General  EBL: 200 cc including a blood clot  Procedure and findings:  Patient taken to the operating room after an adequate level of general anesthesia was obtained with the patient's legs in stirrups the perineum and vagina were prepped and draped in the bladder was drained appropriate timeouts were taken at that point.  EUA was carried out the uterus was 16 weeks size, there was tissue at the ostium placenta had been removed previously in MAU.     Weighted speculum was positioned and the anterior cervical lip was grasped with a ring forcep, tissue at the office initially was removed with a ring forcep, suction curette was then used to perform suction D and E moderate to large amount of tissue was removed with no further tissue could be removed over size banjo curette was used to explore the cavity revealing it to be clean there was minimal bleeding and the uterus was firm Pitocin was running throughout the case.  She tolerated this well went to recovery room in good condition.  Dictated with Dragon Medical 1  Meriel Picaichard M Savonna Birchmeier MD

## 2018-03-30 NOTE — Progress Notes (Signed)
No current c/o.  Scant VB.  No pain.  Denies lightheadedness/dizziness on standing.  No HA.  H/o chronic anemia with pre-delivery Hgb 10.9 and discharge Hgb 8.2.   VSS. AF.  Hgb this AM 7.8  Gen: A&O x 3 Abd: soft, NT Pelvic: scan lochia Ext: no c/c/e  PPD#3/POD#1 s/p D&C for retained POC -Patient meeting goals for d/c.  Will d/c home. -ABL on chronic anemia-iron supplementation.  Norma HonourMegan Luis Nickles, DO

## 2018-03-30 NOTE — Addendum Note (Signed)
Addendum  created 03/30/18 0949 by Earmon PhoenixWilkerson, Darien Kading P, CRNA   Sign clinical note

## 2018-03-30 NOTE — Anesthesia Postprocedure Evaluation (Signed)
Anesthesia Post Note  Patient: Norma [name]  Procedure(s) Performed: DILATATION AND EVACUATION (N/A )     Patient location during evaluation: PACU Anesthesia Type: General Level of consciousness: awake and alert Pain management: pain level controlled Vital Signs Assessment: post-procedure vital signs reviewed and stable Respiratory status: spontaneous breathing, nonlabored ventilation, respiratory function stable and patient connected to nasal cannula oxygen Cardiovascular status: blood pressure returned to baseline and stable Postop Assessment: no apparent nausea or vomiting Anesthetic complications: no    Last Vitals:  Vitals:   03/30/18 0415 03/30/18 0544  BP: 111/68 115/71  Pulse: 96 (!) 108  Resp: 18 18  Temp: (!) 36.4 C 37 C  SpO2: 98% 100%    Last Pain:  Vitals:   03/30/18 0544  TempSrc: Oral  PainSc:    Pain Goal: Patients Stated Pain Goal: 2 (03/30/18 0415)               Trevor IhaStephen A Lucca Greggs

## 2018-03-30 NOTE — Anesthesia Postprocedure Evaluation (Signed)
Anesthesia Post Note  Patient: Norma [name]  Procedure(s) Performed: DILATATION AND EVACUATION (N/A )     Patient location during evaluation: Mother Baby Anesthesia Type: General Level of consciousness: awake Pain management: pain level controlled Vital Signs Assessment: post-procedure vital signs reviewed and stable Respiratory status: spontaneous breathing Cardiovascular status: stable Postop Assessment: no apparent nausea or vomiting and adequate PO intake Anesthetic complications: no    Last Vitals:  Vitals:   03/30/18 0544 03/30/18 0700  BP: 115/71 115/66  Pulse: (!) 108 96  Resp: 18 18  Temp: 37 C   SpO2: 100% 99%    Last Pain:  Vitals:   03/30/18 0933  TempSrc:   PainSc: 1    Pain Goal: Patients Stated Pain Goal: 2 (03/30/18 0933)               Edison PaceWILKERSON,Tyrika Newman

## 2018-03-31 ENCOUNTER — Encounter (HOSPITAL_COMMUNITY): Payer: Self-pay | Admitting: Obstetrics and Gynecology

## 2018-04-11 DIAGNOSIS — K5900 Constipation, unspecified: Secondary | ICD-10-CM | POA: Diagnosis not present

## 2018-04-11 DIAGNOSIS — D509 Iron deficiency anemia, unspecified: Secondary | ICD-10-CM | POA: Diagnosis not present

## 2018-04-19 DIAGNOSIS — N393 Stress incontinence (female) (male): Secondary | ICD-10-CM | POA: Diagnosis not present

## 2018-04-19 DIAGNOSIS — M6281 Muscle weakness (generalized): Secondary | ICD-10-CM | POA: Diagnosis not present

## 2018-04-19 DIAGNOSIS — K5900 Constipation, unspecified: Secondary | ICD-10-CM | POA: Diagnosis not present

## 2018-04-19 DIAGNOSIS — N819 Female genital prolapse, unspecified: Secondary | ICD-10-CM | POA: Diagnosis not present

## 2018-04-24 DIAGNOSIS — N819 Female genital prolapse, unspecified: Secondary | ICD-10-CM | POA: Diagnosis not present

## 2018-04-24 DIAGNOSIS — N393 Stress incontinence (female) (male): Secondary | ICD-10-CM | POA: Diagnosis not present

## 2018-04-24 DIAGNOSIS — M6281 Muscle weakness (generalized): Secondary | ICD-10-CM | POA: Diagnosis not present

## 2018-04-24 DIAGNOSIS — K5900 Constipation, unspecified: Secondary | ICD-10-CM | POA: Diagnosis not present

## 2018-05-08 DIAGNOSIS — N393 Stress incontinence (female) (male): Secondary | ICD-10-CM | POA: Diagnosis not present

## 2018-05-08 DIAGNOSIS — M6281 Muscle weakness (generalized): Secondary | ICD-10-CM | POA: Diagnosis not present

## 2018-05-08 DIAGNOSIS — K5900 Constipation, unspecified: Secondary | ICD-10-CM | POA: Diagnosis not present

## 2018-05-08 DIAGNOSIS — N819 Female genital prolapse, unspecified: Secondary | ICD-10-CM | POA: Diagnosis not present

## 2018-05-10 DIAGNOSIS — Z1389 Encounter for screening for other disorder: Secondary | ICD-10-CM | POA: Diagnosis not present

## 2018-05-12 ENCOUNTER — Inpatient Hospital Stay (HOSPITAL_COMMUNITY): Admission: AD | Admit: 2018-05-12 | Payer: Self-pay | Source: Ambulatory Visit | Admitting: Obstetrics and Gynecology

## 2018-05-30 DIAGNOSIS — Z30430 Encounter for insertion of intrauterine contraceptive device: Secondary | ICD-10-CM | POA: Diagnosis not present

## 2018-05-30 DIAGNOSIS — Z3202 Encounter for pregnancy test, result negative: Secondary | ICD-10-CM | POA: Diagnosis not present

## 2018-05-31 DIAGNOSIS — K5900 Constipation, unspecified: Secondary | ICD-10-CM | POA: Diagnosis not present

## 2018-05-31 DIAGNOSIS — N8189 Other female genital prolapse: Secondary | ICD-10-CM | POA: Diagnosis not present

## 2018-05-31 DIAGNOSIS — N3946 Mixed incontinence: Secondary | ICD-10-CM | POA: Diagnosis not present

## 2018-05-31 DIAGNOSIS — N3945 Continuous leakage: Secondary | ICD-10-CM | POA: Diagnosis not present

## 2018-05-31 DIAGNOSIS — N816 Rectocele: Secondary | ICD-10-CM | POA: Diagnosis not present

## 2018-06-20 DIAGNOSIS — N393 Stress incontinence (female) (male): Secondary | ICD-10-CM | POA: Diagnosis not present

## 2018-06-20 DIAGNOSIS — N939 Abnormal uterine and vaginal bleeding, unspecified: Secondary | ICD-10-CM | POA: Diagnosis not present

## 2018-06-20 DIAGNOSIS — Z30431 Encounter for routine checking of intrauterine contraceptive device: Secondary | ICD-10-CM | POA: Diagnosis not present

## 2018-06-20 DIAGNOSIS — N819 Female genital prolapse, unspecified: Secondary | ICD-10-CM | POA: Diagnosis not present

## 2018-06-20 DIAGNOSIS — R102 Pelvic and perineal pain: Secondary | ICD-10-CM | POA: Diagnosis not present

## 2018-06-21 DIAGNOSIS — K5900 Constipation, unspecified: Secondary | ICD-10-CM | POA: Diagnosis not present

## 2018-06-21 DIAGNOSIS — N393 Stress incontinence (female) (male): Secondary | ICD-10-CM | POA: Diagnosis not present

## 2018-06-21 DIAGNOSIS — N819 Female genital prolapse, unspecified: Secondary | ICD-10-CM | POA: Diagnosis not present

## 2018-06-21 DIAGNOSIS — M6281 Muscle weakness (generalized): Secondary | ICD-10-CM | POA: Diagnosis not present

## 2018-07-03 DIAGNOSIS — M6281 Muscle weakness (generalized): Secondary | ICD-10-CM | POA: Diagnosis not present

## 2018-07-03 DIAGNOSIS — N819 Female genital prolapse, unspecified: Secondary | ICD-10-CM | POA: Diagnosis not present

## 2018-07-03 DIAGNOSIS — K5900 Constipation, unspecified: Secondary | ICD-10-CM | POA: Diagnosis not present

## 2018-07-03 DIAGNOSIS — N393 Stress incontinence (female) (male): Secondary | ICD-10-CM | POA: Diagnosis not present

## 2018-07-04 DIAGNOSIS — N393 Stress incontinence (female) (male): Secondary | ICD-10-CM | POA: Diagnosis not present

## 2018-07-12 DIAGNOSIS — N819 Female genital prolapse, unspecified: Secondary | ICD-10-CM | POA: Diagnosis not present

## 2018-07-12 DIAGNOSIS — K5900 Constipation, unspecified: Secondary | ICD-10-CM | POA: Diagnosis not present

## 2018-07-12 DIAGNOSIS — N393 Stress incontinence (female) (male): Secondary | ICD-10-CM | POA: Diagnosis not present

## 2018-07-12 DIAGNOSIS — M6281 Muscle weakness (generalized): Secondary | ICD-10-CM | POA: Diagnosis not present

## 2018-07-19 DIAGNOSIS — M6281 Muscle weakness (generalized): Secondary | ICD-10-CM | POA: Diagnosis not present

## 2018-07-19 DIAGNOSIS — K5900 Constipation, unspecified: Secondary | ICD-10-CM | POA: Diagnosis not present

## 2018-07-19 DIAGNOSIS — N393 Stress incontinence (female) (male): Secondary | ICD-10-CM | POA: Diagnosis not present

## 2018-07-19 DIAGNOSIS — N819 Female genital prolapse, unspecified: Secondary | ICD-10-CM | POA: Diagnosis not present

## 2018-07-26 DIAGNOSIS — N393 Stress incontinence (female) (male): Secondary | ICD-10-CM | POA: Diagnosis not present

## 2018-07-26 DIAGNOSIS — K5900 Constipation, unspecified: Secondary | ICD-10-CM | POA: Diagnosis not present

## 2018-07-26 DIAGNOSIS — M6281 Muscle weakness (generalized): Secondary | ICD-10-CM | POA: Diagnosis not present

## 2018-07-26 DIAGNOSIS — N819 Female genital prolapse, unspecified: Secondary | ICD-10-CM | POA: Diagnosis not present

## 2018-07-27 DIAGNOSIS — N393 Stress incontinence (female) (male): Secondary | ICD-10-CM | POA: Diagnosis not present

## 2018-07-27 DIAGNOSIS — F419 Anxiety disorder, unspecified: Secondary | ICD-10-CM | POA: Diagnosis not present

## 2018-07-27 DIAGNOSIS — N939 Abnormal uterine and vaginal bleeding, unspecified: Secondary | ICD-10-CM | POA: Diagnosis not present

## 2018-07-27 DIAGNOSIS — R102 Pelvic and perineal pain: Secondary | ICD-10-CM | POA: Diagnosis not present

## 2018-08-01 DIAGNOSIS — M25775 Osteophyte, left foot: Secondary | ICD-10-CM | POA: Diagnosis not present

## 2018-08-01 DIAGNOSIS — M79674 Pain in right toe(s): Secondary | ICD-10-CM | POA: Diagnosis not present

## 2018-08-01 DIAGNOSIS — M79675 Pain in left toe(s): Secondary | ICD-10-CM | POA: Diagnosis not present

## 2018-08-01 DIAGNOSIS — M25774 Osteophyte, right foot: Secondary | ICD-10-CM | POA: Diagnosis not present

## 2018-08-01 DIAGNOSIS — L600 Ingrowing nail: Secondary | ICD-10-CM | POA: Diagnosis not present

## 2018-08-02 DIAGNOSIS — N819 Female genital prolapse, unspecified: Secondary | ICD-10-CM | POA: Diagnosis not present

## 2018-08-02 DIAGNOSIS — N393 Stress incontinence (female) (male): Secondary | ICD-10-CM | POA: Diagnosis not present

## 2018-08-02 DIAGNOSIS — K5900 Constipation, unspecified: Secondary | ICD-10-CM | POA: Diagnosis not present

## 2018-08-02 DIAGNOSIS — M6281 Muscle weakness (generalized): Secondary | ICD-10-CM | POA: Diagnosis not present

## 2018-08-16 DIAGNOSIS — N393 Stress incontinence (female) (male): Secondary | ICD-10-CM | POA: Diagnosis not present

## 2018-08-16 DIAGNOSIS — M6281 Muscle weakness (generalized): Secondary | ICD-10-CM | POA: Diagnosis not present

## 2018-08-16 DIAGNOSIS — N819 Female genital prolapse, unspecified: Secondary | ICD-10-CM | POA: Diagnosis not present

## 2018-08-16 DIAGNOSIS — K5900 Constipation, unspecified: Secondary | ICD-10-CM | POA: Diagnosis not present

## 2018-08-23 DIAGNOSIS — M6281 Muscle weakness (generalized): Secondary | ICD-10-CM | POA: Diagnosis not present

## 2018-08-23 DIAGNOSIS — N819 Female genital prolapse, unspecified: Secondary | ICD-10-CM | POA: Diagnosis not present

## 2018-08-23 DIAGNOSIS — N393 Stress incontinence (female) (male): Secondary | ICD-10-CM | POA: Diagnosis not present

## 2018-08-23 DIAGNOSIS — K5900 Constipation, unspecified: Secondary | ICD-10-CM | POA: Diagnosis not present

## 2018-08-30 DIAGNOSIS — M6281 Muscle weakness (generalized): Secondary | ICD-10-CM | POA: Diagnosis not present

## 2018-08-30 DIAGNOSIS — N819 Female genital prolapse, unspecified: Secondary | ICD-10-CM | POA: Diagnosis not present

## 2018-08-30 DIAGNOSIS — N393 Stress incontinence (female) (male): Secondary | ICD-10-CM | POA: Diagnosis not present

## 2018-08-30 DIAGNOSIS — K5900 Constipation, unspecified: Secondary | ICD-10-CM | POA: Diagnosis not present

## 2018-09-05 NOTE — H&P (Addendum)
Norma [name] is an 27 y.o. female. G3P3 p/w pelvic pressure postpartum, improved minimally with PFPT. She underwent UDS which were WNL. She had a mIUD placed and continues to have AUB even after 4 months pp.  She elected to have a TVH A/P repair cysto, possible BS and possible McCalls vs USLS.  Pertinent Gynecological History: Menses: flow is moderate Bleeding: intermenstrual bleeding and dysfunctional uterine bleeding Contraception: IUD DES exposure: denies Blood transfusions: none Sexually transmitted diseases: no past history Previous GYN Procedures: n/a  Last mammogram: n/a Date: n/a Last pap: normal Date: 2019 OB History: G3, P3   Menstrual History: No LMP recorded.    Past Medical History:  Diagnosis Date  . Anemia   . Asthma    exercise induced  . Dysmenorrhea   . Right ureteral stone     Past Surgical History:  Procedure Laterality Date  . ADENOIDECTOMY    . DILATION AND CURETTAGE OF UTERUS N/A 03/30/2018   Procedure: DILATATION AND EVACUATION;  Surgeon: Richarda OverlieHolland, Richard, MD;  Location: [facility];  Service: Gynecology;  Laterality: N/A;  . TONSILLECTOMY    . WISDOM TOOTH EXTRACTION      No family history on file.  Social History:  reports that she has never smoked. She has never used smokeless tobacco. She reports current alcohol use. She reports that she does not use drugs.  Allergies: No Known Allergies  No medications prior to admission.     ROS  currently breastfeeding. Physical Exam Gen: well appearing, NAD CV: Reg rate Pulm: NWOB Abd: soft, nondistended, nontender, no masses GYN: 8cm uterus w/out significant prolapse, Stage 1 anterior and posterior prolapse, CST NEG, no adnexa ttp/CMT Ext: No edema b/l    UDS WNL, PVR 100 No results found for this or any previous visit (from the past 24 hour(s)).  No results found.  Assessment/Plan: 27yo G3P3 presenting for scheduled TVH A/P repair cysto, possible BS and possible McCalls vs USLS. She has a  h/o pelvic pressure unrelieved with PFPT, AUB with mIUD, and unrelenting pelvic pressure. UDS WNL and no SUI. On exam, she has anterior and posterior prolapse but no cervical-uterine prolapse. Thus, will plan for above surgery but USLS vs McCalls culdoplasty depending on exam with relaxation in OR.  Risks discussed including infection, bleeding, damage to surrounding structures, need for additional procedures, postoperative DVT, recurrent prolapse, failure to relieve pelvic pressure, recurrent UI, and the possibility of regret given <30 yo. All questions answered. Consent signed in the office. 2g ancef on call to OR.    No updates to above H&P. Patient arrived NPO and was consented in PACU. Risks again discussed, all questions answered, and consent signed. Proceed with above surgery.   Norma AgeeElise Tzipora Mcinroy MD   Norma [name] 09/05/2018, 1:51 PM

## 2018-09-11 DIAGNOSIS — N393 Stress incontinence (female) (male): Secondary | ICD-10-CM | POA: Diagnosis not present

## 2018-09-11 DIAGNOSIS — K5900 Constipation, unspecified: Secondary | ICD-10-CM | POA: Diagnosis not present

## 2018-09-11 DIAGNOSIS — M6281 Muscle weakness (generalized): Secondary | ICD-10-CM | POA: Diagnosis not present

## 2018-09-11 DIAGNOSIS — N819 Female genital prolapse, unspecified: Secondary | ICD-10-CM | POA: Diagnosis not present

## 2018-09-15 NOTE — Patient Instructions (Addendum)
Norma [name]  09/15/2018     Your procedure is scheduled on 09-25-18   Report to [facility]  at  5:30 A.M.   Call this number if you have problems the morning of surgery:[phone]   OUR ADDRESS IS [address], WE ARE LOCATED IN THE [facility] WITH [facility].   Remember:NO SOLID FOOD AFTER MIDNIGHT THE NIGHT PRIOR TO SURGERY. NOTHING BY MOUTH EXCEPT CLEAR LIQUIDS UNTIL 3 HOURS PRIOR TO SCHEULED SURGERY. PLEASE FINISH ENSURE DRINK PER SURGEON ORDER 3 HOURS PRIOR TO SCHEDULED SURGERY TIME WHICH NEEDS TO BE COMPLETED AT 4:30 AM.     CLEAR LIQUID DIET   Foods Allowed                                                                     Foods Excluded  Coffee and tea, regular and decaf                             liquids that you cannot  Plain Jell-O in any flavor                                             see through such as: Fruit ices (not with fruit pulp)                                     milk, soups, orange juice  Iced Popsicles                                    All solid food Carbonated beverages, regular and diet                                    Cranberry, grape and apple juices Sports drinks like Gatorade Lightly seasoned clear broth or consume(fat free) Sugar, honey syrup  Sample Menu Breakfast                                Lunch                                     Supper Cranberry juice                    Beef broth                            Chicken broth Jell-O                                     Grape juice  Apple juice Coffee or tea                        Jell-O                                      Popsicle                                                Coffee or tea                        Coffee or tea  _____________________________________________________________________    Take these medicines the morning of surgery with A SIP OF WATER: Sertraline (Zoloft)   Do not wear jewelry, make-up or  nail polish.  Do not wear lotions, powders, or perfumes, or deoderant.  Do not shave 48 hours prior to surgery.    Do not bring valuables to the hospital.  [facility] is not responsible for any belongings or valuables.  Contacts, dentures or bridgework may not be worn into surgery.  Leave your suitcase in the car.  After surgery it may be brought to your room.  For patients admitted to the hospital, discharge time will be determined by your treatment team.  Patients discharged the day of surgery will not be allowed to drive home.   Special instructions:  Bring your medication in the original pill bottle.  Please read over the following fact sheets that you were given:       [facility] - Preparing for Surgery Before surgery, you can play an important role.  Because skin is not sterile, your skin needs to be as free of germs as possible.  You can reduce the number of germs on your skin by washing with CHG (chlorahexidine gluconate) soap before surgery.  CHG is an antiseptic cleaner which kills germs and bonds with the skin to continue killing germs even after washing. Please DO NOT use if you have an allergy to CHG or antibacterial soaps.  If your skin becomes reddened/irritated stop using the CHG and inform your nurse when you arrive at Short Stay. Do not shave (including legs and underarms) for at least 48 hours prior to the first CHG shower.  You may shave your face/neck. Please follow these instructions carefully:  1.  Shower with CHG Soap the night before surgery and the  morning of Surgery.  2.  If you choose to wash your hair, wash your hair first as usual with your  normal  shampoo.  3.  After you shampoo, rinse your hair and body thoroughly to remove the  shampoo.                           4.  Use CHG as you would any other liquid soap.  You can apply chg directly  to the skin and wash                       Gently with a scrungie or clean washcloth.  5.  Apply the CHG Soap to  your body ONLY FROM THE NECK DOWN.   Do not use on face/ open  Wound or open sores. Avoid contact with eyes, ears mouth and genitals (private parts).                       Wash face,  Genitals (private parts) with your normal soap.             6.  Wash thoroughly, paying special attention to the area where your surgery  will be performed.  7.  Thoroughly rinse your body with warm water from the neck down.  8.  DO NOT shower/wash with your normal soap after using and rinsing off  the CHG Soap.                9.  Pat yourself dry with a clean towel.            10.  Wear clean pajamas.            11.  Place clean sheets on your bed the night of your first shower and do not  sleep with pets. Day of Surgery : Do not apply any lotions/deodorants the morning of surgery.  Please wear clean clothes to the hospital/surgery center.  FAILURE TO FOLLOW THESE INSTRUCTIONS MAY RESULT IN THE CANCELLATION OF YOUR SURGERY PATIENT SIGNATURE_________________________________  NURSE SIGNATURE__________________________________  ________________________________________________________________________

## 2018-09-19 ENCOUNTER — Encounter (HOSPITAL_COMMUNITY)
Admission: RE | Admit: 2018-09-19 | Discharge: 2018-09-19 | Disposition: A | Discharge status: Home / Self Care | Payer: BLUE CROSS/BLUE SHIELD | Source: Ambulatory Visit | Attending: Obstetrics and Gynecology | Admitting: Obstetrics and Gynecology

## 2018-09-19 ENCOUNTER — Other Ambulatory Visit: Payer: Self-pay

## 2018-09-19 ENCOUNTER — Encounter (HOSPITAL_COMMUNITY): Payer: Self-pay

## 2018-09-19 DIAGNOSIS — Z01812 Encounter for preprocedural laboratory examination: Secondary | ICD-10-CM | POA: Insufficient documentation

## 2018-09-19 LAB — CBC
HCT: 41.8 % (ref 36.0–46.0)
Hemoglobin: 13.6 g/dL (ref 12.0–15.0)
MCH: 31.6 pg (ref 26.0–34.0)
MCHC: 32.5 g/dL (ref 30.0–36.0)
MCV: 97.2 fL (ref 80.0–100.0)
PLATELETS: 198 10*3/uL (ref 150–400)
RBC: 4.3 MIL/uL (ref 3.87–5.11)
RDW: 12.7 % (ref 11.5–15.5)
WBC: 6.2 10*3/uL (ref 4.0–10.5)
nRBC: 0 % (ref 0.0–0.2)

## 2018-09-19 LAB — ABO/RH: ABO/RH(D): B POS

## 2018-09-20 DIAGNOSIS — F411 Generalized anxiety disorder: Secondary | ICD-10-CM | POA: Diagnosis not present

## 2018-09-20 DIAGNOSIS — F429 Obsessive-compulsive disorder, unspecified: Secondary | ICD-10-CM | POA: Diagnosis not present

## 2018-09-20 DIAGNOSIS — N393 Stress incontinence (female) (male): Secondary | ICD-10-CM | POA: Diagnosis not present

## 2018-09-20 NOTE — Progress Notes (Addendum)
09-20-18 Pt provided written instructions, in error,  to report to [facility] for her surgery on 09-25-18. Contacted patient via telephone to advise patient to report to the correct location, which is [facility] Admitting at 5:30 AM on 09-25-18. Voice message left..Awaiting return call from patient.   09-21-18 Second voice message left advising patient of surgery location. Awaiting a return call

## 2018-09-24 NOTE — Anesthesia Preprocedure Evaluation (Addendum)
Anesthesia Evaluation  Patient identified by MRN, date of birth, ID band Patient awake    Reviewed: Allergy & Precautions, NPO status , Patient's Chart, lab work & pertinent test results  Airway Mallampati: II  TM Distance: >3 FB Neck ROM: Full    Dental no notable dental hx. (+) Teeth Intact, Dental Advisory Given   Pulmonary asthma ,    Pulmonary exam normal breath sounds clear to auscultation       Cardiovascular Exercise Tolerance: Good Normal cardiovascular exam Rhythm:Regular Rate:Normal     Neuro/Psych negative neurological ROS  negative psych ROS   GI/Hepatic negative GI ROS, Neg liver ROS,   Endo/Other    Renal/GU negative Renal ROS     Musculoskeletal negative musculoskeletal ROS (+)   Abdominal   Peds  Hematology  (+) anemia ,   Anesthesia Other Findings   Reproductive/Obstetrics negative OB ROS                            Lab Results  Component Value Date   CREATININE 0.73 03/18/2014   BUN 9 03/18/2014   NA 142 03/18/2014   K 3.6 (L) 03/18/2014   CL 104 03/18/2014   CO2 25 03/18/2014    Lab Results  Component Value Date   WBC 6.2 09/19/2018   HGB 13.6 09/19/2018   HCT 41.8 09/19/2018   MCV 97.2 09/19/2018   PLT 198 09/19/2018    Anesthesia Physical Anesthesia Plan  ASA: II  Anesthesia Plan: General   Post-op Pain Management:    Induction: Intravenous  PONV Risk Score and Plan: 4 or greater and Scopolamine patch - Pre-op, Midazolam, Dexamethasone, Ondansetron and Treatment may vary due to age or medical condition  Airway Management Planned: Oral ETT  Additional Equipment:   Intra-op Plan:   Post-operative Plan: Extubation in OR  Informed Consent: I have reviewed the patients History and Physical, chart, labs and discussed the procedure including the risks, benefits and alternatives for the proposed anesthesia with the patient or authorized  representative who has indicated his/her understanding and acceptance.   Dental advisory given  Plan Discussed with: CRNA  Anesthesia Plan Comments:        Anesthesia Quick Evaluation

## 2018-09-25 ENCOUNTER — Observation Stay (HOSPITAL_COMMUNITY): Payer: BLUE CROSS/BLUE SHIELD | Admitting: Anesthesiology

## 2018-09-25 ENCOUNTER — Observation Stay (HOSPITAL_COMMUNITY): Payer: BLUE CROSS/BLUE SHIELD | Admitting: Physician Assistant

## 2018-09-25 ENCOUNTER — Other Ambulatory Visit: Payer: Self-pay

## 2018-09-25 ENCOUNTER — Encounter (HOSPITAL_COMMUNITY): Payer: Self-pay | Admitting: *Deleted

## 2018-09-25 ENCOUNTER — Observation Stay (HOSPITAL_COMMUNITY)
Admission: RE | Admit: 2018-09-25 | Discharge: 2018-09-26 | Disposition: A | Discharge status: Home / Self Care | Payer: BLUE CROSS/BLUE SHIELD | Source: Other Acute Inpatient Hospital | Attending: Obstetrics and Gynecology | Admitting: Obstetrics and Gynecology

## 2018-09-25 ENCOUNTER — Encounter (HOSPITAL_COMMUNITY)
Admission: RE | Disposition: A | Discharge status: Home / Self Care | Payer: Self-pay | Source: Other Acute Inpatient Hospital | Attending: Obstetrics and Gynecology

## 2018-09-25 DIAGNOSIS — N8189 Other female genital prolapse: Secondary | ICD-10-CM | POA: Diagnosis not present

## 2018-09-25 DIAGNOSIS — N939 Abnormal uterine and vaginal bleeding, unspecified: Secondary | ICD-10-CM | POA: Insufficient documentation

## 2018-09-25 DIAGNOSIS — Z975 Presence of (intrauterine) contraceptive device: Secondary | ICD-10-CM | POA: Diagnosis not present

## 2018-09-25 DIAGNOSIS — N72 Inflammatory disease of cervix uteri: Secondary | ICD-10-CM | POA: Diagnosis not present

## 2018-09-25 DIAGNOSIS — N816 Rectocele: Principal | ICD-10-CM | POA: Insufficient documentation

## 2018-09-25 DIAGNOSIS — N8110 Cystocele, unspecified: Secondary | ICD-10-CM | POA: Insufficient documentation

## 2018-09-25 DIAGNOSIS — J45990 Exercise induced bronchospasm: Secondary | ICD-10-CM | POA: Insufficient documentation

## 2018-09-25 DIAGNOSIS — R102 Pelvic and perineal pain: Secondary | ICD-10-CM | POA: Insufficient documentation

## 2018-09-25 DIAGNOSIS — N814 Uterovaginal prolapse, unspecified: Secondary | ICD-10-CM | POA: Diagnosis not present

## 2018-09-25 DIAGNOSIS — Z90710 Acquired absence of both cervix and uterus: Secondary | ICD-10-CM | POA: Diagnosis present

## 2018-09-25 DIAGNOSIS — N819 Female genital prolapse, unspecified: Secondary | ICD-10-CM | POA: Diagnosis present

## 2018-09-25 HISTORY — PX: VAGINAL HYSTERECTOMY: SHX2639

## 2018-09-25 HISTORY — PX: ANTERIOR AND POSTERIOR REPAIR WITH SACROSPINOUS FIXATION: SHX6536

## 2018-09-25 HISTORY — PX: CYSTOSCOPY: SHX5120

## 2018-09-25 LAB — PREGNANCY, URINE: Preg Test, Ur: NEGATIVE

## 2018-09-25 LAB — TYPE AND SCREEN
ABO/RH(D): B POS
Antibody Screen: NEGATIVE

## 2018-09-25 SURGERY — HYSTERECTOMY, VAGINAL
Anesthesia: General | Site: Uterus

## 2018-09-25 MED ORDER — DEXTROSE-NACL 5-0.45 % IV SOLN
125.0000 mL/h | INTRAVENOUS | Status: DC
  Administered 2018-09-25 (×2): 125 mL/h via INTRAVENOUS

## 2018-09-25 MED ORDER — ONDANSETRON HCL 4 MG/2ML IJ SOLN: 4.0000 mg | Freq: Four times a day (QID) | INTRAMUSCULAR | Status: DC | PRN

## 2018-09-25 MED ORDER — CEFAZOLIN SODIUM-DEXTROSE 2-4 GM/100ML-% IV SOLN
2.0000 g | INTRAVENOUS | Status: AC
  Administered 2018-09-25 (×2): 2 g via INTRAVENOUS
  Filled 2018-09-25: qty 100

## 2018-09-25 MED ORDER — DOCUSATE SODIUM 100 MG PO CAPS
100.0000 mg | ORAL_CAPSULE | Freq: Two times a day (BID) | ORAL | Status: DC
  Administered 2018-09-25: 100 mg via ORAL
  Filled 2018-09-25: qty 1

## 2018-09-25 MED ORDER — MIDAZOLAM HCL 2 MG/2ML IJ SOLN
INTRAMUSCULAR | Status: AC
  Filled 2018-09-25: qty 2

## 2018-09-25 MED ORDER — ONDANSETRON HCL 4 MG/2ML IJ SOLN: 4.0000 mg | Freq: Once | INTRAMUSCULAR | Status: DC | PRN

## 2018-09-25 MED ORDER — ACETAMINOPHEN 10 MG/ML IV SOLN
INTRAVENOUS | Status: AC
  Filled 2018-09-25: qty 100

## 2018-09-25 MED ORDER — FENTANYL CITRATE (PF) 250 MCG/5ML IJ SOLN
INTRAMUSCULAR | Status: AC
  Filled 2018-09-25: qty 5

## 2018-09-25 MED ORDER — LIDOCAINE HCL (CARDIAC) PF 100 MG/5ML IV SOSY
PREFILLED_SYRINGE | INTRAVENOUS | Status: DC | PRN
  Administered 2018-09-25: 80 mg via INTRAVENOUS

## 2018-09-25 MED ORDER — ACETAMINOPHEN 500 MG PO TABS
1000.0000 mg | ORAL_TABLET | Freq: Once | ORAL | Status: AC
  Administered 2018-09-25: 1000 mg via ORAL
  Filled 2018-09-25: qty 2

## 2018-09-25 MED ORDER — PHENYLEPHRINE 40 MCG/ML (10ML) SYRINGE FOR IV PUSH (FOR BLOOD PRESSURE SUPPORT)
PREFILLED_SYRINGE | INTRAVENOUS | Status: AC
  Filled 2018-09-25: qty 10

## 2018-09-25 MED ORDER — ACETAMINOPHEN 500 MG PO TABS
1000.0000 mg | ORAL_TABLET | Freq: Four times a day (QID) | ORAL | Status: DC
  Administered 2018-09-25 – 2018-09-26 (×2): 1000 mg via ORAL
  Filled 2018-09-25 (×3): qty 2

## 2018-09-25 MED ORDER — INFLUENZA VAC SPLIT QUAD 0.5 ML IM SUSY: 0.5000 mL | PREFILLED_SYRINGE | INTRAMUSCULAR | Status: DC

## 2018-09-25 MED ORDER — HYDROMORPHONE HCL 1 MG/ML IJ SOLN
0.2500 mg | INTRAMUSCULAR | Status: DC | PRN
  Administered 2018-09-25 (×4): 0.25 mg via INTRAVENOUS

## 2018-09-25 MED ORDER — FENTANYL CITRATE (PF) 100 MCG/2ML IJ SOLN
INTRAMUSCULAR | Status: DC | PRN
  Administered 2018-09-25: 100 ug via INTRAVENOUS
  Administered 2018-09-25: 50 ug via INTRAVENOUS
  Administered 2018-09-25: 100 ug via INTRAVENOUS

## 2018-09-25 MED ORDER — ESTRADIOL 0.1 MG/GM VA CREA
TOPICAL_CREAM | VAGINAL | Status: AC
  Filled 2018-09-25: qty 42.5

## 2018-09-25 MED ORDER — STERILE WATER FOR IRRIGATION IR SOLN
Status: DC | PRN
  Administered 2018-09-25: 1000 mL

## 2018-09-25 MED ORDER — SERTRALINE HCL 25 MG PO TABS
25.0000 mg | ORAL_TABLET | Freq: Every day | ORAL | Status: DC
  Administered 2018-09-25: 25 mg via ORAL
  Filled 2018-09-25: qty 1

## 2018-09-25 MED ORDER — SENNOSIDES-DOCUSATE SODIUM 8.6-50 MG PO TABS: 1.0000 | ORAL_TABLET | Freq: Every evening | ORAL | Status: DC | PRN

## 2018-09-25 MED ORDER — ONDANSETRON HCL 4 MG PO TABS: 4.0000 mg | ORAL_TABLET | Freq: Four times a day (QID) | ORAL | Status: DC | PRN

## 2018-09-25 MED ORDER — PROPOFOL 10 MG/ML IV BOLUS
INTRAVENOUS | Status: DC | PRN
  Administered 2018-09-25: 190 mg via INTRAVENOUS

## 2018-09-25 MED ORDER — ALBUTEROL SULFATE (2.5 MG/3ML) 0.083% IN NEBU: 3.0000 mL | INHALATION_SOLUTION | Freq: Four times a day (QID) | RESPIRATORY_TRACT | Status: DC | PRN

## 2018-09-25 MED ORDER — ROCURONIUM BROMIDE 100 MG/10ML IV SOLN
INTRAVENOUS | Status: DC | PRN
  Administered 2018-09-25: 20 mg via INTRAVENOUS
  Administered 2018-09-25: 50 mg via INTRAVENOUS

## 2018-09-25 MED ORDER — PANTOPRAZOLE SODIUM 40 MG PO TBEC
40.0000 mg | DELAYED_RELEASE_TABLET | Freq: Every day | ORAL | Status: DC
  Administered 2018-09-25: 40 mg via ORAL
  Filled 2018-09-25: qty 1

## 2018-09-25 MED ORDER — HYDROCODONE-ACETAMINOPHEN 7.5-325 MG PO TABS: 1.0000 | ORAL_TABLET | Freq: Once | ORAL | Status: DC | PRN

## 2018-09-25 MED ORDER — ONDANSETRON HCL 4 MG/2ML IJ SOLN
INTRAMUSCULAR | Status: DC | PRN
  Administered 2018-09-25: 4 mg via INTRAVENOUS

## 2018-09-25 MED ORDER — 0.9 % SODIUM CHLORIDE (POUR BTL) OPTIME
TOPICAL | Status: DC | PRN
  Administered 2018-09-25: 1000 mL

## 2018-09-25 MED ORDER — PROPOFOL 10 MG/ML IV BOLUS
INTRAVENOUS | Status: AC
  Filled 2018-09-25: qty 20

## 2018-09-25 MED ORDER — GABAPENTIN 300 MG PO CAPS
300.0000 mg | ORAL_CAPSULE | Freq: Once | ORAL | Status: AC
  Administered 2018-09-25: 300 mg via ORAL
  Filled 2018-09-25: qty 1

## 2018-09-25 MED ORDER — SUGAMMADEX SODIUM 200 MG/2ML IV SOLN
INTRAVENOUS | Status: DC | PRN
  Administered 2018-09-25: 200 mg via INTRAVENOUS

## 2018-09-25 MED ORDER — MIDAZOLAM HCL 5 MG/5ML IJ SOLN
INTRAMUSCULAR | Status: DC | PRN
  Administered 2018-09-25: 2 mg via INTRAVENOUS

## 2018-09-25 MED ORDER — PHENYLEPHRINE HCL 10 MG/ML IJ SOLN
INTRAMUSCULAR | Status: DC | PRN
  Administered 2018-09-25 (×3): 80 ug via INTRAVENOUS

## 2018-09-25 MED ORDER — KETOROLAC TROMETHAMINE 30 MG/ML IJ SOLN
30.0000 mg | Freq: Once | INTRAMUSCULAR | Status: AC
  Administered 2018-09-25: 30 mg via INTRAVENOUS

## 2018-09-25 MED ORDER — KETOROLAC TROMETHAMINE 30 MG/ML IJ SOLN: 30.0000 mg | Freq: Four times a day (QID) | INTRAMUSCULAR | Status: DC

## 2018-09-25 MED ORDER — SCOPOLAMINE 1 MG/3DAYS TD PT72
1.0000 | MEDICATED_PATCH | TRANSDERMAL | Status: DC
  Administered 2018-09-25: 1.5 mg via TRANSDERMAL
  Filled 2018-09-25: qty 1

## 2018-09-25 MED ORDER — ROCURONIUM BROMIDE 10 MG/ML (PF) SYRINGE
PREFILLED_SYRINGE | INTRAVENOUS | Status: AC
  Filled 2018-09-25: qty 10

## 2018-09-25 MED ORDER — OXYCODONE HCL 5 MG PO TABS
5.0000 mg | ORAL_TABLET | ORAL | Status: DC | PRN
  Administered 2018-09-25 – 2018-09-26 (×2): 10 mg via ORAL
  Filled 2018-09-25 (×2): qty 2

## 2018-09-25 MED ORDER — HYDROMORPHONE HCL 1 MG/ML IJ SOLN
INTRAMUSCULAR | Status: AC
  Filled 2018-09-25: qty 1

## 2018-09-25 MED ORDER — LACTATED RINGERS IV SOLN
INTRAVENOUS | Status: DC
  Administered 2018-09-25 (×3): via INTRAVENOUS

## 2018-09-25 MED ORDER — ALUM & MAG HYDROXIDE-SIMETH 200-200-20 MG/5ML PO SUSP: 30.0000 mL | ORAL | Status: DC | PRN

## 2018-09-25 MED ORDER — LIDOCAINE-EPINEPHRINE (PF) 1 %-1:200000 IJ SOLN
INTRAMUSCULAR | Status: DC | PRN
  Administered 2018-09-25: 17 mL

## 2018-09-25 MED ORDER — HYDROMORPHONE HCL 2 MG/ML IJ SOLN
INTRAMUSCULAR | Status: AC
  Filled 2018-09-25: qty 1

## 2018-09-25 MED ORDER — HYDROMORPHONE HCL 1 MG/ML IJ SOLN
INTRAMUSCULAR | Status: DC | PRN
  Administered 2018-09-25: 1 mg via INTRAVENOUS

## 2018-09-25 MED ORDER — DEXAMETHASONE SODIUM PHOSPHATE 10 MG/ML IJ SOLN
INTRAMUSCULAR | Status: AC
  Filled 2018-09-25: qty 1

## 2018-09-25 MED ORDER — HYDROMORPHONE HCL 1 MG/ML IJ SOLN
0.2000 mg | INTRAMUSCULAR | Status: DC | PRN
  Administered 2018-09-25: 0.5 mg via INTRAVENOUS
  Filled 2018-09-25: qty 1

## 2018-09-25 MED ORDER — SIMETHICONE 80 MG PO CHEW: 80.0000 mg | CHEWABLE_TABLET | Freq: Four times a day (QID) | ORAL | Status: DC | PRN

## 2018-09-25 MED ORDER — VASOPRESSIN 20 UNIT/ML IV SOLN
20.0000 [IU] | Freq: Once | INTRAVENOUS | Status: DC
  Filled 2018-09-25: qty 1

## 2018-09-25 MED ORDER — SODIUM CHLORIDE (PF) 0.9 % IJ SOLN
INTRAMUSCULAR | Status: AC
  Filled 2018-09-25: qty 50

## 2018-09-25 MED ORDER — LIDOCAINE-EPINEPHRINE (PF) 1 %-1:200000 IJ SOLN
INTRAMUSCULAR | Status: AC
  Filled 2018-09-25: qty 30

## 2018-09-25 MED ORDER — SUGAMMADEX SODIUM 200 MG/2ML IV SOLN
INTRAVENOUS | Status: AC
  Filled 2018-09-25: qty 2

## 2018-09-25 MED ORDER — ESTRADIOL 0.1 MG/GM VA CREA
TOPICAL_CREAM | VAGINAL | Status: DC | PRN
  Administered 2018-09-25: 1 via VAGINAL

## 2018-09-25 MED ORDER — KETOROLAC TROMETHAMINE 30 MG/ML IJ SOLN
30.0000 mg | Freq: Four times a day (QID) | INTRAMUSCULAR | Status: DC
  Administered 2018-09-25 – 2018-09-26 (×3): 30 mg via INTRAVENOUS
  Filled 2018-09-25 (×3): qty 1

## 2018-09-25 MED ORDER — DEXAMETHASONE SODIUM PHOSPHATE 10 MG/ML IJ SOLN
INTRAMUSCULAR | Status: DC | PRN
  Administered 2018-09-25: 4 mg via INTRAVENOUS

## 2018-09-25 MED ORDER — ONDANSETRON HCL 4 MG/2ML IJ SOLN
INTRAMUSCULAR | Status: AC
  Filled 2018-09-25: qty 2

## 2018-09-25 MED ORDER — KETOROLAC TROMETHAMINE 30 MG/ML IJ SOLN
INTRAMUSCULAR | Status: AC
  Filled 2018-09-25: qty 1

## 2018-09-25 MED ORDER — MEPERIDINE HCL 50 MG/ML IJ SOLN: 6.2500 mg | INTRAMUSCULAR | Status: DC | PRN

## 2018-09-25 MED ORDER — ACETAMINOPHEN 10 MG/ML IV SOLN
1000.0000 mg | Freq: Once | INTRAVENOUS | Status: DC | PRN
  Administered 2018-09-25: 1000 mg via INTRAVENOUS

## 2018-09-25 MED ORDER — LIDOCAINE 2% (20 MG/ML) 5 ML SYRINGE
INTRAMUSCULAR | Status: AC
  Filled 2018-09-25: qty 5

## 2018-09-25 SURGICAL SUPPLY — 40 items
BLADE HEX COATED 2.75 (ELECTRODE) ×5 IMPLANT
BNDG GAUZE ELAST 4 BULKY (GAUZE/BANDAGES/DRESSINGS) ×5 IMPLANT
CANISTER SUCT 3000ML PPV (MISCELLANEOUS) IMPLANT
COVER WAND RF STERILE (DRAPES) IMPLANT
DRAPE SHEET LG 3/4 BI-LAMINATE (DRAPES) ×5 IMPLANT
GAUZE 4X4 16PLY RFD (DISPOSABLE) ×10 IMPLANT
GAUZE PACKING 2X5 YD STRL (GAUZE/BANDAGES/DRESSINGS) IMPLANT
GLOVE BIO SURGEON STRL SZ 6.5 (GLOVE) ×4 IMPLANT
GLOVE BIO SURGEONS STRL SZ 6.5 (GLOVE) ×1
GLOVE BIOGEL PI IND STRL 6.5 (GLOVE) ×3 IMPLANT
GLOVE BIOGEL PI IND STRL 7.0 (GLOVE) ×3 IMPLANT
GLOVE BIOGEL PI INDICATOR 6.5 (GLOVE) ×2
GLOVE BIOGEL PI INDICATOR 7.0 (GLOVE) ×2
GOWN STRL REUS W/ TWL LRG LVL3 (GOWN DISPOSABLE) ×3 IMPLANT
GOWN STRL REUS W/ TWL XL LVL3 (GOWN DISPOSABLE) ×9 IMPLANT
GOWN STRL REUS W/TWL LRG LVL3 (GOWN DISPOSABLE) ×2
GOWN STRL REUS W/TWL XL LVL3 (GOWN DISPOSABLE) ×6
LIGASURE IMPACT 36 18CM CVD LR (INSTRUMENTS) ×5 IMPLANT
MANIFOLD NEPTUNE II (INSTRUMENTS) ×5 IMPLANT
PACK VAGINAL WOMENS (CUSTOM PROCEDURE TRAY) ×5 IMPLANT
PAD OB MATERNITY 4.3X12.25 (PERSONAL CARE ITEMS) ×5 IMPLANT
SET CYSTO W/LG BORE CLAMP LF (SET/KITS/TRAYS/PACK) ×5 IMPLANT
SPONGE LAP 4X18 RFD (DISPOSABLE) ×5 IMPLANT
SUT MON AB 2-0 CT1 36 (SUTURE) IMPLANT
SUT VIC AB 0 CT1 18XCR BRD8 (SUTURE) ×6 IMPLANT
SUT VIC AB 0 CT1 36 (SUTURE) ×10 IMPLANT
SUT VIC AB 0 CT1 8-18 (SUTURE) ×4
SUT VIC AB 2-0 CT1 27 (SUTURE)
SUT VIC AB 2-0 CT1 TAPERPNT 27 (SUTURE) IMPLANT
SUT VIC AB 2-0 SH 27 (SUTURE)
SUT VIC AB 2-0 SH 27XBRD (SUTURE) IMPLANT
SUT VIC AB 3-0 SH 27 (SUTURE) ×2
SUT VIC AB 3-0 SH 27XBRD (SUTURE) ×3 IMPLANT
SUT VICRYL 0 TIES 12 18 (SUTURE) ×5 IMPLANT
SYR BULB IRRIGATION 50ML (SYRINGE) IMPLANT
TOWEL OR 17X24 6PK STRL BLUE (TOWEL DISPOSABLE) ×10 IMPLANT
TRAY FOLEY MTR SLVR 14FR STAT (SET/KITS/TRAYS/PACK) ×5 IMPLANT
TRAY FOLEY W/BAG SLVR 14FR (SET/KITS/TRAYS/PACK) ×5 IMPLANT
TUBING CONNECTING 10 (TUBING) ×4 IMPLANT
TUBING CONNECTING 10' (TUBING) ×1

## 2018-09-25 NOTE — Brief Op Note (Signed)
09/25/2018  10:12 AM  PATIENT:  Norma [name]  28 y.o. female  PRE-OPERATIVE DIAGNOSIS:  PRESSURE, BLEEDING WITH IUD  POST-OPERATIVE DIAGNOSIS:  PRESSURE, BLEEDING WITH IUD  PROCEDURE:  Procedure(s): VAGINAL HYSTERECTOMY, BILATERAL FIMBRECTOMY (Bilateral) ANTERIOR AND POSTERIOR REPAIR, MC CALLS (N/A) CYSTOSCOPY (N/A)  SURGEON:  Surgeon(s) and Role:    * Symphoni Helbling, Madelaine Etienne, MD - Primary  PHYSICIAN ASSISTANT:   ASSISTANTS:Dr. Thressa Sheller  ANESTHESIA:   local and general  EBL:  550 mL   BLOOD ADMINISTERED:none  DRAINS: Urinary Catheter (Foley) and vaginal packing   LOCAL MEDICATIONS USED:  LIDOCAINE  and Amount: 20 ml  SPECIMEN:  Source of Specimen:  uterus with cervix and bilateral portion of fallopian tubes  DISPOSITION OF SPECIMEN:  PATHOLOGY  COUNTS:  YES  TOURNIQUET:  * No tourniquets in log *  DICTATION: .Note written in EPIC  PLAN OF CARE: Admit for overnight observation  PATIENT DISPOSITION:  PACU - hemodynamically stable.   Delay start of Pharmacological VTE agent (>24hrs) due to surgical blood loss or risk of bleeding: not applicable

## 2018-09-25 NOTE — Transfer of Care (Signed)
Immediate Anesthesia Transfer of Care Note  Patient: Norma [name]  Procedure(s) Performed: VAGINAL HYSTERECTOMY, BILATERAL FIMBRECTOMY (Bilateral Uterus) ANTERIOR AND POSTERIOR REPAIR, MC CALLS (N/A Uterus) CYSTOSCOPY (N/A Bladder)  Patient Location: PACU  Anesthesia Type:General  Level of Consciousness: awake, alert  and oriented  Airway & Oxygen Therapy: Patient Spontanous Breathing and Patient connected to face mask oxygen  Post-op Assessment: Report given to RN and Post -op Vital signs reviewed and stable  Post vital signs: Reviewed and stable  Last Vitals:  Vitals Value Taken Time  BP 121/54 09/25/2018  9:52 AM  Temp    Pulse 85 09/25/2018  9:58 AM  Resp 14 09/25/2018  9:58 AM  SpO2 95 % 09/25/2018  9:58 AM  Vitals shown include unvalidated device data.  Last Pain:  Vitals:   09/25/18 0602  TempSrc: Oral      Patients Stated Pain Goal: 4 (09/25/18 0553)  Complications: No apparent anesthesia complications

## 2018-09-25 NOTE — Op Note (Addendum)
PREOPERATIVE DIAGNOSES: 1. AUB 2. Pelvic pressure 3. Anterior and posterior prolapse  POSTOPERATIVE DIAGNOSES: SAME  PROCEDURE PERFORMED: Total vaginal hysterectomy with bilateral fibriectomy, anterior and posterior repair, modified McCall's and cystoscopy  SURGEON: Dr. Belva Agee ASSISTANT: Dr. Thressa Sheller  ANESTHESIA: General   ESTIMATED BLOOD LOSS: 500cc.  URINE OUTPUT: 200 cc of clear urine at the end of the procedure.  FLUIDS: See anesthesia record  COMPLICATIONS: None   TUBES: None.  DRAINS: None  PATHOLOGY: Uterus w/cervix, bilateral fibria were sent to pathology for review.  FINDINGS: On exam, under anesthesia, normal appearing vulva and vagina, 8 week sized uterus  Operative findings demonstrated a normal sized uterus with bilateral fibrieated portions of fallopian tubes.  Procedure: The patient was prepared and draped in the usual sterile manner for an abdominoperineal procedure. A weighted speculum was placed in the posterior vaginal vault. The cervix was grasped with a teneculum on both its anterior and posterior lips. 20cc of pitressin was injected. With downward traction, we made a circumferential incision of the vaginal mucosa. This allowed dissection and entrance into the posterior cul-de-sac. The posterior peritoneum was sutured to the posterior vaginal cuff. At this time we visualized the uterosacral ligaments. These were clamped and ligated with #0 Vicryl suture. The cervicovesical space was then created by both blunt and sharp dissection, allowing [place] to see the cardinal ligaments. These were clamped and ligated with ligasure Once in the cervicovesical space, we were then able to completely ligate the uterosacral-cardinal ligament complex. The anterior cul-de-sac was then entered sharply. Omentum and intestines were then visualized through the anterior cul-de-sac window and we began removal of the uterine body. The uterine arteries were then clamped and ligated  and carried upward toward the uterus until complete removal was obtained. All major pedicles were clamped with ligasure, cauterized, and ligated. The tubes and ovaries were visualized on either side. The tubes were clamped and doubly ligated with #0 vicryl suture. Bilateral fibriated ends removed easily with ligasure. We then removed the weighted duckbill speculum and placed a regular speculum in the vaginal vault and visualized the entire area. A dilute epi/lidocaine solution was infiltrated under the anterior vaginal mucosa midline. A small incision was made in the vaginal mucosa at the vaginal vault and the Metzenbaum scissors were then used to dissect the mucosa off of the cystocele and cut the vaginal mucosa in the midline. The cut edges were held and splayed laterally with a series of Allis clamps. The bladder was dissected away along the lateral edges with a combination of sharp and blunt dissection, exposing the vesicovaginal space. A series of #2-0 Vicryl interrupted sutures were then placed sequentially along the lateral folds of the vesicovaginal space and brought together to tuck the bladder back while simultaneously bringing the lateral vaginal tissues together. The excess vaginal mucosa was then trimmed.  We inspected for hemostasis, and this was secured. A modified McCall's culdoplasty was performed by placing a stitch through the uterosacral ligament on the left, purstringing the posterior peritoneum and then placing a stitch through the right uterosacral ligament and tying it all together. Hemostasis was again noted.  The vagina was then closed with a running locked #0 Vicryl suture.   Alice clamps were used to tent up the rectocele. A dilute epi/lidocaine solution was infiltrated under the posterior vaginal mucosa midline. A transverse incision was cut. The posterior vaginal wall was opened vertically and midline up to the apex of the rectocele. The cut edges were held and splayed  laterally  with a series of Allis clamps. The open vaginal mucosa was then dissected laterally with a combination of sharp and blunt dissection, exposing the perirectal fascia. The perirectal fascia was then reapproximated with interrupted #2-0 Vicryl sutures to draw the lateral folds together and tuck the rectocele back. Deep interrupted sutures of #0 Vicryl were used to reapproximate the fibers of the levator ani muscles. The excess vaginal mucosa was trimmed. The posterior vaginal wall was closed with a running vicryl to the hymenal tags. Rectal exams were repeated throughout the repair and at the end to ensure there were no sutures penetrating rectal tissue.   Packing and foley will be removed on POD#0 or POD#1, depending on patient's comfort.    Rosie Fate MD

## 2018-09-25 NOTE — Progress Notes (Signed)
Day of Surgery Procedure(s) (LRB): VAGINAL HYSTERECTOMY, BILATERAL FIMBRECTOMY (Bilateral) ANTERIOR AND POSTERIOR REPAIR, MC CALLS (N/A) CYSTOSCOPY (N/A)  Subjective: Patient reports tolerating PO.    Objective: I have reviewed patient's vital signs, intake and output and medications.  General: alert, cooperative and appears stated age Resp: NWOB Cardio: regular rate and rhythm, S1, S2 normal, no murmur, click, rub or gallop GI: soft, non-tender; bowel sounds normal; no masses,  no organomegaly Extremities: extremities normal, atraumatic, no cyanosis or edema Vaginal Bleeding: minimal and packing in  Assessment: s/p Procedure(s): VAGINAL HYSTERECTOMY, BILATERAL FIMBRECTOMY (Bilateral) ANTERIOR AND POSTERIOR REPAIR, MC CALLS (N/A) CYSTOSCOPY (N/A): stable, progressing well and tolerating diet  Plan: Advance diet Encourage ambulation Remove packing and foley in AM  LOS: 0 days    Ranae Pila 09/25/2018, 2:55 PM

## 2018-09-25 NOTE — Anesthesia Procedure Notes (Signed)
Procedure Name: Intubation Date/Time: 09/25/2018 7:41 AM Performed by: Jonna Munro, CRNA Pre-anesthesia Checklist: Patient identified, Emergency Drugs available, Suction available, Patient being monitored and Timeout performed Patient Re-evaluated:Patient Re-evaluated prior to induction Oxygen Delivery Method: Circle system utilized Preoxygenation: Pre-oxygenation with 100% oxygen Induction Type: IV induction Ventilation: Mask ventilation without difficulty Laryngoscope Size: Mac and 3 Grade View: Grade I Tube type: Oral Tube size: 7.0 mm Number of attempts: 1 Airway Equipment and Method: Stylet Secured at: 22 cm Tube secured with: Tape Dental Injury: Teeth and Oropharynx as per pre-operative assessment

## 2018-09-25 NOTE — Anesthesia Postprocedure Evaluation (Signed)
Anesthesia Post Note  Patient: Norma [name]  Procedure(s) Performed: VAGINAL HYSTERECTOMY, BILATERAL FIMBRECTOMY (Bilateral Uterus) ANTERIOR AND POSTERIOR REPAIR, MC CALLS (N/A Uterus) CYSTOSCOPY (N/A Bladder)     Patient location during evaluation: PACU Anesthesia Type: General Level of consciousness: awake and alert Pain management: pain level controlled Vital Signs Assessment: post-procedure vital signs reviewed and stable Respiratory status: spontaneous breathing, nonlabored ventilation, respiratory function stable and patient connected to nasal cannula oxygen Cardiovascular status: blood pressure returned to baseline and stable Postop Assessment: no apparent nausea or vomiting Anesthetic complications: no    Last Vitals:  Vitals:   09/25/18 1200 09/25/18 1224  BP: 117/72 112/64  Pulse: 82 76  Resp: 14 16  Temp: 36.8 C 37.1 C  SpO2: 95% 98%    Last Pain:  Vitals:   09/25/18 1224  TempSrc: Oral  PainSc:                  Trevor Iha

## 2018-09-26 ENCOUNTER — Encounter (HOSPITAL_COMMUNITY): Payer: Self-pay | Admitting: Obstetrics and Gynecology

## 2018-09-26 DIAGNOSIS — N8110 Cystocele, unspecified: Secondary | ICD-10-CM | POA: Diagnosis not present

## 2018-09-26 DIAGNOSIS — J45990 Exercise induced bronchospasm: Secondary | ICD-10-CM | POA: Diagnosis not present

## 2018-09-26 DIAGNOSIS — N939 Abnormal uterine and vaginal bleeding, unspecified: Secondary | ICD-10-CM | POA: Diagnosis not present

## 2018-09-26 DIAGNOSIS — Z975 Presence of (intrauterine) contraceptive device: Secondary | ICD-10-CM | POA: Diagnosis not present

## 2018-09-26 DIAGNOSIS — N72 Inflammatory disease of cervix uteri: Secondary | ICD-10-CM | POA: Diagnosis not present

## 2018-09-26 DIAGNOSIS — N816 Rectocele: Secondary | ICD-10-CM | POA: Diagnosis not present

## 2018-09-26 DIAGNOSIS — R102 Pelvic and perineal pain: Secondary | ICD-10-CM | POA: Diagnosis not present

## 2018-09-26 LAB — CBC
HCT: 29.2 % — ABNORMAL LOW (ref 36.0–46.0)
Hemoglobin: 9.4 g/dL — ABNORMAL LOW (ref 12.0–15.0)
MCH: 32 pg (ref 26.0–34.0)
MCHC: 32.2 g/dL (ref 30.0–36.0)
MCV: 99.3 fL (ref 80.0–100.0)
Platelets: 153 10*3/uL (ref 150–400)
RBC: 2.94 MIL/uL — ABNORMAL LOW (ref 3.87–5.11)
RDW: 12.5 % (ref 11.5–15.5)
WBC: 7.5 10*3/uL (ref 4.0–10.5)
nRBC: 0 % (ref 0.0–0.2)

## 2018-09-26 MED ORDER — DOCUSATE SODIUM 100 MG PO CAPS: 100.0000 mg | ORAL_CAPSULE | Freq: Two times a day (BID) | ORAL | 2 refills | Status: DC

## 2018-09-26 MED ORDER — IBUPROFEN 600 MG PO TABS: 600.0000 mg | ORAL_TABLET | Freq: Four times a day (QID) | ORAL | 0 refills | Status: DC | PRN

## 2018-09-26 MED ORDER — OXYCODONE HCL 5 MG PO TABS: 5.0000 mg | ORAL_TABLET | ORAL | 0 refills | Status: DC | PRN

## 2018-09-26 MED ORDER — ACETAMINOPHEN 500 MG PO TABS: 1000.0000 mg | ORAL_TABLET | Freq: Four times a day (QID) | ORAL | 0 refills | Status: DC

## 2018-09-26 NOTE — Discharge Summary (Signed)
Physician Discharge Summary  Patient ID: Norma SessionsHaley L Denes MRN: [id number] DOB/AGE: 29/12/1990 28 y.o.  Admit date: 09/25/2018 Discharge date: 09/26/2018  Admission Diagnoses:  Discharge Diagnoses:  Active Problems:   Pelvic prolapse   S/P hysterectomy   Discharged Condition: good  Hospital Course: Pt is a 28 yo admitted for scheduled above surgery. She Had an uncomplicated TVH, bilateral fibrectomy, anterior and posterior repair, modified mccalls, and cystoscopy with an EBL of 500cc. She had an uncomplicated postoperative course and on POD#1 was meeting all goals such as ambulating, passing flatus, voiding freely, and tolerating a general diet and she was deemed stable for discharge home.    Consults: None  Significant Diagnostic Studies: labs:  CBC    Component Value Date/Time   WBC 7.5 09/26/2018 0537   RBC 2.94 (L) 09/26/2018 0537   HGB 9.4 (L) 09/26/2018 0537   HCT 29.2 (L) 09/26/2018 0537   PLT 153 09/26/2018 0537   MCV 99.3 09/26/2018 0537   MCH 32.0 09/26/2018 0537   MCHC 32.2 09/26/2018 0537   RDW 12.5 09/26/2018 0537   LYMPHSABS 2.1 03/18/2014 1218   MONOABS 0.3 03/18/2014 1218   EOSABS 0.2 03/18/2014 1218   BASOSABS 0.0 03/18/2014 1218     Treatments: surgery: see hospital course  Discharge Exam: Blood pressure 111/69, pulse 65, temperature 98.5 F (36.9 C), temperature source Oral, resp. rate 16, height 5\' 6"  (1.676 m), weight 78.3 kg, last menstrual period 09/02/2018, SpO2 100 %, currently breastfeeding. General appearance: alert, cooperative and appears stated age Resp: clear to auscultation bilaterally Cardio: regular rate and rhythm, S1, S2 normal, no murmur, click, rub or gallop GI: soft, non-tender; bowel sounds normal; no masses,  no organomegaly Pelvic: vagina normal without discharge and vaginal packing was removed by RN - NO blood on pad Extremities: extremities normal, atraumatic, no cyanosis or edema  Disposition: Discharge disposition: 01-Home  or Self Care       Discharge Instructions    Call MD for:   Complete by:  As directed    Heavy vaginal bleeding or abnormal vaginal discharge   Call MD for:  difficulty breathing, headache or visual disturbances   Complete by:  As directed    Call MD for:  persistant nausea and vomiting   Complete by:  As directed    Call MD for:  redness, tenderness, or signs of infection (pain, swelling, redness, odor or green/yellow discharge around incision site)   Complete by:  As directed    Call MD for:  severe uncontrolled pain   Complete by:  As directed    Call MD for:  temperature >100.4   Complete by:  As directed    Diet general   Complete by:  As directed    Driving Restrictions   Complete by:  As directed    Do not drive until you are off narcotic pain medications and you feel like you can react in an emergency.   Increase activity slowly   Complete by:  As directed    Lifting restrictions   Complete by:  As directed    Don't lift anything more than 15-20 pounds   Sexual Activity Restrictions   Complete by:  As directed    Nothing in the vagina x 2 to 6 weeks. We will discuss at clinic visit.     Allergies as of 09/26/2018   No Known Allergies     Medication List    STOP taking these medications   levonorgestrel 20 MCG/24HR IUD  Commonly known as:  MIRENA     TAKE these medications   acetaminophen 500 MG tablet Commonly known as:  TYLENOL Take 2 tablets (1,000 mg total) by mouth every 6 (six) hours.   albuterol 108 (90 Base) MCG/ACT inhaler Commonly known as:  PROVENTIL HFA;VENTOLIN HFA Inhale 2 puffs into the lungs every 6 (six) hours as needed for wheezing or shortness of breath.   docusate sodium 100 MG capsule Commonly known as:  COLACE Take 1 capsule (100 mg total) by mouth 2 (two) times daily.   ferrous sulfate 325 (65 FE) MG tablet Take 1 tablet (325 mg total) by mouth 2 (two) times daily with a meal.   ibuprofen 600 MG tablet Commonly known as:   ADVIL,MOTRIN Take 1 tablet (600 mg total) by mouth every 6 (six) hours as needed. What changed:    medication strength  how much to take  reasons to take this   oxyCODONE 5 MG immediate release tablet Commonly known as:  Oxy IR/ROXICODONE Take 1-2 tablets (5-10 mg total) by mouth every 4 (four) hours as needed for moderate pain.   prenatal multivitamin Tabs tablet Take 1 tablet by mouth every other day.   sertraline 25 MG tablet Commonly known as:  ZOLOFT Take 25 mg by mouth daily.        Signed: Ranae Pila 09/26/2018, 9:24 AM

## 2018-09-26 NOTE — Progress Notes (Signed)
Received order to remove foley cath and vaginal packing from Dr [name],[name]. Removed foley cath and vaginal packing per MD order. Pt tolerated procedure well.  Encourage pt to ambulate. Will continue to monitor.

## 2018-09-26 NOTE — Progress Notes (Signed)
Discharge and medication instructions reviewed with patient and spouse. Questions answered and both deny further questions. No prescriptions given to patient. Spouse is driving patient home. Jax Abdelrahman, RN 

## 2018-09-28 ENCOUNTER — Other Ambulatory Visit (HOSPITAL_COMMUNITY): Payer: Self-pay | Admitting: Obstetrics and Gynecology

## 2018-09-28 ENCOUNTER — Ambulatory Visit (HOSPITAL_COMMUNITY)
Admission: RE | Admit: 2018-09-28 | Discharge: 2018-09-28 | Disposition: A | Discharge status: Home / Self Care | Payer: BLUE CROSS/BLUE SHIELD | Source: Ambulatory Visit | Attending: Obstetrics and Gynecology | Admitting: Obstetrics and Gynecology

## 2018-09-28 ENCOUNTER — Other Ambulatory Visit: Payer: Self-pay | Admitting: Obstetrics and Gynecology

## 2018-09-28 DIAGNOSIS — N200 Calculus of kidney: Secondary | ICD-10-CM | POA: Diagnosis not present

## 2018-09-28 DIAGNOSIS — Z0189 Encounter for other specified special examinations: Secondary | ICD-10-CM | POA: Diagnosis not present

## 2018-09-28 DIAGNOSIS — Z09 Encounter for follow-up examination after completed treatment for conditions other than malignant neoplasm: Secondary | ICD-10-CM | POA: Diagnosis not present

## 2018-09-28 DIAGNOSIS — R339 Retention of urine, unspecified: Secondary | ICD-10-CM

## 2018-09-28 DIAGNOSIS — R109 Unspecified abdominal pain: Secondary | ICD-10-CM

## 2018-09-28 MED ORDER — SODIUM CHLORIDE (PF) 0.9 % IJ SOLN
INTRAMUSCULAR | Status: AC
  Filled 2018-09-28: qty 300

## 2018-09-28 MED ORDER — IOHEXOL 300 MG/ML  SOLN
150.0000 mL | Freq: Once | INTRAMUSCULAR | Status: AC | PRN
  Administered 2018-09-28: 150 mL via INTRAVENOUS

## 2018-10-05 DIAGNOSIS — R399 Unspecified symptoms and signs involving the genitourinary system: Secondary | ICD-10-CM | POA: Diagnosis not present

## 2018-10-16 DIAGNOSIS — F411 Generalized anxiety disorder: Secondary | ICD-10-CM | POA: Diagnosis not present

## 2018-10-17 DIAGNOSIS — F901 Attention-deficit hyperactivity disorder, predominantly hyperactive type: Secondary | ICD-10-CM | POA: Diagnosis not present

## 2018-10-18 DIAGNOSIS — F902 Attention-deficit hyperactivity disorder, combined type: Secondary | ICD-10-CM | POA: Diagnosis not present

## 2018-10-28 ENCOUNTER — Inpatient Hospital Stay (HOSPITAL_COMMUNITY): Payer: BLUE CROSS/BLUE SHIELD

## 2018-10-28 ENCOUNTER — Encounter (HOSPITAL_COMMUNITY): Payer: Self-pay | Admitting: *Deleted

## 2018-10-28 ENCOUNTER — Inpatient Hospital Stay (HOSPITAL_COMMUNITY)
Admission: AD | Admit: 2018-10-28 | Discharge: 2018-10-28 | Disposition: A | Discharge status: Home / Self Care | Payer: BLUE CROSS/BLUE SHIELD | Attending: Obstetrics and Gynecology | Admitting: Obstetrics and Gynecology

## 2018-10-28 DIAGNOSIS — B9689 Other specified bacterial agents as the cause of diseases classified elsewhere: Secondary | ICD-10-CM | POA: Insufficient documentation

## 2018-10-28 DIAGNOSIS — N83201 Unspecified ovarian cyst, right side: Secondary | ICD-10-CM | POA: Diagnosis not present

## 2018-10-28 DIAGNOSIS — R102 Pelvic and perineal pain: Secondary | ICD-10-CM | POA: Diagnosis not present

## 2018-10-28 DIAGNOSIS — N760 Acute vaginitis: Secondary | ICD-10-CM | POA: Insufficient documentation

## 2018-10-28 DIAGNOSIS — N83291 Other ovarian cyst, right side: Secondary | ICD-10-CM | POA: Diagnosis not present

## 2018-10-28 DIAGNOSIS — Z90710 Acquired absence of both cervix and uterus: Secondary | ICD-10-CM | POA: Diagnosis not present

## 2018-10-28 DIAGNOSIS — R1031 Right lower quadrant pain: Secondary | ICD-10-CM | POA: Diagnosis not present

## 2018-10-28 DIAGNOSIS — R109 Unspecified abdominal pain: Secondary | ICD-10-CM

## 2018-10-28 LAB — URINALYSIS, MICROSCOPIC (REFLEX)

## 2018-10-28 LAB — CBC
HCT: 32.2 % — ABNORMAL LOW (ref 36.0–46.0)
Hemoglobin: 10.6 g/dL — ABNORMAL LOW (ref 12.0–15.0)
MCH: 31.1 pg (ref 26.0–34.0)
MCHC: 32.9 g/dL (ref 30.0–36.0)
MCV: 94.4 fL (ref 80.0–100.0)
Platelets: 203 10*3/uL (ref 150–400)
RBC: 3.41 MIL/uL — ABNORMAL LOW (ref 3.87–5.11)
RDW: 12.4 % (ref 11.5–15.5)
WBC: 6.6 10*3/uL (ref 4.0–10.5)
nRBC: 0 % (ref 0.0–0.2)

## 2018-10-28 LAB — URINALYSIS, ROUTINE W REFLEX MICROSCOPIC
BILIRUBIN URINE: NEGATIVE
Glucose, UA: NEGATIVE mg/dL
Ketones, ur: 15 mg/dL — AB
Nitrite: NEGATIVE
Protein, ur: NEGATIVE mg/dL
Specific Gravity, Urine: >1.03 — ABNORMAL HIGH (ref 1.005–1.030)
pH: 5.5 (ref 5.0–8.0)

## 2018-10-28 LAB — WET PREP, GENITAL
Sperm: NONE SEEN
Trich, Wet Prep: NONE SEEN
Yeast Wet Prep HPF POC: NONE SEEN

## 2018-10-28 MED ORDER — KETOROLAC TROMETHAMINE 30 MG/ML IJ SOLN: 30.0000 mg | Freq: Once | INTRAMUSCULAR | Status: DC

## 2018-10-28 MED ORDER — TINIDAZOLE 500 MG PO TABS: 2.0000 g | ORAL_TABLET | Freq: Every day | ORAL | 0 refills | Status: DC

## 2018-10-28 NOTE — Progress Notes (Signed)
Veronica Rogers CNM in earlier to discuss test results and d/c plan. Written and verbal d/c instructions given and understanding voiced. 

## 2018-10-28 NOTE — MAU Note (Addendum)
Had hysterectomy 09/25/18. I have been spotting since then. Also had bladder work due to pelvic floor prolapse at time of surgery. Have been having a lot of bladder spasms. Saw Dr Henderson Cloud few wks ago and checked urine. WAs taking antibiotics for mastitis and he said that antibiotic would treat uti too. Tonight had some bright blood-about panty liner amt. Some low back pain. Having some vaginal d/c that has different odor that has been there few wks but not going away. Have been doing more this wk -have 28yo,28yo, and 57month old. Have not had regular BMS since surgery. Last BM 3 days ago.

## 2018-10-28 NOTE — MAU Provider Note (Signed)
History     CSN: [id number]  Arrival date and time: 10/28/18 0003   First Provider Initiated Contact with Patient 10/28/18 0037      Chief Complaint  Patient presents with  . Abdominal Pain  . Back Pain   Norma [name] is a 28 y.o. G3P3 not currently pregnant who presents to MAU with complaints of back pain and pelvic pain. She is post hysterectomy and pelvic floor repair done on 09/25/2018. She reports pelvic pan and back pain started occurring 2 days ago, reports after she urinates her bladder "cramps up". She completed antibiotics for mastitis and bladder infection 1-2 weeks ago. Describes back pain as aching in her lower back that radiates up to her right flank, rates pain 5/10- has not taken any medication for pain. Since completion of antibiotics reports increased vaginal discharge with foul odor and last night when in restroom reports having a gush of bright red vaginal bleeding enough to soak part of a panty liner, denies having to wear pads or change pads prior to coming to MAU. Currently taking Colace daily, last BM 3 days ago. Has post op appointment scheduled in the office for 11/02/18.    OB History    Gravida  3   Para  3   Term  3   Preterm      AB      Living  3     SAB      TAB      Ectopic      Multiple  0   Live Births  3           Past Medical History:  Diagnosis Date  . Anemia   . Asthma    exercise induced  . Dysmenorrhea   . Right ureteral stone     Past Surgical History:  Procedure Laterality Date  . ABDOMINAL HYSTERECTOMY    . ADENOIDECTOMY    . ANTERIOR AND POSTERIOR REPAIR WITH SACROSPINOUS FIXATION N/A 09/25/2018   Procedure: ANTERIOR AND POSTERIOR REPAIR, MC CALLS;  Surgeon: Ranae Pila, MD;  Location: [facility];  Service: Gynecology;  Laterality: N/A;  . CYSTOSCOPY N/A 09/25/2018   Procedure: CYSTOSCOPY;  Surgeon: Ranae Pila, MD;  Location: [facility];  Service: Gynecology;  Laterality: N/A;  . DILATION AND  CURETTAGE OF UTERUS N/A 03/30/2018   Procedure: DILATATION AND EVACUATION;  Surgeon: Richarda Overlie, MD;  Location: [facility];  Service: Gynecology;  Laterality: N/A;  . TONSILLECTOMY    . VAGINAL HYSTERECTOMY Bilateral 09/25/2018   Procedure: VAGINAL HYSTERECTOMY, BILATERAL FIMBRECTOMY;  Surgeon: Ranae Pila, MD;  Location: [facility];  Service: Gynecology;  Laterality: Bilateral;  . WISDOM TOOTH EXTRACTION      History reviewed. No pertinent family history.  Social History   Tobacco Use  . Smoking status: Never Smoker  . Smokeless tobacco: Never Used  Substance Use Topics  . Alcohol use: Yes    Comment: occasional when not pregnant  . Drug use: No    Allergies: No Known Allergies  Medications Prior to Admission  Medication Sig Dispense Refill Last Dose  . acetaminophen (TYLENOL) 500 MG tablet Take 2 tablets (1,000 mg total) by mouth every 6 (six) hours. 30 tablet 0 Past Week at Unknown time  . albuterol (PROVENTIL HFA;VENTOLIN HFA) 108 (90 Base) MCG/ACT inhaler Inhale 2 puffs into the lungs every 6 (six) hours as needed for wheezing or shortness of breath.   Past Week at Unknown time  . docusate sodium (COLACE)  100 MG capsule Take 1 capsule (100 mg total) by mouth 2 (two) times daily. 60 capsule 2 Past Week at Unknown time  . ibuprofen (ADVIL,MOTRIN) 600 MG tablet Take 1 tablet (600 mg total) by mouth every 6 (six) hours as needed. 30 tablet 0 10/27/2018 at 2230  . lisdexamfetamine (VYVANSE) 30 MG capsule Take 30 mg by mouth daily.   Past Week at Unknown time  . oxyCODONE (OXY IR/ROXICODONE) 5 MG immediate release tablet Take 1-2 tablets (5-10 mg total) by mouth every 4 (four) hours as needed for moderate pain. 30 tablet 0 Past Month at Unknown time  . Prenatal Vit-Fe Fumarate-FA (PRENATAL MULTIVITAMIN) TABS tablet Take 1 tablet by mouth every other day.    Past Month at Unknown time  . sertraline (ZOLOFT) 25 MG tablet Take 75 mg by mouth daily.    10/27/2018 at Unknown time  .  ferrous sulfate 325 (65 FE) MG tablet Take 1 tablet (325 mg total) by mouth 2 (two) times daily with a meal. (Patient not taking: Reported on 09/11/2018) 60 tablet 0 Not Taking at Unknown time    Review of Systems  Constitutional: Negative.   Respiratory: Negative.   Cardiovascular: Negative.   Gastrointestinal: Positive for constipation. Negative for nausea and vomiting.  Genitourinary: Positive for dysuria, flank pain, pelvic pain, vaginal bleeding and vaginal discharge. Negative for difficulty urinating and frequency.  Musculoskeletal: Positive for back pain.   Physical Exam   Blood pressure 121/71, pulse 96, temperature 98.3 F (36.8 C), resp. rate 18, height 5\' 7"  (1.702 m), weight 79.8 kg, last menstrual period 09/02/2018, not currently breastfeeding.  Physical Exam  Nursing note and vitals reviewed. Constitutional: She appears well-developed and well-nourished. No distress.  Cardiovascular: Normal rate, regular rhythm and normal heart sounds.  Respiratory: Effort normal. No respiratory distress. She has no wheezes. She has no rales.  GI: Soft. She exhibits no distension. There is abdominal tenderness. There is no rebound and no guarding.  Tenderness in right lower quadrant and right flank CVA tenderness  Genitourinary:    Vaginal bleeding present.  There is bleeding in the vagina.    Genitourinary Comments: Pelvic exam: Vaginal cuff intact- sutures present, scant dark reddish brown discharge present with mild odor, vaginal walls and external genitalia normal   Musculoskeletal: Normal range of motion.        General: No edema.  Psychiatric: She has a normal mood and affect. Her behavior is normal. Thought content normal.    MAU Course  Procedures  MDM Orders Placed This Encounter  Procedures  . Wet prep, genital  . Urine Culture  . US RENAL  . Urinalysis, Routine w reflex microscopic  . CBC  . Urinalysis, Microscopic (reflex)   Labs reviewed:  Results for orders  placed or performed during the hospital encounter of 10/28/18 (from the past 24 hour(s))  Urinalysis, Routine w reflex microscopic     Status: Abnormal   Collection Time: 10/28/18 12:36 AM  Result Value Ref Range   Color, Urine YELLOW YELLOW   APPearance CLEAR CLEAR   Specific Gravity, Urine >1.030 (H) 1.005 - 1.030   pH 5.5 5.0 - 8.0   Glucose, UA NEGATIVE NEGATIVE mg/dL   Hgb urine dipstick SMALL (A) NEGATIVE   Bilirubin Urine NEGATIVE NEGATIVE   Ketones, ur 15 (A) NEGATIVE mg/dL   Protein, ur NEGATIVE NEGATIVE mg/dL   Nitrite NEGATIVE NEGATIVE   Leukocytes,Ua TRACE (A) NEGATIVE  Urinalysis, Microscopic (reflex)     Status: Abnormal  Collection Time: 10/28/18 12:36 AM  Result Value Ref Range   RBC / HPF 6-10 0 - 5 RBC/hpf   WBC, UA 0-5 0 - 5 WBC/hpf   Bacteria, UA FEW (A) NONE SEEN   Squamous Epithelial / LPF 0-5 0 - 5  CBC     Status: Abnormal   Collection Time: 10/28/18 12:43 AM  Result Value Ref Range   WBC 6.6 4.0 - 10.5 K/uL   RBC 3.41 (L) 3.87 - 5.11 MIL/uL   Hemoglobin 10.6 (L) 12.0 - 15.0 g/dL   HCT 16.132.2 (L) 09.636.0 - 04.546.0 %   MCV 94.4 80.0 - 100.0 fL   MCH 31.1 26.0 - 34.0 pg   MCHC 32.9 30.0 - 36.0 g/dL   RDW 40.912.4 81.111.5 - 91.415.5 %   Platelets 203 150 - 400 K/uL   nRBC 0.0 0.0 - 0.2 %  Wet prep, genital     Status: Abnormal   Collection Time: 10/28/18 12:49 AM  Result Value Ref Range   Yeast Wet Prep HPF POC NONE SEEN NONE SEEN   Trich, Wet Prep NONE SEEN NONE SEEN   Clue Cells Wet Prep HPF POC PRESENT (A) NONE SEEN   WBC, Wet Prep HPF POC TOO NUMEROUS TO COUNT (A) NONE SEEN   Sperm NONE SEEN    US report reviewed:  [place] Renal  Result Date: 10/28/2018 CLINICAL DATA:  Right lower quadrant abdomen pain for 2 days. EXAM: RENAL / URINARY TRACT ULTRASOUND COMPLETE COMPARISON:  None. FINDINGS: Right Kidney: Renal measurements: 11.9 x 4.4 x 4.9 cm = volume: 132.6 mL . Echogenicity within normal limits. No mass or hydronephrosis visualized. Left Kidney: Renal measurements:  11.9 x 5.8 x 4.9 cm = volume: 177.8 mL. Echogenicity within normal limits. No mass or hydronephrosis visualized. Bladder: Limited evaluation as the bladder is nondistended. There is incidental finding a complex right ovarian cyst measuring 6.1 x 5.1 x 4.8 cm. IMPRESSION: Normal kidneys. Complex right ovarian cyst measuring 6.1 x 5.1 x 4.8 cm. Electronically Signed   By: Sherian ReinWei-Chen  Lin M.D.   On: 10/28/2018 02:05   Discussed finding of US and labs with patient. Educated on right ovarian cyst and treatment of pain with Tylenol or Ibuprofen. Offered Toradol to patient prior to discharge home, patient declines medication at this time. Educated and discussed hydration, increase amount of dark leafy greens and decreasing use of percocet in order to relieve constipation. Offered enema for relief of constipation, patient declines.   Follow up as scheduled for post op appointment next week and return to MAU as needed. Patient stable at time of discharge.   Assessment and Plan   1. Right ovarian cyst   2. Acute right flank pain   3. Bacterial vaginosis   4. Status post hysterectomy    Discharge home Follow up as scheduled  Return to MAU as needed Rx for Tindamax for BV sent to pharmacy of choice  Educated on ovarian cyst   Sharyon CableVeronica C Shakeia Krus CNM 10/28/2018, 2:57 AM

## 2018-10-29 LAB — URINE CULTURE: Culture: NO GROWTH

## 2018-10-30 LAB — GC/CHLAMYDIA PROBE AMP (~~LOC~~) NOT AT ARMC
Chlamydia: NEGATIVE
Neisseria Gonorrhea: NEGATIVE

## 2018-11-01 DIAGNOSIS — F902 Attention-deficit hyperactivity disorder, combined type: Secondary | ICD-10-CM | POA: Diagnosis not present

## 2018-11-07 DIAGNOSIS — R1031 Right lower quadrant pain: Secondary | ICD-10-CM | POA: Diagnosis not present

## 2018-11-08 DIAGNOSIS — F902 Attention-deficit hyperactivity disorder, combined type: Secondary | ICD-10-CM | POA: Diagnosis not present

## 2018-11-08 DIAGNOSIS — F3181 Bipolar II disorder: Secondary | ICD-10-CM | POA: Diagnosis not present

## 2018-11-15 DIAGNOSIS — F900 Attention-deficit hyperactivity disorder, predominantly inattentive type: Secondary | ICD-10-CM | POA: Diagnosis not present

## 2018-11-29 DIAGNOSIS — F900 Attention-deficit hyperactivity disorder, predominantly inattentive type: Secondary | ICD-10-CM | POA: Diagnosis not present

## 2018-12-01 DIAGNOSIS — F901 Attention-deficit hyperactivity disorder, predominantly hyperactive type: Secondary | ICD-10-CM | POA: Diagnosis not present

## 2018-12-01 DIAGNOSIS — F411 Generalized anxiety disorder: Secondary | ICD-10-CM | POA: Diagnosis not present

## 2018-12-01 DIAGNOSIS — F429 Obsessive-compulsive disorder, unspecified: Secondary | ICD-10-CM | POA: Diagnosis not present

## 2018-12-13 DIAGNOSIS — N83201 Unspecified ovarian cyst, right side: Secondary | ICD-10-CM | POA: Diagnosis not present

## 2018-12-13 DIAGNOSIS — F900 Attention-deficit hyperactivity disorder, predominantly inattentive type: Secondary | ICD-10-CM | POA: Diagnosis not present

## 2018-12-19 ENCOUNTER — Ambulatory Visit: Payer: Self-pay | Admitting: Physician Assistant

## 2019-01-10 DIAGNOSIS — F900 Attention-deficit hyperactivity disorder, predominantly inattentive type: Secondary | ICD-10-CM | POA: Diagnosis not present

## 2019-03-15 DIAGNOSIS — F429 Obsessive-compulsive disorder, unspecified: Secondary | ICD-10-CM | POA: Diagnosis not present

## 2019-03-15 DIAGNOSIS — F901 Attention-deficit hyperactivity disorder, predominantly hyperactive type: Secondary | ICD-10-CM | POA: Diagnosis not present

## 2019-03-15 DIAGNOSIS — F411 Generalized anxiety disorder: Secondary | ICD-10-CM | POA: Diagnosis not present

## 2019-04-11 DIAGNOSIS — F900 Attention-deficit hyperactivity disorder, predominantly inattentive type: Secondary | ICD-10-CM | POA: Diagnosis not present

## 2019-04-18 DIAGNOSIS — F900 Attention-deficit hyperactivity disorder, predominantly inattentive type: Secondary | ICD-10-CM | POA: Diagnosis not present

## 2019-04-23 DIAGNOSIS — R309 Painful micturition, unspecified: Secondary | ICD-10-CM | POA: Diagnosis not present

## 2019-04-23 DIAGNOSIS — R319 Hematuria, unspecified: Secondary | ICD-10-CM | POA: Diagnosis not present

## 2019-04-25 DIAGNOSIS — F900 Attention-deficit hyperactivity disorder, predominantly inattentive type: Secondary | ICD-10-CM | POA: Diagnosis not present

## 2019-04-30 DIAGNOSIS — N202 Calculus of kidney with calculus of ureter: Secondary | ICD-10-CM | POA: Diagnosis not present

## 2019-05-07 ENCOUNTER — Ambulatory Visit (INDEPENDENT_AMBULATORY_CARE_PROVIDER_SITE_OTHER): Payer: BC Managed Care – PPO | Admitting: Family Medicine

## 2019-05-07 ENCOUNTER — Encounter: Payer: Self-pay | Admitting: Family Medicine

## 2019-05-07 VITALS — Ht 66.0 in | Wt 171.0 lb

## 2019-05-07 DIAGNOSIS — F900 Attention-deficit hyperactivity disorder, predominantly inattentive type: Secondary | ICD-10-CM | POA: Diagnosis not present

## 2019-05-07 DIAGNOSIS — J45990 Exercise induced bronchospasm: Secondary | ICD-10-CM

## 2019-05-07 DIAGNOSIS — J309 Allergic rhinitis, unspecified: Secondary | ICD-10-CM | POA: Diagnosis not present

## 2019-05-07 DIAGNOSIS — R51 Headache: Secondary | ICD-10-CM | POA: Diagnosis not present

## 2019-05-07 DIAGNOSIS — R519 Headache, unspecified: Secondary | ICD-10-CM

## 2019-05-07 DIAGNOSIS — G43009 Migraine without aura, not intractable, without status migrainosus: Secondary | ICD-10-CM

## 2019-05-07 HISTORY — DX: Allergic rhinitis, unspecified: J30.9

## 2019-05-07 MED ORDER — SUMATRIPTAN SUCCINATE 50 MG PO TABS: 50.0000 mg | ORAL_TABLET | Freq: Once | ORAL | 1 refills | Status: DC

## 2019-05-07 NOTE — Patient Instructions (Signed)
Please return in 6-8 weeks to recheck headaches  It was a pleasure meeting you today! Thank you for choosing [place] to meet your healthcare needs! I truly look forward to working with you. If you have any questions or concerns, please send me a message via Mychart or call the office at [phone].

## 2019-05-07 NOTE — Progress Notes (Signed)
Virtual Visit via Video Note  Subjective  CC:  Chief Complaint  Patient presents with  . Establish Care    Has not had a PCP  . Headache    Has been 3 a week that last about 3 days.. Takes OTC Excedrin.. Was given Percocet by GYN for headaches     I connected with Norma SessionsHaley L Rahe on 05/07/19 at  3:20 PM EDT by a video enabled telemedicine application and verified that I am speaking with the correct person using two identifiers. Location patient: Home Location provider[place] [facility] [address] Persons participating in the virtual visit: Marga HootsHaley L Maciejewski,  L , MD Rita Oharaiara Simmons, CMA I reviewed records via chart from OB/GYN.  I discussed the limitations of evaluation and management by telemedicine and the availability of in person appointments. The patient expressed understanding and agreed to proceed. HPI: Norma [name] is a 28 y.o. female who was contacted today to address the problems listed above in the chief complaint. [birth year]. 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 Reports has strugged with headaches with all 3 of her pregnancies but worse with the last. Her youngest son in now 91 yo and she still has headaches. OB thought they were hormonally related. She describes different headache types: migrainous with photophobia, nausea, phonophobia and needing to lie down with mild relief on excedrin migraine; at times last 3 days. Other headaches are more temporal or occipital and more c/w tension and TMJ/bruxism headaches. No family history of migraines. No neuro sxs. Denies mood problems but does live a hectic life at the moment raising 3 small children < 4 yo.  . ADD managed by [facility]. Doing well on vyvanse. Would prefer PCP mgt. dxd in high school.  Marland Kitchen. EIA: mild.  Assessment  1. Migraine without aura and without status migrainosus, not intractable   2. Chronic allergic rhinitis   3. Headache disorder   4. Attention deficit hyperactivity  disorder (ADHD), predominantly inattentive type   5. Exercise-induced asthma      Plan   headaches:  Multifactorial/combo: migrainous features. Trial of imitrex. Education; headache log. Recheck. No red flags today.  Continue vyvanse for adhd.   cpe in near future.  I discussed the assessment and treatment plan with the patient. The patient was provided an opportunity to ask questions and all were answered. The patient agreed with the plan and demonstrated an understanding of the instructions.   The patient was advised to call back or seek an in-person evaluation if the symptoms worsen or if the condition fails to improve as anticipated. Follow up: Return in about 8 weeks (around 07/02/2019) for recheck.  Visit date not found  Meds ordered this encounter  Medications  . SUMAtriptan (IMITREX) 50 MG tablet    Sig: Take 1 tablet (50 mg total) by mouth once for 1 dose. May repeat in 2 hours if headache persists or recurs.    Dispense:  10 tablet    Refill:  1      I reviewed the patients updated PMH, FH, and SocHx.    Patient Active Problem List   Diagnosis Date Noted  . Chronic allergic rhinitis 05/07/2019  . S/P hysterectomy 09/25/2018  . 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 ADHD (attention deficit hyperactivity disorder) 01/30/2015  . Exercise-induced asthma 01/30/2015  . 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  acetaminophen (TYLENOL) 500 MG tablet Take 2 tablets (1,000 mg total) by mouth every 6 (six) hours.  Marland Kitchen albuterol (PROVENTIL HFA;VENTOLIN HFA) 108 (90 Base) MCG/ACT inhaler Inhale 2 puffs into the lungs every 6 (six) hours as needed for wheezing or shortness of breath.  . lisdexamfetamine (VYVANSE) 30 MG capsule Take 30 mg by mouth daily.  . Prenatal Vit-Fe Fumarate-FA (PRENATAL MULTIVITAMIN) TABS tablet Take 1 tablet by mouth every other day.   . [DISCONTINUED] sertraline (ZOLOFT) 25 MG tablet Take 75  mg by mouth daily.     Allergies: Patient has No Known Allergies. Family History: Patient family history includes Healthy in her daughter. Social History:  Patient  reports that she has never smoked. She has never used smokeless tobacco. She reports current alcohol use. She reports that she does not use drugs.  Review of Systems: Constitutional: Negative for fever malaise or anorexia Cardiovascular: negative for chest pain Respiratory: negative for SOB or persistent cough Gastrointestinal: negative for abdominal pain  OBJECTIVE Vitals: Ht 5\' 6"  (1.676 m)   Wt 171 lb (77.6 kg)   LMP 09/02/2018 (Exact Date)   BMI 27.60 kg/m  General: no acute distress , A&Ox3  Leamon Arnt, MD

## 2019-05-09 NOTE — Addendum Note (Signed)
Addended by: Billey Chang on: 05/09/2019 09:03 AM   Modules accepted: Level of Service

## 2019-05-14 DIAGNOSIS — N201 Calculus of ureter: Secondary | ICD-10-CM | POA: Diagnosis not present

## 2019-05-29 DIAGNOSIS — F411 Generalized anxiety disorder: Secondary | ICD-10-CM | POA: Diagnosis not present

## 2019-05-29 DIAGNOSIS — F901 Attention-deficit hyperactivity disorder, predominantly hyperactive type: Secondary | ICD-10-CM | POA: Diagnosis not present

## 2019-05-29 DIAGNOSIS — F429 Obsessive-compulsive disorder, unspecified: Secondary | ICD-10-CM | POA: Diagnosis not present

## 2019-06-04 ENCOUNTER — Other Ambulatory Visit: Payer: Self-pay

## 2019-06-04 ENCOUNTER — Encounter: Payer: Self-pay | Admitting: Family Medicine

## 2019-06-04 ENCOUNTER — Ambulatory Visit (INDEPENDENT_AMBULATORY_CARE_PROVIDER_SITE_OTHER): Payer: BC Managed Care – PPO | Admitting: Family Medicine

## 2019-06-04 VITALS — BP 124/82 | HR 84 | Temp 97.8°F | Resp 16 | Ht 66.0 in | Wt 172.0 lb

## 2019-06-04 DIAGNOSIS — Z23 Encounter for immunization: Secondary | ICD-10-CM | POA: Diagnosis not present

## 2019-06-04 DIAGNOSIS — G44211 Episodic tension-type headache, intractable: Secondary | ICD-10-CM

## 2019-06-04 DIAGNOSIS — G44031 Episodic paroxysmal hemicrania, intractable: Secondary | ICD-10-CM

## 2019-06-04 DIAGNOSIS — F458 Other somatoform disorders: Secondary | ICD-10-CM | POA: Diagnosis not present

## 2019-06-04 DIAGNOSIS — G43519 Persistent migraine aura without cerebral infarction, intractable, without status migrainosus: Secondary | ICD-10-CM

## 2019-06-04 MED ORDER — RIZATRIPTAN BENZOATE 10 MG PO TBDP: 10.0000 mg | ORAL_TABLET | ORAL | 0 refills | Status: DC | PRN

## 2019-06-04 MED ORDER — KETOROLAC TROMETHAMINE 60 MG/2ML IM SOLN
60.0000 mg | Freq: Once | INTRAMUSCULAR | Status: AC
  Administered 2019-06-04: 60 mg via INTRAMUSCULAR

## 2019-06-04 NOTE — Progress Notes (Signed)
Subjective  CC:  Chief Complaint  Patient presents with  . Migraine    Currently having/at the end of one.. Started 9/16.. Reports nausea, vomiting, dizziness, light and sound sensitivity. HA is more in the front.. Has tried Excedrin. Tylenol and Imitrex    HPI: Norma [name] is a 28 y.o. female who presents to the office today to address the problems listed above in the chief complaint.  See last note. Headaches persist. Reports onset with first pregnancy about 4 years ago. Better during pregnancy. Now with hysterectomy so not having periods; not certain if headaches are increased around ovulation or at end of cycle but will start monitoring. Now with 5 days of migrainous headache: started with visual aura, then n/vm photophobia, phonophobia and throbbing bilateral headache but also with "facial soreness" and TMJ pain. She awakens clenching her teeth. No new stressors.  Mildly improved with excedrin but vomited after taking 2nd imitrex. Dull headache persists now. No paresis. No worst headache of her life.   However, headaches are now more intense,last longer and are more frequent.   No FH of migraines or brain cancer. Never has had headache evaluation. No f/c/s or stiff neck.   Assessment  1. Migraine aura, persistent, intractable   2. Bruxism   3. Intractable episodic tension-type headache   4. Intractable episodic paroxysmal hemicrania   5. Need for immunization against influenza      Plan   Combination headaches: migrainous, TMJ and tension:  toradol today to break intractable headache. rec headache log, night mouth guard, and trial of maxalt melt for recurrent sxs. rec brain MRI due to progressive worsening of headaches. To ER for severe sxs or neuro sxs. Patient understands and agrees with care plan.    Follow up: Return in about 8 weeks (around 07/30/2019) for recheck headaches.  Visit date not found  Orders Placed This Encounter  Procedures  . MR Brain W Wo Contrast   . Flu Vaccine QUAD 36+ mos IM  . Basic metabolic panel   Meds ordered this encounter  Medications  . rizatriptan (MAXALT-MLT) 10 MG disintegrating tablet    Sig: Take 1 tablet (10 mg total) by mouth as needed for migraine. May repeat in 2 hours if needed    Dispense:  10 tablet    Refill:  0  . ketorolac (TORADOL) injection 60 mg      I reviewed the patients updated PMH, FH, and SocHx.    Patient Active Problem List   Diagnosis Date Noted  . Chronic allergic rhinitis 05/07/2019  . S/P hysterectomy 09/25/2018  . 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 ADHD (attention deficit hyperactivity disorder) 01/30/2015  . Exercise-induced asthma 01/30/2015  . 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 acetaminophen (TYLENOL) 500 MG tablet Take 2 tablets (1,000 mg total) by mouth every 6 (six) hours.  Marland Kitchen albuterol (PROVENTIL HFA;VENTOLIN HFA) 108 (90 Base) MCG/ACT inhaler Inhale 2 puffs into the lungs every 6 (six) hours as needed for wheezing or shortness of breath.  . lisdexamfetamine (VYVANSE) 30 MG capsule Take 30 mg by mouth daily.  . Prenatal Vit-Fe Fumarate-FA (PRENATAL MULTIVITAMIN) TABS tablet Take 1 tablet by mouth every other day.   . SUMAtriptan (IMITREX) 50 MG tablet Take 1 tablet (50 mg total) by mouth once for 1 dose. May repeat in 2 hours if headache persists or recurs.    Allergies: Patient has No Known  Allergies. Family History: Patient family history includes Healthy in her daughter. Social History:  Patient  reports that she has never smoked. She has never used smokeless tobacco. She reports current alcohol use. She reports that she does not use drugs.  Review of Systems: Constitutional: Negative for fever malaise or anorexia Cardiovascular: negative for chest pain Respiratory: negative for SOB or persistent cough Gastrointestinal: negative for abdominal pain  Objective  Vitals: BP 124/82   Pulse 84    Temp 97.8 F (36.6 C) (Tympanic)   Resp 16   Ht 5\' 6"  (1.676 m)   Wt 172 lb (78 kg)   LMP 09/02/2018 (Exact Date)   SpO2 98%   BMI 27.76 kg/m  General: no acute distress , A&Ox3 HEENT: PEERL, conjunctiva normal, Oropharynx moist,neck is supple Cardiovascular:  RRR without murmur or gallop.  Respiratory:  Good breath sounds bilaterally, CTAB with normal respiratory effort Skin:  Warm, no rashes Neuro: nonfocal exam     Commons side effects, risks, benefits, and alternatives for medications and treatment plan prescribed today were discussed, and the patient expressed understanding of the given instructions. Patient is instructed to call or message via MyChart if he/she has any questions or concerns regarding our treatment plan. No barriers to understanding were identified. We discussed Red Flag symptoms and signs in detail. Patient expressed understanding regarding what to do in case of urgent or emergency type symptoms.   Medication list was reconciled, printed and provided to the patient in AVS. Patient instructions and summary information was reviewed with the patient as documented in the AVS. This note was prepared with assistance of Dragon voice recognition software. Occasional wrong-word or sound-a-like substitutions may have occurred due to the inherent limitations of voice recognition software

## 2019-06-04 NOTE — Patient Instructions (Signed)
Please return in 6-8 weeks with headache log to reassess your headaches.   Try the Maxalt for next migraine like headache.  You may use advil 3 tabs every 8 hours as needed as well.   I have ordered a brain MRI to ensure that your anatomy is normal and that there are no brain issues that are contributing to your worsening symptoms.  We may need to work with your insurance company to get this approved.   Get a night guard to help stop clenching and grinding your teeth at night as this may be contributing to your headaches.   Today you were given your flu vaccination.   If you have any questions or concerns, please don't hesitate to send me a message via MyChart or call the office at [phone]. Thank you for visiting with [place] today! It's our pleasure caring for you.   Migraine Headache A migraine headache is an intense, throbbing pain on one side or both sides of the head. Migraine headaches may also cause other symptoms, such as nausea, vomiting, and sensitivity to light and noise. A migraine headache can last from 4 hours to 3 days. Talk with your doctor about what things may bring on (trigger) your migraine headaches. What are the causes? The exact cause of this condition is not known. However, a migraine may be caused when nerves in the brain become irritated and release chemicals that cause inflammation of blood vessels. This inflammation causes pain. This condition may be triggered or caused by:  Drinking alcohol.  Smoking.  Taking medicines, such as: ? Medicine used to treat chest pain (nitroglycerin). ? Birth control pills. ? Estrogen. ? Certain blood pressure medicines.  Eating or drinking products that contain nitrates, glutamate, aspartame, or tyramine. Aged cheeses, chocolate, or caffeine may also be triggers.  Doing physical activity. Other things that may trigger a migraine headache include:  Menstruation.  Pregnancy.  Hunger.  Stress.  Lack of sleep or too  much sleep.  Weather changes.  Fatigue. What increases the risk? The following factors may make you more likely to experience migraine headaches:  Being a certain age. This condition is more common in people who are 6-72 years old.  Being female.  Having a family history of migraine headaches.  Being Caucasian.  Having a mental health condition, such as depression or anxiety.  Being obese. What are the signs or symptoms? The main symptom of this condition is pulsating or throbbing pain. This pain may:  Happen in any area of the head, such as on one side or both sides.  Interfere with daily activities.  Get worse with physical activity.  Get worse with exposure to bright lights or loud noises. Other symptoms may include:  Nausea.  Vomiting.  Dizziness.  General sensitivity to bright lights, loud noises, or smells. Before you get a migraine headache, you may get warning signs (an aura). An aura may include:  Seeing flashing lights or having blind spots.  Seeing bright spots, halos, or zigzag lines.  Having tunnel vision or blurred vision.  Having numbness or a tingling feeling.  Having trouble talking.  Having muscle weakness. Some people have symptoms after a migraine headache (postdromal phase), such as:  Feeling tired.  Difficulty concentrating. How is this diagnosed? A migraine headache can be diagnosed based on:  Your symptoms.  A physical exam.  Tests, such as: ? CT scan or an MRI of the head. These imaging tests can help rule out other causes of headaches. ?  Taking fluid from the spine (lumbar puncture) and analyzing it (cerebrospinal fluid analysis, or CSF analysis). How is this treated? This condition may be treated with medicines that:  Relieve pain.  Relieve nausea.  Prevent migraine headaches. Treatment for this condition may also include:  Acupuncture.  Lifestyle changes like avoiding foods that trigger migraine headaches.   Biofeedback.  Cognitive behavioral therapy. Follow these instructions at home: Medicines  Take over-the-counter and prescription medicines only as told by your health care provider.  Ask your health care provider if the medicine prescribed to you: ? Requires you to avoid driving or using heavy machinery. ? Can cause constipation. You may need to take these actions to prevent or treat constipation:  Drink enough fluid to keep your urine pale yellow.  Take over-the-counter or prescription medicines.  Eat foods that are high in fiber, such as beans, whole grains, and fresh fruits and vegetables.  Limit foods that are high in fat and processed sugars, such as fried or sweet foods. Lifestyle  Do not drink alcohol.  Do not use any products that contain nicotine or tobacco, such as cigarettes, e-cigarettes, and chewing tobacco. If you need help quitting, ask your health care provider.  Get at least 8 hours of sleep every night.  Find ways to manage stress, such as meditation, deep breathing, or yoga. General instructions      Keep a journal to find out what may trigger your migraine headaches. For example, write down: ? What you eat and drink. ? How much sleep you get. ? Any change to your diet or medicines.  If you have a migraine headache: ? Avoid things that make your symptoms worse, such as bright lights. ? It may help to lie down in a dark, quiet room. ? Do not drive or use heavy machinery. ? Ask your health care provider what activities are safe for you while you are experiencing symptoms.  Keep all follow-up visits as told by your health care provider. This is important. Contact a health care provider if:  You develop symptoms that are different or more severe than your usual migraine headache symptoms.  You have more than 15 headache days in one month. Get help right away if:  Your migraine headache becomes severe.  Your migraine headache lasts longer than 72  hours.  You have a fever.  You have a stiff neck.  You have vision loss.  Your muscles feel weak or like you cannot control them.  You start to lose your balance often.  You have trouble walking.  You faint.  You have a seizure. Summary  A migraine headache is an intense, throbbing pain on one side or both sides of the head. Migraines may also cause other symptoms, such as nausea, vomiting, and sensitivity to light and noise.  This condition may be treated with medicines and lifestyle changes. You may also need to avoid certain things that trigger a migraine headache.  Keep a journal to find out what may trigger your migraine headaches.  Contact your health care provider if you have more than 15 headache days in a month or you develop symptoms that are different or more severe than your usual migraine headache symptoms. This information is not intended to replace advice given to you by your health care provider. Make sure you discuss any questions you have with your health care provider. Document Released: 08/30/2005 Document Revised: 12/22/2018 Document Reviewed: 10/12/2018 Elsevier Patient Education  2020 [employer]   Teeth Grinding  Teeth grinding is a common condition in which a person grinds or clenches his or her teeth. This condition is also called bruxism. You can grind or clench your teeth without knowing that you are doing it. It can be done during the day or at night while you are sleeping. This can lead to tooth damage and jaw pain. What are the causes? The cause of this condition is not known. However, teeth grinding may be triggered by:  Teeth that do not line up correctly (malocclusion).  Stress or worry.  Earaches.  Allergies.  Some medicines.  Upper respiratory infections.  Sleep disorders. What increases the risk? This condition is more likely to develop in:  Children.  People who have a family history of teeth grinding.  People who consume  nicotine, caffeine, or excess amounts of alcoholbefore sleeping.  People who are stressed. What are the signs or symptoms? Symptoms of this condition include:  Earaches.  Teeth that are sensitive to heat, cold, and sweetness.  Damaged teeth.  Jaw pain or facial pain.  Jaw problems, such as hearing a clicking sound when you open or close your mouth.  Headaches.  Muscle spasms.  Trouble sleeping.  Trouble eating. In some cases, there are no symptoms. How is this diagnosed? This condition is diagnosed with a medical history and dental exam. To rule out other conditions, you may also have other tests, including:  X-rays.  Blood tests. How is this treated? There is no cure for this condition, but treatment can help control your symptoms and prevent further damage to your teeth. Treatment may include:  A night guard. This is a mouth guard that is fitted to your teeth. It places a barrier between your top teeth and your bottom teeth so that you grind on the night guard instead.  A mouth guard that moves your lower jaw forward (mandibular advancement device).  Dental correction of teeth that do not line up correctly. This may include braces.  Medicine to relax your jaw muscles.  Methods to reduce your stress, such as: ? Hypnosis. ? Relaxation techniques to use before going to sleep. Follow these instructions at home: Lifestyle   Try to ease your stress with exercise, yoga, or meditation. Talk with your health care provider if you need help to lessen your stress.  Try to get 8 hours of sleep every night. Sleep in a cool, dark, and quiet room. Keep computers and work out of the bedroom.  Avoid sleeping on your back. Try sleeping on your side or your abdomen.  Avoid: ? Chewing gum. ? Eating tough or hard foods, such as candy. ? Alcohol, caffeine, and nicotine, especially before bedtime.  Drink enough fluid to keep your urine pale yellow.  Try to keep your face,  mouth, and jaw muscles relaxed. General instructions   If you were given a mouth guard, wear it as told by your health care provider.  Take over-the-counter and prescription medicines only as told by your health care provider.  Your health care provider may recommend applying a warm compress to your face to help relax your jaw muscles. Do this as told by your health care provider.  Do jaw exercises as told by your health care provider.  Keep all follow-up visits as told by your health care provider. This is important. Contact a health care provider if you have:  Symptoms that get worse.  New symptoms.  Trouble eating.  Trouble opening your mouth. Summary  Teeth grinding is a common  condition where you grind or clench your teeth.  Symptoms can include jaw problems, facial pain, sensitive teeth, headaches, or trouble eating or sleeping.  There is no cure, but treatment can help ease symptoms and prevent further damage to the teeth. Options include wearing a mouth guard, having dental work, taking medicine, and finding ways to ease stress. This information is not intended to replace advice given to you by your health care provider. Make sure you discuss any questions you have with your health care provider. Document Released: 09/02/2003 Document Revised: 08/12/2017 Document Reviewed: 08/03/2017 Elsevier Patient Education  2020 [employer].

## 2019-06-18 ENCOUNTER — Other Ambulatory Visit: Payer: Self-pay | Admitting: Family Medicine

## 2019-06-18 MED ORDER — RIZATRIPTAN BENZOATE 10 MG PO TBDP: 10.0000 mg | ORAL_TABLET | ORAL | 0 refills | Status: DC | PRN

## 2019-06-18 NOTE — Telephone Encounter (Signed)
Medication Refill - Medication: rizatriptan (MAXALT-MLT) 10 MG disintegrating tablet   Has the patient contacted their pharmacy? Yes.   (Agent: If no, request that the patient contact the pharmacy for the refill.) (Agent: If yes, when and what did the pharmacy advise?)  Preferred Pharmacy (with phone number or street name):  [place]  [place] [address]  Phone: [phone] Fax: [phone]     Agent: Please be advised that RX refills may take up to 3 business days. We ask that you follow-up with your pharmacy.

## 2019-06-22 ENCOUNTER — Other Ambulatory Visit: Payer: Self-pay

## 2019-06-22 DIAGNOSIS — Z20822 Contact with and (suspected) exposure to covid-19: Secondary | ICD-10-CM

## 2019-06-22 DIAGNOSIS — Z20828 Contact with and (suspected) exposure to other viral communicable diseases: Secondary | ICD-10-CM | POA: Diagnosis not present

## 2019-06-23 LAB — NOVEL CORONAVIRUS, NAA: SARS-CoV-2, NAA: NOT DETECTED

## 2019-06-27 DIAGNOSIS — F411 Generalized anxiety disorder: Secondary | ICD-10-CM | POA: Diagnosis not present

## 2019-06-27 DIAGNOSIS — F901 Attention-deficit hyperactivity disorder, predominantly hyperactive type: Secondary | ICD-10-CM | POA: Diagnosis not present

## 2019-06-27 DIAGNOSIS — F429 Obsessive-compulsive disorder, unspecified: Secondary | ICD-10-CM | POA: Diagnosis not present

## 2019-07-04 ENCOUNTER — Other Ambulatory Visit: Payer: Self-pay

## 2019-07-04 ENCOUNTER — Ambulatory Visit
Admission: RE | Admit: 2019-07-04 | Discharge: 2019-07-04 | Disposition: A | Discharge status: Home / Self Care | Payer: BC Managed Care – PPO | Source: Ambulatory Visit | Attending: Family Medicine | Admitting: Family Medicine

## 2019-07-04 DIAGNOSIS — G44031 Episodic paroxysmal hemicrania, intractable: Secondary | ICD-10-CM

## 2019-07-04 DIAGNOSIS — G935 Compression of brain: Secondary | ICD-10-CM | POA: Diagnosis not present

## 2019-07-04 MED ORDER — GADOBENATE DIMEGLUMINE 529 MG/ML IV SOLN
15.0000 mL | Freq: Once | INTRAVENOUS | Status: AC | PRN
  Administered 2019-07-04: 15 mL via INTRAVENOUS

## 2019-07-05 NOTE — Progress Notes (Signed)
Please call patient: I have reviewed his/her lab results. MRI of brain looks fine. No worrisome findings present. Follow up as directed.

## 2019-07-25 ENCOUNTER — Other Ambulatory Visit: Payer: Self-pay

## 2019-07-25 DIAGNOSIS — Z20822 Contact with and (suspected) exposure to covid-19: Secondary | ICD-10-CM

## 2019-07-26 LAB — NOVEL CORONAVIRUS, NAA: SARS-CoV-2, NAA: NOT DETECTED

## 2019-07-30 ENCOUNTER — Ambulatory Visit: Payer: BC Managed Care – PPO | Admitting: Family Medicine

## 2019-08-01 DIAGNOSIS — F901 Attention-deficit hyperactivity disorder, predominantly hyperactive type: Secondary | ICD-10-CM | POA: Diagnosis not present

## 2019-08-01 DIAGNOSIS — F429 Obsessive-compulsive disorder, unspecified: Secondary | ICD-10-CM | POA: Diagnosis not present

## 2019-08-01 DIAGNOSIS — F411 Generalized anxiety disorder: Secondary | ICD-10-CM | POA: Diagnosis not present

## 2019-08-02 ENCOUNTER — Encounter: Payer: Self-pay | Admitting: Family Medicine

## 2019-08-06 ENCOUNTER — Other Ambulatory Visit: Payer: Self-pay

## 2019-08-06 DIAGNOSIS — Z20822 Contact with and (suspected) exposure to covid-19: Secondary | ICD-10-CM

## 2019-08-07 LAB — NOVEL CORONAVIRUS, NAA: SARS-CoV-2, NAA: NOT DETECTED

## 2019-08-23 DIAGNOSIS — D225 Melanocytic nevi of trunk: Secondary | ICD-10-CM | POA: Diagnosis not present

## 2019-08-23 DIAGNOSIS — L821 Other seborrheic keratosis: Secondary | ICD-10-CM | POA: Diagnosis not present

## 2019-08-23 DIAGNOSIS — D1801 Hemangioma of skin and subcutaneous tissue: Secondary | ICD-10-CM | POA: Diagnosis not present

## 2019-08-23 DIAGNOSIS — L720 Epidermal cyst: Secondary | ICD-10-CM | POA: Diagnosis not present

## 2019-08-28 DIAGNOSIS — L720 Epidermal cyst: Secondary | ICD-10-CM | POA: Diagnosis not present

## 2019-08-29 DIAGNOSIS — F901 Attention-deficit hyperactivity disorder, predominantly hyperactive type: Secondary | ICD-10-CM | POA: Diagnosis not present

## 2019-08-29 DIAGNOSIS — F411 Generalized anxiety disorder: Secondary | ICD-10-CM | POA: Diagnosis not present

## 2019-08-29 DIAGNOSIS — F429 Obsessive-compulsive disorder, unspecified: Secondary | ICD-10-CM | POA: Diagnosis not present

## 2019-08-31 ENCOUNTER — Other Ambulatory Visit: Payer: Self-pay

## 2019-08-31 DIAGNOSIS — Z20828 Contact with and (suspected) exposure to other viral communicable diseases: Secondary | ICD-10-CM | POA: Diagnosis not present

## 2019-08-31 DIAGNOSIS — Z20822 Contact with and (suspected) exposure to covid-19: Secondary | ICD-10-CM

## 2019-09-02 LAB — NOVEL CORONAVIRUS, NAA: SARS-CoV-2, NAA: NOT DETECTED

## 2019-09-04 ENCOUNTER — Other Ambulatory Visit: Payer: BC Managed Care – PPO

## 2019-09-10 ENCOUNTER — Other Ambulatory Visit: Payer: BC Managed Care – PPO

## 2019-09-10 ENCOUNTER — Other Ambulatory Visit: Payer: Self-pay

## 2019-09-10 DIAGNOSIS — Z20828 Contact with and (suspected) exposure to other viral communicable diseases: Secondary | ICD-10-CM | POA: Diagnosis not present

## 2019-09-10 DIAGNOSIS — Z20822 Contact with and (suspected) exposure to covid-19: Secondary | ICD-10-CM

## 2019-09-12 LAB — NOVEL CORONAVIRUS, NAA: SARS-CoV-2, NAA: NOT DETECTED

## 2019-09-26 DIAGNOSIS — F411 Generalized anxiety disorder: Secondary | ICD-10-CM | POA: Diagnosis not present

## 2019-09-26 DIAGNOSIS — F429 Obsessive-compulsive disorder, unspecified: Secondary | ICD-10-CM | POA: Diagnosis not present

## 2019-09-26 DIAGNOSIS — F901 Attention-deficit hyperactivity disorder, predominantly hyperactive type: Secondary | ICD-10-CM | POA: Diagnosis not present

## 2019-10-11 DIAGNOSIS — Z01419 Encounter for gynecological examination (general) (routine) without abnormal findings: Secondary | ICD-10-CM | POA: Diagnosis not present

## 2019-10-11 DIAGNOSIS — Z6826 Body mass index (BMI) 26.0-26.9, adult: Secondary | ICD-10-CM | POA: Diagnosis not present

## 2019-10-11 DIAGNOSIS — R102 Pelvic and perineal pain: Secondary | ICD-10-CM | POA: Diagnosis not present

## 2019-10-11 DIAGNOSIS — G43909 Migraine, unspecified, not intractable, without status migrainosus: Secondary | ICD-10-CM | POA: Diagnosis not present

## 2019-11-21 IMAGING — US US PELVIS COMPLETE
1 series · 15 of 25 positions shown · non-contrast
Comparison: None.

CLINICAL DATA: Initial evaluation for acute postpartum bleeding

EXAM:
TRANSABDOMINAL ULTRASOUND OF PELVIS
TECHNIQUE: Transabdominal ultrasound examination of the pelvis was performed
including evaluation of the uterus, ovaries, adnexal regions, and
pelvic cul-de-sac.

[Series 1: us pelvis complete · 30 acquisitions, 15 frames shown]
[im 1/30]
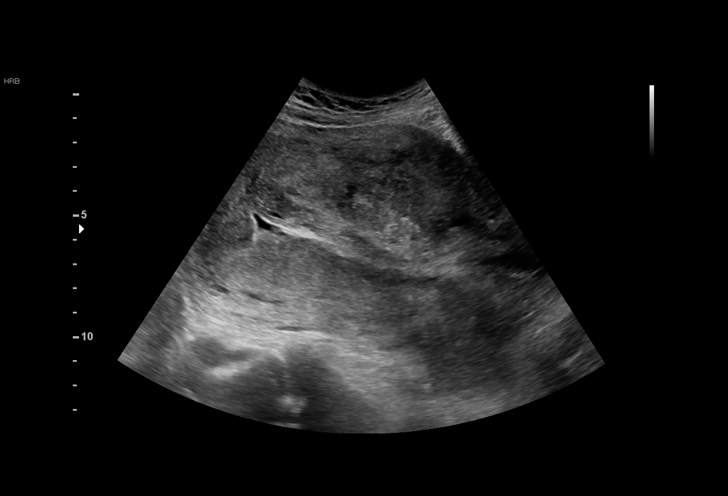
[im 3/30]
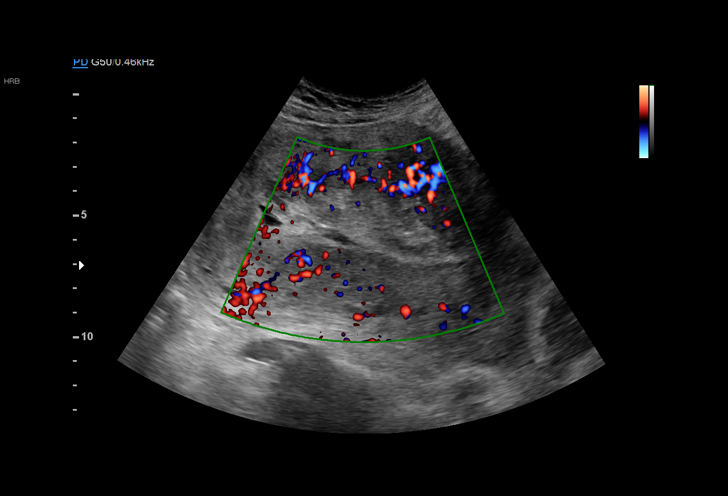
[im 5/30]
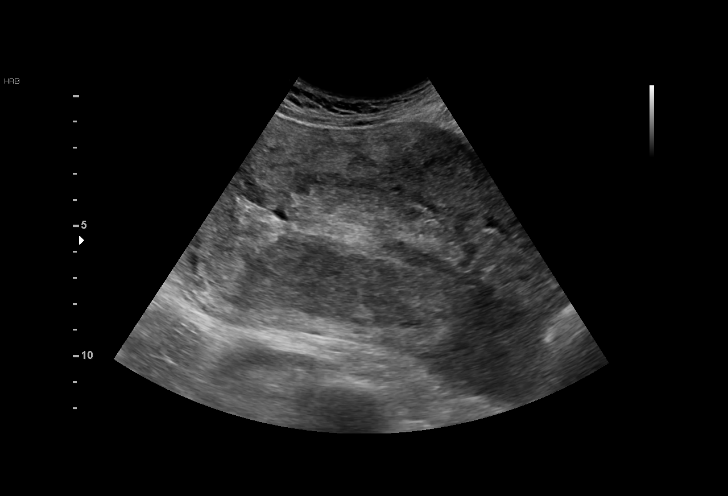
[im 7/30]
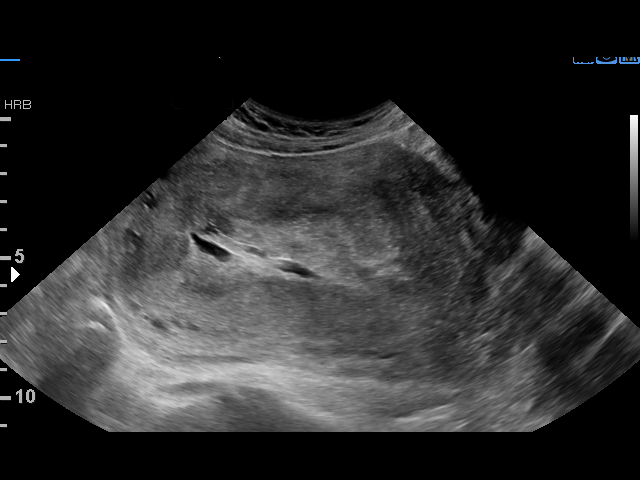
[im 9/30]
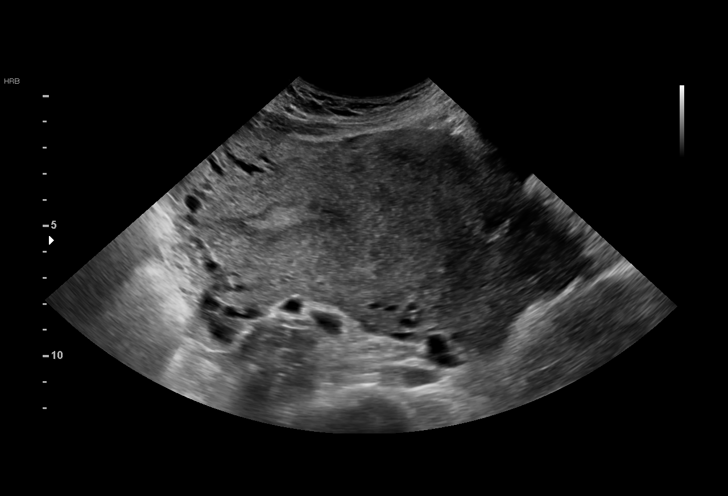
[im 11/30]
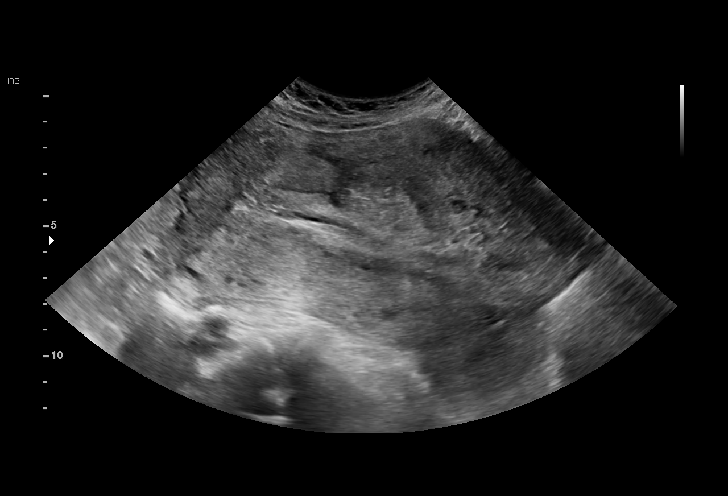
[im 13/30]
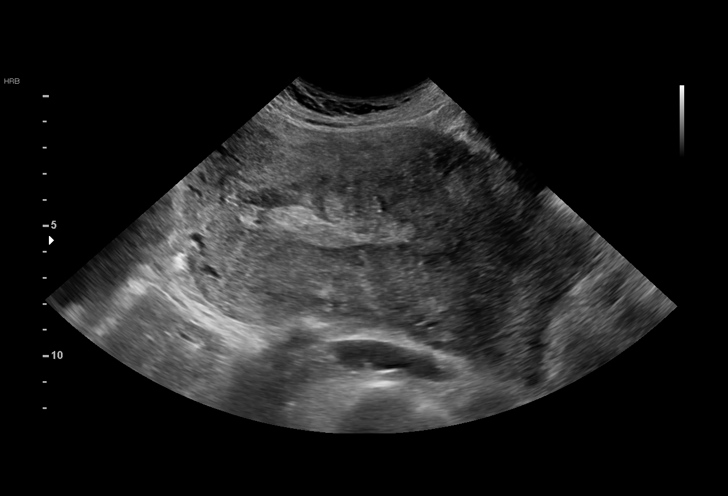
[im 15/30]
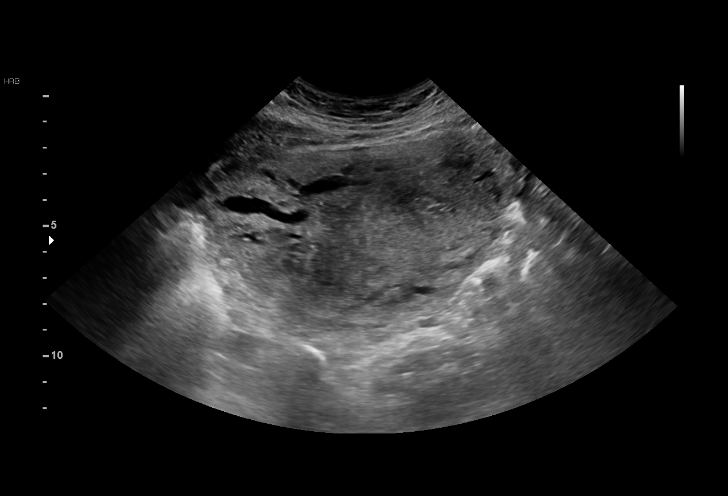
[im 17/30]
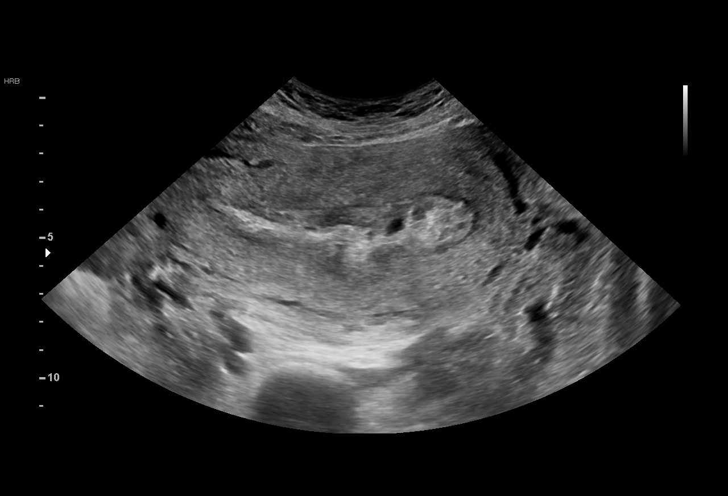
[im 19/30]
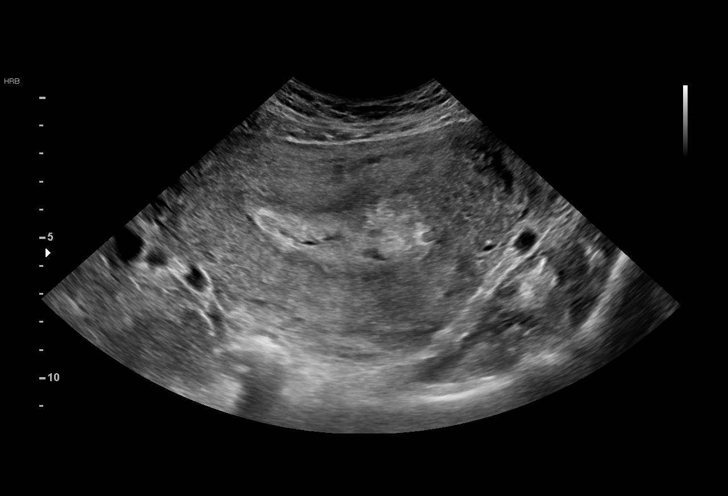
[im 21/30]
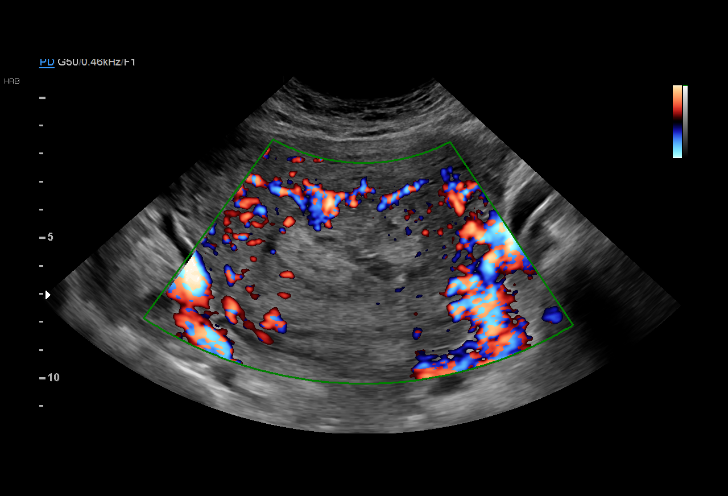
[im 23/30]
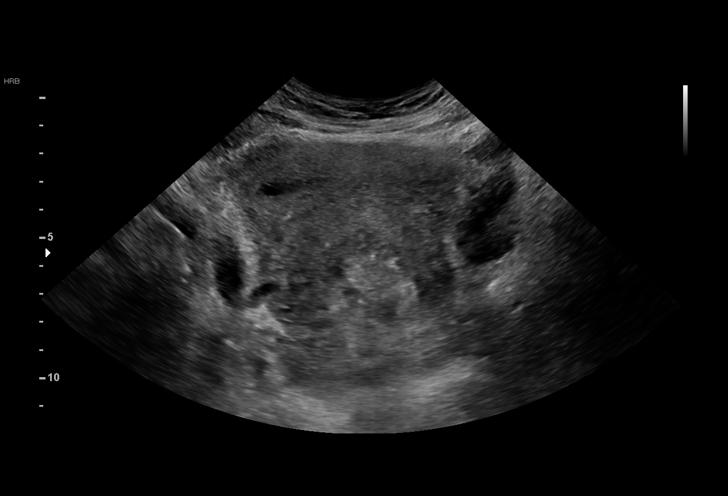
[im 25/30]
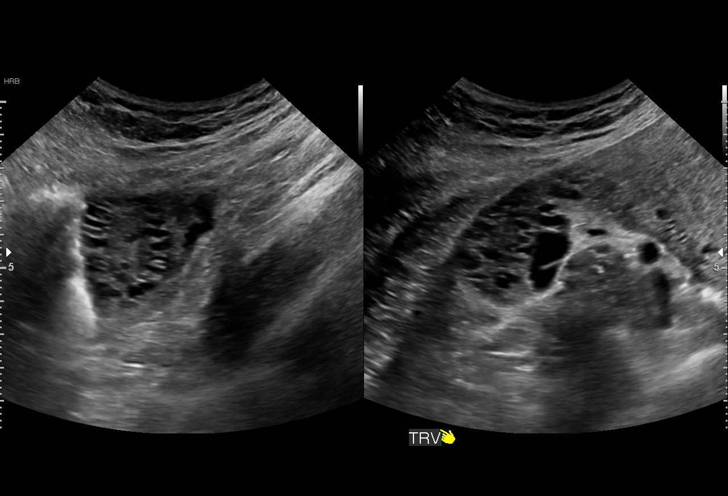
[im 27/30]
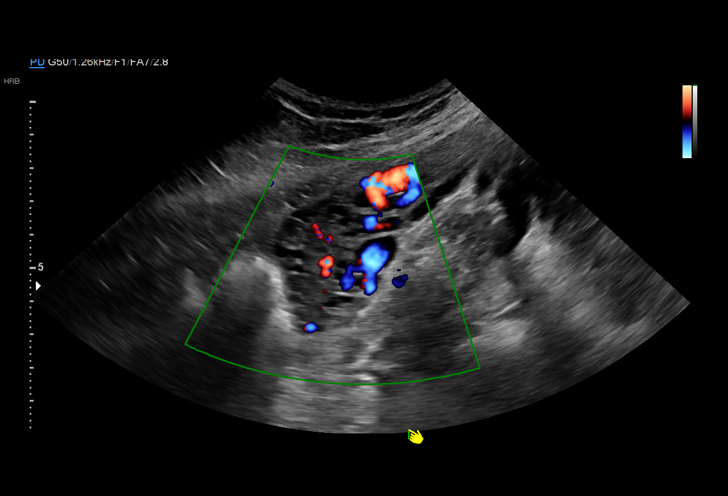
[im 30/30]
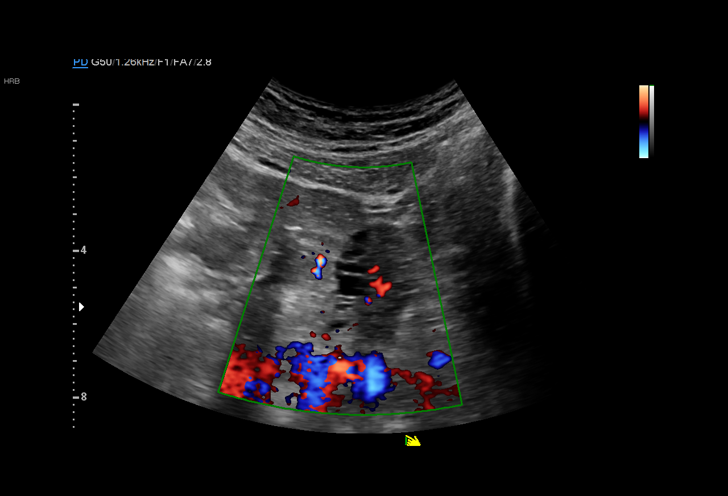

[15 of 25 positions shown; findings below may reference images not displayed]

FINDINGS: Uterus

Measurements: 15.7 x 8.5 x 11.4 cm. No fibroids or other mass
visualized.

Endometrium

Thickness: 29.4 mm. Prominent heterogeneous echogenic material
within the endometrial cavity. Few scant areas of internal
vascularity, raising the possibility for possible retained products.

Right ovary

Measurements: 4.0 x 3.5 x 3.4 cm. Normal appearance/no adnexal mass.

Left ovary

Measurements: 4.5 x 2.4 x 2.5 cm. Normal appearance/no adnexal mass.

Other findings:  No abnormal free fluid.
IMPRESSION: 1. Echogenic material within the endometrial cavity measuring up to
29 mm in thickness with scant areas of internal vascularity. Finding
raises the possibility for retained products of conception.
2. Otherwise normal pelvic ultrasound.

## 2019-11-23 DIAGNOSIS — F411 Generalized anxiety disorder: Secondary | ICD-10-CM | POA: Diagnosis not present

## 2019-11-23 DIAGNOSIS — F901 Attention-deficit hyperactivity disorder, predominantly hyperactive type: Secondary | ICD-10-CM | POA: Diagnosis not present

## 2019-11-23 DIAGNOSIS — F429 Obsessive-compulsive disorder, unspecified: Secondary | ICD-10-CM | POA: Diagnosis not present

## 2019-12-21 DIAGNOSIS — F901 Attention-deficit hyperactivity disorder, predominantly hyperactive type: Secondary | ICD-10-CM | POA: Diagnosis not present

## 2019-12-21 DIAGNOSIS — F411 Generalized anxiety disorder: Secondary | ICD-10-CM | POA: Diagnosis not present

## 2019-12-21 DIAGNOSIS — F429 Obsessive-compulsive disorder, unspecified: Secondary | ICD-10-CM | POA: Diagnosis not present

## 2019-12-26 ENCOUNTER — Ambulatory Visit: Payer: BC Managed Care – PPO | Attending: Internal Medicine

## 2019-12-26 DIAGNOSIS — Z20822 Contact with and (suspected) exposure to covid-19: Secondary | ICD-10-CM | POA: Diagnosis not present

## 2019-12-27 LAB — NOVEL CORONAVIRUS, NAA: SARS-CoV-2, NAA: NOT DETECTED

## 2019-12-27 LAB — SARS-COV-2, NAA 2 DAY TAT

## 2020-02-06 ENCOUNTER — Ambulatory Visit: Payer: BLUE CROSS/BLUE SHIELD | Admitting: Physician Assistant

## 2020-02-06 ENCOUNTER — Encounter: Payer: Self-pay | Admitting: Adult Health

## 2020-02-06 ENCOUNTER — Ambulatory Visit (INDEPENDENT_AMBULATORY_CARE_PROVIDER_SITE_OTHER): Payer: BC Managed Care – PPO | Admitting: Adult Health

## 2020-02-06 ENCOUNTER — Ambulatory Visit: Payer: BC Managed Care – PPO | Admitting: Psychiatry

## 2020-02-06 ENCOUNTER — Other Ambulatory Visit: Payer: Self-pay

## 2020-02-06 VITALS — BP 130/83 | HR 95 | Ht 68.0 in | Wt 166.0 lb

## 2020-02-06 DIAGNOSIS — F909 Attention-deficit hyperactivity disorder, unspecified type: Secondary | ICD-10-CM | POA: Diagnosis not present

## 2020-02-06 DIAGNOSIS — F411 Generalized anxiety disorder: Secondary | ICD-10-CM | POA: Diagnosis not present

## 2020-02-06 MED ORDER — LISDEXAMFETAMINE DIMESYLATE 40 MG PO CAPS: 40.0000 mg | ORAL_CAPSULE | ORAL | 0 refills | Status: DC

## 2020-02-06 MED ORDER — FLUOXETINE HCL 10 MG PO CAPS: ORAL_CAPSULE | ORAL | 2 refills | Status: DC
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
## 2020-02-07 ENCOUNTER — Other Ambulatory Visit: Payer: Self-pay

## 2020-02-07 DIAGNOSIS — F411 Generalized anxiety disorder: Secondary | ICD-10-CM

## 2020-02-07 MED ORDER — FLUOXETINE HCL 10 MG PO CAPS: ORAL_CAPSULE | ORAL | 0 refills | Status: DC

## 2020-03-05 ENCOUNTER — Ambulatory Visit (INDEPENDENT_AMBULATORY_CARE_PROVIDER_SITE_OTHER): Payer: BC Managed Care – PPO | Admitting: Adult Health

## 2020-03-05 ENCOUNTER — Other Ambulatory Visit: Payer: Self-pay

## 2020-03-05 ENCOUNTER — Encounter: Payer: Self-pay | Admitting: Adult Health

## 2020-03-05 VITALS — BP 111/73 | HR 92

## 2020-03-05 DIAGNOSIS — F411 Generalized anxiety disorder: Secondary | ICD-10-CM

## 2020-03-05 DIAGNOSIS — F909 Attention-deficit hyperactivity disorder, unspecified type: Secondary | ICD-10-CM

## 2020-03-05 MED ORDER — FLUOXETINE HCL 20 MG PO CAPS: ORAL_CAPSULE | ORAL | 5 refills | Status: DC

## 2020-03-05 MED ORDER — LISDEXAMFETAMINE DIMESYLATE 50 MG PO CAPS: 50.0000 mg | ORAL_CAPSULE | Freq: Every day | ORAL | 0 refills | Status: DC

## 2020-03-05 NOTE — Progress Notes (Signed)
Norma [name] [id number] 08/02/1991 29 y.o.  Subjective:   Patient ID:  Norma [name] is a 29 y.o. (DOB May 12, 1991) female.  Chief Complaint: No chief complaint on file.   HPI Norma [name] presents to the office today for follow-up of GAD and ADHD.  Describes mood today as "ok". Pleasant. Mood symptoms - denies depression and irritability. Reports increased anxiety at times. Trying to plan out week on Sundays and cover all the things she needs to get done. Feels like mood has been more stable. She and husband getting along well. Feels overwhelmed on "high anxiety" days. Not waking up feeling "nauseas". Feels better during the day and then things "pile up" in the evenings. Stable interest and motivation. Taking medications as prescribed.  Energy levels stable. Active, has a regular exercise routine.  Enjoys some usual interests and activities. Married. Lives with husband of 8 years and 3 children - 5-s, 3-d, 2-s - out of school for the summer. Husband working 3rd shift. Husbands family local. She has family in [place]. Spending time with family.  Appetite adequate. Weight loss - 3 pounds - 163 pounds. Sleeps well most nights. Averages 6 to 7 hours. Has stopped taking Melatonin.  Focus and concentration improved. Diagnosed with ADHD in childhood. Completing tasks. Managing aspects of household. 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  Denies SI or HI. Denies AH or VH.  Previous medication trials: Vyvanse, Zoloft   Review of Systems:  Review of Systems  Musculoskeletal: Negative for gait problem.  Neurological: Negative for tremors.  Psychiatric/Behavioral:       Please refer to HPI    Medications: I have reviewed the patient's current medications.  Current Outpatient Medications  Medication Sig Dispense Refill  . acetaminophen (TYLENOL) 500 MG tablet Take 2 tablets (1,000 mg total) by mouth every 6 (six)  hours. 30 tablet 0  . albuterol (PROVENTIL HFA;VENTOLIN HFA) 108 (90 Base) MCG/ACT inhaler Inhale 2 puffs into the lungs every 6 (six) hours as needed for wheezing or shortness of breath.    Marland Kitchen FLUoxetine (PROZAC) 20 MG capsule Take one capsule daily. 30 capsule 5  . lisdexamfetamine (VYVANSE) 50 MG capsule Take 1 capsule (50 mg total) by mouth daily. 30 capsule 0  . Prenatal Vit-Fe Fumarate-FA (PRENATAL MULTIVITAMIN) TABS tablet Take 1 tablet by mouth every other day.     . rizatriptan (MAXALT-MLT) 10 MG disintegrating tablet Take 1 tablet (10 mg total) by mouth as needed for migraine. May repeat in 2 hours if needed 10 tablet 0  . SUMAtriptan (IMITREX) 50 MG tablet Take 1 tablet (50 mg total) by mouth once for 1 dose. May repeat in 2 hours if headache persists or recurs. 10 tablet 1   No current facility-administered medications for this visit.    Medication Side Effects: None  Allergies: No Known Allergies  Past Medical History:  Diagnosis Date  . Anemia   . Asthma    exercise induced  . Chronic allergic rhinitis 05/07/2019  . Dysmenorrhea   . Right ureteral stone     Family History  Problem Relation Age of Onset  . Healthy Daughter     Social History   Socioeconomic History  . Marital status: Married    Spouse name: Not on file  . Number of children: 3  . Years of education: Not on file  . Highest education level: Not on file  Occupational History  . Occupation:  stay at home mom  Tobacco Use  . Smoking status: Never Smoker  . Smokeless tobacco: Never Used  Vaping Use  . Vaping Use: Never used  Substance and Sexual Activity  . Alcohol use: Yes    Comment: occasional when not pregnant  . Drug use: No  . Sexual activity: Not Currently    Birth control/protection: None, Surgical  Other Topics Concern  . Not on file  Social History Narrative  . Not on file   Social Determinants of Health   Financial Resource Strain:   . Difficulty of Paying Living Expenses:    Food Insecurity:   . Worried About Programme researcher, broadcasting/film/video in the Last Year:   . Barista in the Last Year:   Transportation Needs:   . Freight forwarder (Medical):   Marland Kitchen Lack of Transportation (Non-Medical):   Physical Activity:   . Days of Exercise per Week:   . Minutes of Exercise per Session:   Stress:   . Feeling of Stress :   Social Connections:   . Frequency of Communication with Friends and Family:   . Frequency of Social Gatherings with Friends and Family:   . Attends Religious Services:   . Active Member of Clubs or Organizations:   . Attends Banker Meetings:   Marland Kitchen Marital Status:   Intimate Partner Violence:   . Fear of Current or Ex-Partner:   . Emotionally Abused:   Marland Kitchen Physically Abused:   . Sexually Abused:     Past Medical History, Surgical history, Social history, and Family history were reviewed and updated as appropriate.   Please see review of systems for further details on the patient's review from today.   Objective:   Physical Exam:  BP 111/73   Pulse 92   LMP 09/02/2018 (Exact Date)   Physical Exam Constitutional:      General: She is not in acute distress. Musculoskeletal:        General: No deformity.  Neurological:     Mental Status: She is alert and oriented to person, place, and time.     Coordination: Coordination normal.  Psychiatric:        Attention and Perception: Attention and perception normal. She does not perceive auditory or visual hallucinations.        Mood and Affect: Mood normal. Mood is not anxious or depressed. Affect is not labile, blunt, angry or inappropriate.        Speech: Speech normal.        Behavior: Behavior normal.        Thought Content: Thought content normal. Thought content is not paranoid or delusional. Thought content does not include homicidal or suicidal ideation. Thought content does not include homicidal or suicidal plan.        Cognition and Memory: Cognition and memory normal.         Judgment: Judgment normal.     Comments: Insight intact     Lab Review:     Component Value Date/Time   NA 142 03/18/2014 1218   K 3.6 (L) 03/18/2014 1218   CL 104 03/18/2014 1218   CO2 25 03/18/2014 1218   GLUCOSE 111 (H) 03/18/2014 1218   BUN 9 03/18/2014 1218   CREATININE 0.73 03/18/2014 1218   CALCIUM 9.6 03/18/2014 1218   PROT 7.3 03/18/2014 1218   ALBUMIN 4.1 03/18/2014 1218   AST 17 03/18/2014 1218   ALT 10 03/18/2014 1218   ALKPHOS 69 03/18/2014 1218  BILITOT 0.3 03/18/2014 1218   GFRNONAA >90 03/18/2014 1218   GFRAA >90 03/18/2014 1218       Component Value Date/Time   WBC 6.6 10/28/2018 0043   RBC 3.41 (L) 10/28/2018 0043   HGB 10.6 (L) 10/28/2018 0043   HCT 32.2 (L) 10/28/2018 0043   PLT 203 10/28/2018 0043   MCV 94.4 10/28/2018 0043   MCH 31.1 10/28/2018 0043   MCHC 32.9 10/28/2018 0043   RDW 12.4 10/28/2018 0043   LYMPHSABS 2.1 03/18/2014 1218   MONOABS 0.3 03/18/2014 1218   EOSABS 0.2 03/18/2014 1218   BASOSABS 0.0 03/18/2014 1218    No results found for: POCLITH, LITHIUM   No results found for: PHENYTOIN, PHENOBARB, VALPROATE, CBMZ   .res Assessment: Plan:    Plan:  PDMP reviewed  1. Increase Vyvanse from 40mg  to 50mg  daily 2. Prozac 20mg  daily  RTC 4 weeks  Patient advised to contact office with any questions, adverse effects, or acute worsening in signs and symptoms.  Discussed potential benefits, risks, and side effects of stimulants with patient to include increased heart rate, palpitations, insomnia, increased anxiety, increased irritability, or decreased appetite.  Instructed patient to contact office if experiencing any significant tolerability issues.     Diagnoses and all orders for this visit:  Generalized anxiety disorder -     FLUoxetine (PROZAC) 20 MG capsule; Take one capsule daily.  Attention deficit hyperactivity disorder (ADHD), unspecified ADHD type -     lisdexamfetamine (VYVANSE) 50 MG capsule; Take 1  capsule (50 mg total) by mouth daily.     Please see After Visit Summary for patient specific instructions.  No future appointments.  No orders of the defined types were placed in this encounter.   -------------------------------

## 2020-04-02 ENCOUNTER — Other Ambulatory Visit: Payer: Self-pay

## 2020-04-02 ENCOUNTER — Ambulatory Visit (INDEPENDENT_AMBULATORY_CARE_PROVIDER_SITE_OTHER): Payer: BC Managed Care – PPO | Admitting: Adult Health

## 2020-04-02 ENCOUNTER — Encounter: Payer: Self-pay | Admitting: Adult Health

## 2020-04-02 DIAGNOSIS — F411 Generalized anxiety disorder: Secondary | ICD-10-CM | POA: Diagnosis not present

## 2020-04-02 DIAGNOSIS — F909 Attention-deficit hyperactivity disorder, unspecified type: Secondary | ICD-10-CM | POA: Diagnosis not present

## 2020-04-02 MED ORDER — LISDEXAMFETAMINE DIMESYLATE 50 MG PO CAPS: 50.0000 mg | ORAL_CAPSULE | Freq: Every day | ORAL | 0 refills | Status: DC

## 2020-04-02 NOTE — Progress Notes (Signed)
Norma [name] [id number] 1991/07/13 29 y.o.  Subjective:   Patient ID:  Norma [name] is a 29 y.o. (DOB 08-14-1991) female.  Chief Complaint: No chief complaint on file.   HPI [name] presents to the office today for follow-up of GAD and ADHD.  Describes mood today as "ok". Pleasant. Mood symptoms - denies depression and irritability. Reports decreased anxiety. Not feeling overwhelmed. No longer waking up with "dread". Able to get up in the mornings and get things done. Decreased "obsessive" thoughts". Plans week out on Sunday - "that is helpful". Not getting irritated when day doesn't go as planned. Feels like mood is "level". Is able to get up and "enjoy the day". She and family doing well. . Stable interest and motivation. Taking medications as prescribed.  Energy levels stable. Active, has a regular exercise routine.  Enjoys some usual interests and activities. Married. Lives with husband of 8 years and 3 children - 5-s, 3-d, 2-s - out of school for the summer. Son starting kindergarten this year. Husband working 3rd shift. Husbands family local. She has family in [place]. Spending time with family.  Appetite adequate. Weight stable 163 pounds. Sleeps well most nights. Averages 7 to 8 hours.   Focus and concentration improved with increase in Vyvanse. Completing tasks. Managing aspects of household. 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  Denies SI or HI. Denies AH or VH.   Review of Systems:  Review of Systems  Musculoskeletal: Negative for gait problem.  Neurological: Negative for tremors.  Psychiatric/Behavioral:       Please refer to HPI    Medications: I have reviewed the patient's current medications.  Current Outpatient Medications  Medication Sig Dispense Refill   acetaminophen (TYLENOL) 500 MG tablet Take 2 tablets (1,000 mg total) by mouth every 6 (six) hours. 30 tablet 0   albuterol (PROVENTIL HFA;VENTOLIN HFA) 108 (90 Base)  MCG/ACT inhaler Inhale 2 puffs into the lungs every 6 (six) hours as needed for wheezing or shortness of breath.     FLUoxetine (PROZAC) 20 MG capsule Take one capsule daily. 30 capsule 5   lisdexamfetamine (VYVANSE) 50 MG capsule Take 1 capsule (50 mg total) by mouth daily. 30 capsule 0   [START ON 04/30/2020] lisdexamfetamine (VYVANSE) 50 MG capsule Take 1 capsule (50 mg total) by mouth daily. 30 capsule 0   [START ON 05/28/2020] lisdexamfetamine (VYVANSE) 50 MG capsule Take 1 capsule (50 mg total) by mouth daily. 30 capsule 0   Prenatal Vit-Fe Fumarate-FA (PRENATAL MULTIVITAMIN) TABS tablet Take 1 tablet by mouth every other day.      rizatriptan (MAXALT-MLT) 10 MG disintegrating tablet Take 1 tablet (10 mg total) by mouth as needed for migraine. May repeat in 2 hours if needed 10 tablet 0   SUMAtriptan (IMITREX) 50 MG tablet Take 1 tablet (50 mg total) by mouth once for 1 dose. May repeat in 2 hours if headache persists or recurs. 10 tablet 1   No current facility-administered medications for this visit.    Medication Side Effects: None  Allergies: Not on File  Past Medical History:  Diagnosis Date   Anemia    Asthma    exercise induced   Chronic allergic rhinitis 05/07/2019   Dysmenorrhea    Right ureteral stone     Family History  Problem Relation Age of Onset   Healthy Daughter     Social History   Socioeconomic History   Marital status: Married  Spouse name: Not on file   Number of children: 3   Years of education: Not on file   Highest education level: Not on file  Occupational History   Occupation: stay at home mom  Tobacco Use   Smoking status: Never Smoker   Smokeless tobacco: Never Used  Vaping Use   Vaping Use: Never used  Substance and Sexual Activity   Alcohol use: Yes    Comment: occasional when not pregnant   Drug use: No   Sexual activity: Not Currently    Birth control/protection: None, Surgical  Other Topics Concern    Not on file  Social History Narrative   Not on file   Social Determinants of Health   Financial Resource Strain:    Difficulty of Paying Living Expenses:   Food Insecurity:    Worried About Programme researcher, broadcasting/film/video in the Last Year:    Barista in the Last Year:   Transportation Needs:    Freight forwarder (Medical):    Lack of Transportation (Non-Medical):   Physical Activity:    Days of Exercise per Week:    Minutes of Exercise per Session:   Stress:    Feeling of Stress :   Social Connections:    Frequency of Communication with Friends and Family:    Frequency of Social Gatherings with Friends and Family:    Attends Religious Services:    Active Member of Clubs or Organizations:    Attends Engineer, structural:    Marital Status:   Intimate Partner Violence:    Fear of Current or Ex-Partner:    Emotionally Abused:    Physically Abused:    Sexually Abused:     Past Medical History, Surgical history, Social history, and Family history were reviewed and updated as appropriate.   Please see review of systems for further details on the patient's review from today.   Objective:   Physical Exam:  LMP 09/02/2018 (Exact Date)   Physical Exam Constitutional:      General: She is not in acute distress. Musculoskeletal:        General: No deformity.  Neurological:     Mental Status: She is alert and oriented to person, place, and time.     Coordination: Coordination normal.  Psychiatric:        Attention and Perception: Attention and perception normal. She does not perceive auditory or visual hallucinations.        Mood and Affect: Mood normal. Mood is not anxious or depressed. Affect is not labile, blunt, angry or inappropriate.        Speech: Speech normal.        Behavior: Behavior normal.        Thought Content: Thought content normal. Thought content is not paranoid or delusional. Thought content does not include homicidal or  suicidal ideation. Thought content does not include homicidal or suicidal plan.        Cognition and Memory: Cognition and memory normal.        Judgment: Judgment normal.     Comments: Insight intact     Lab Review:     Component Value Date/Time   NA 142 03/18/2014 1218   K 3.6 (L) 03/18/2014 1218   CL 104 03/18/2014 1218   CO2 25 03/18/2014 1218   GLUCOSE 111 (H) 03/18/2014 1218   BUN 9 03/18/2014 1218   CREATININE 0.73 03/18/2014 1218   CALCIUM 9.6 03/18/2014 1218   PROT 7.3 03/18/2014  1218   ALBUMIN 4.1 03/18/2014 1218   AST 17 03/18/2014 1218   ALT 10 03/18/2014 1218   ALKPHOS 69 03/18/2014 1218   BILITOT 0.3 03/18/2014 1218   GFRNONAA >90 03/18/2014 1218   GFRAA >90 03/18/2014 1218       Component Value Date/Time   WBC 6.6 10/28/2018 0043   RBC 3.41 (L) 10/28/2018 0043   HGB 10.6 (L) 10/28/2018 0043   HCT 32.2 (L) 10/28/2018 0043   PLT 203 10/28/2018 0043   MCV 94.4 10/28/2018 0043   MCH 31.1 10/28/2018 0043   MCHC 32.9 10/28/2018 0043   RDW 12.4 10/28/2018 0043   LYMPHSABS 2.1 03/18/2014 1218   MONOABS 0.3 03/18/2014 1218   EOSABS 0.2 03/18/2014 1218   BASOSABS 0.0 03/18/2014 1218    No results found for: POCLITH, LITHIUM   No results found for: PHENYTOIN, PHENOBARB, VALPROATE, CBMZ   .res Assessment: Plan:    Plan:  PDMP reviewed  1. Vyvanse 50mg  daily 2. Prozac 20mg  daily  BP - 128/82/93  RTC 3 months  Patient advised to contact office with any questions, adverse effects, or acute worsening in signs and symptoms.  Discussed potential benefits, risks, and side effects of stimulants with patient to include increased heart rate, palpitations, insomnia, increased anxiety, increased irritability, or decreased appetite.  Instructed patient to contact office if experiencing any significant tolerability issues.    Diagnoses and all orders for this visit:  Generalized anxiety disorder  Attention deficit hyperactivity disorder (ADHD),  unspecified ADHD type -     lisdexamfetamine (VYVANSE) 50 MG capsule; Take 1 capsule (50 mg total) by mouth daily. -     lisdexamfetamine (VYVANSE) 50 MG capsule; Take 1 capsule (50 mg total) by mouth daily. -     lisdexamfetamine (VYVANSE) 50 MG capsule; Take 1 capsule (50 mg total) by mouth daily.     Please see After Visit Summary for patient specific instructions.  No future appointments.  No orders of the defined types were placed in this encounter.   -------------------------------

## 2020-05-26 ENCOUNTER — Emergency Department (HOSPITAL_COMMUNITY)
Admission: EM | Admit: 2020-05-26 | Discharge: 2020-05-26 | Disposition: A | Discharge status: Home / Self Care | Payer: BC Managed Care – PPO | Attending: Emergency Medicine | Admitting: Emergency Medicine

## 2020-05-26 ENCOUNTER — Other Ambulatory Visit: Payer: Self-pay

## 2020-05-26 ENCOUNTER — Emergency Department (HOSPITAL_COMMUNITY): Payer: BC Managed Care – PPO

## 2020-05-26 ENCOUNTER — Encounter (HOSPITAL_COMMUNITY): Payer: Self-pay | Admitting: Emergency Medicine

## 2020-05-26 DIAGNOSIS — R109 Unspecified abdominal pain: Secondary | ICD-10-CM

## 2020-05-26 DIAGNOSIS — R112 Nausea with vomiting, unspecified: Secondary | ICD-10-CM | POA: Insufficient documentation

## 2020-05-26 DIAGNOSIS — N201 Calculus of ureter: Secondary | ICD-10-CM | POA: Insufficient documentation

## 2020-05-26 DIAGNOSIS — Z87442 Personal history of urinary calculi: Secondary | ICD-10-CM | POA: Diagnosis not present

## 2020-05-26 DIAGNOSIS — J45909 Unspecified asthma, uncomplicated: Secondary | ICD-10-CM | POA: Diagnosis not present

## 2020-05-26 DIAGNOSIS — Z7951 Long term (current) use of inhaled steroids: Secondary | ICD-10-CM | POA: Insufficient documentation

## 2020-05-26 DIAGNOSIS — N132 Hydronephrosis with renal and ureteral calculous obstruction: Secondary | ICD-10-CM | POA: Diagnosis not present

## 2020-05-26 LAB — URINALYSIS, ROUTINE W REFLEX MICROSCOPIC
Bilirubin Urine: NEGATIVE
Glucose, UA: NEGATIVE mg/dL
Ketones, ur: NEGATIVE mg/dL
Leukocytes,Ua: NEGATIVE
Nitrite: NEGATIVE
Protein, ur: NEGATIVE mg/dL
Specific Gravity, Urine: 1.014 (ref 1.005–1.030)
pH: 7 (ref 5.0–8.0)

## 2020-05-26 LAB — CBC
HCT: 38.5 % (ref 36.0–46.0)
Hemoglobin: 13 g/dL (ref 12.0–15.0)
MCH: 32.6 pg (ref 26.0–34.0)
MCHC: 33.8 g/dL (ref 30.0–36.0)
MCV: 96.5 fL (ref 80.0–100.0)
Platelets: 198 10*3/uL (ref 150–400)
RBC: 3.99 MIL/uL (ref 3.87–5.11)
RDW: 12.2 % (ref 11.5–15.5)
WBC: 8.4 10*3/uL (ref 4.0–10.5)
nRBC: 0 % (ref 0.0–0.2)

## 2020-05-26 LAB — COMPREHENSIVE METABOLIC PANEL
ALT: 18 U/L (ref 0–44)
AST: 17 U/L (ref 15–41)
Albumin: 4.2 g/dL (ref 3.5–5.0)
Alkaline Phosphatase: 65 U/L (ref 38–126)
Anion gap: 11 (ref 5–15)
BUN: 13 mg/dL (ref 6–20)
CO2: 23 mmol/L (ref 22–32)
Calcium: 9.9 mg/dL (ref 8.9–10.3)
Chloride: 105 mmol/L (ref 98–111)
Creatinine, Ser: 0.65 mg/dL (ref 0.44–1.00)
GFR calc Af Amer: >60 mL/min (ref >60)
GFR calc non Af Amer: >60 mL/min (ref >60)
Glucose, Bld: 123 mg/dL — ABNORMAL HIGH (ref 70–99)
Potassium: 3.5 mmol/L (ref 3.5–5.1)
Sodium: 139 mmol/L (ref 135–145)
Total Bilirubin: 0.5 mg/dL (ref 0.3–1.2)
Total Protein: 7.1 g/dL (ref 6.5–8.1)

## 2020-05-26 LAB — LIPASE, BLOOD: Lipase: 29 U/L (ref 11–51)

## 2020-05-26 LAB — I-STAT BETA HCG BLOOD, ED (MC, WL, AP ONLY): I-stat hCG, quantitative: <5 m[IU]/mL (ref <5)

## 2020-05-26 MED ORDER — TAMSULOSIN HCL 0.4 MG PO CAPS: 0.4000 mg | ORAL_CAPSULE | Freq: Every day | ORAL | 0 refills | Status: DC

## 2020-05-26 MED ORDER — OXYCODONE-ACETAMINOPHEN 5-325 MG PO TABS
2.0000 | ORAL_TABLET | Freq: Once | ORAL | Status: AC
  Administered 2020-05-26: 2 via ORAL
  Filled 2020-05-26: qty 2

## 2020-05-26 MED ORDER — OXYCODONE-ACETAMINOPHEN 5-325 MG PO TABS: 1.0000 | ORAL_TABLET | ORAL | 0 refills | Status: DC | PRN

## 2020-05-26 MED ORDER — ONDANSETRON 4 MG PO TBDP
4.0000 mg | ORAL_TABLET | Freq: Once | ORAL | Status: AC | PRN
  Administered 2020-05-26: 4 mg via ORAL
  Filled 2020-05-26: qty 1

## 2020-05-26 MED ORDER — ONDANSETRON 4 MG PO TBDP: 4.0000 mg | ORAL_TABLET | Freq: Three times a day (TID) | ORAL | 0 refills | Status: DC | PRN

## 2020-05-26 MED ORDER — KETOROLAC TROMETHAMINE 60 MG/2ML IM SOLN
60.0000 mg | Freq: Once | INTRAMUSCULAR | Status: AC
  Administered 2020-05-26: 60 mg via INTRAMUSCULAR
  Filled 2020-05-26: qty 2

## 2020-05-26 NOTE — ED Triage Notes (Signed)
Patient is complaining of left kidney stone. Patient states she is having left flank pain. Patient states that this started having pain around midnight.

## 2020-05-26 NOTE — Discharge Instructions (Signed)
Take the prescribed medication as directed.  Make sure to drink lots of water to stay hydrated. Follow-up with urology-- call for appt if needed. Return to the ED for new or worsening symptoms-- high fever, difficulty urinating, vomiting, cant tolerate meds, etc.

## 2020-05-26 NOTE — ED Provider Notes (Signed)
[facility]-EMERGENCY DEPT Provider Note   CSN: [id number] Arrival date & time: 05/26/20  0330     History Chief Complaint  Patient presents with  . Abdominal Pain  . Flank Pain    Norma [name] is a 29 y.o. female.  The history is provided by the patient and medical records.  Abdominal Pain Flank Pain Associated symptoms include abdominal pain.    29 year old female with history of anemia, asthma, dysmenorrhea, kidney stones, presenting to the ED with sudden onset left flank pain.  States she had a little bit of nausea yesterday but she attributed this to poor diet over the weekend.  Around midnight she woke up with severe pain in her left flank.  States she had some vomiting and one episode of nausea.  She did try taking Imodium thinking it was GI upset but she was not able to hold this down.  She denies any no pelvic pain or vaginal discharge.  No urinary symptoms.  Does state her bladder feels "full".  She has had 2 prior kidney stones in the past, these all passed spontaneously.  Past Medical History:  Diagnosis Date  . Anemia   . Asthma    exercise induced  . Chronic allergic rhinitis 05/07/2019  . Dysmenorrhea   . Right ureteral stone     Patient Active Problem List   Diagnosis Date Noted  . Chronic allergic rhinitis 05/07/2019  . S/P hysterectomy 09/25/2018  . 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 ADHD (attention deficit hyperactivity disorder) 01/30/2015  . Exercise-induced asthma 01/30/2015  . Calculus of kidney - passed spontaneously in Jul 2015 01/30/2015    Past Surgical History:  Procedure Laterality Date  . ABDOMINAL HYSTERECTOMY    . ADENOIDECTOMY    . ANTERIOR AND POSTERIOR REPAIR WITH SACROSPINOUS FIXATION N/A 09/25/2018   Procedure: ANTERIOR AND POSTERIOR REPAIR, [facility] CALLS;  Surgeon: Ranae Pila, MD;  Location: [facility];  Service: Gynecology;  Laterality: N/A;  . CYSTOSCOPY N/A 09/25/2018   Procedure: CYSTOSCOPY;   Surgeon: Ranae Pila, MD;  Location: [facility];  Service: Gynecology;  Laterality: N/A;  . DILATION AND CURETTAGE OF UTERUS N/A 03/30/2018   Procedure: DILATATION AND EVACUATION;  Surgeon: Richarda Overlie, MD;  Location: [facility];  Service: Gynecology;  Laterality: N/A;  . TONSILLECTOMY    . VAGINAL HYSTERECTOMY Bilateral 09/25/2018   Procedure: VAGINAL HYSTERECTOMY, BILATERAL FIMBRECTOMY;  Surgeon: Ranae Pila, MD;  Location: [facility];  Service: Gynecology;  Laterality: Bilateral;  . WISDOM TOOTH EXTRACTION       OB History    Gravida  3   Para  3   Term  3   Preterm      AB      Living  3     SAB      TAB      Ectopic      Multiple  0   Live Births  3           Family History  Problem Relation Age of Onset  . Healthy Daughter     Social History   Tobacco Use  . Smoking status: Never Smoker  . Smokeless tobacco: Never Used  Vaping Use  . Vaping Use: Never used  Substance Use Topics  . Alcohol use: Yes    Comment: occasional when not pregnant  . Drug use: No    Home Medications Prior to Admission medications   Medication Sig Start Date End Date  Taking? Authorizing Provider  acetaminophen (TYLENOL) 500 MG tablet Take 2 tablets (1,000 mg total) by mouth every 6 (six) hours. 09/26/18   Ranae Pila, MD  albuterol (PROVENTIL HFA;VENTOLIN HFA) 108 (90 Base) MCG/ACT inhaler Inhale 2 puffs into the lungs every 6 (six) hours as needed for wheezing or shortness of breath.    [provider]  FLUoxetine (PROZAC) 20 MG capsule Take one capsule daily. 03/05/20   Mozingo, Thereasa Solo, NP  lisdexamfetamine (VYVANSE) 50 MG capsule Take 1 capsule (50 mg total) by mouth daily. 04/02/20   Mozingo, Thereasa Solo, NP  lisdexamfetamine (VYVANSE) 50 MG capsule Take 1 capsule (50 mg total) by mouth daily. 04/30/20   Mozingo, Thereasa Solo, NP  lisdexamfetamine (VYVANSE) 50 MG capsule Take 1 capsule (50 mg total) by mouth daily. 05/28/20    Mozingo, Thereasa Solo, NP  Prenatal Vit-Fe Fumarate-FA (PRENATAL MULTIVITAMIN) TABS tablet Take 1 tablet by mouth every other day.     [provider]  rizatriptan (MAXALT-MLT) 10 MG disintegrating tablet Take 1 tablet (10 mg total) by mouth as needed for migraine. May repeat in 2 hours if needed 06/18/19   Willow Ora, MD  SUMAtriptan (IMITREX) 50 MG tablet Take 1 tablet (50 mg total) by mouth once for 1 dose. May repeat in 2 hours if headache persists or recurs. 05/07/19 06/04/19  Willow Ora, MD    Allergies    Patient has no allergy information on record.  Review of Systems   Review of Systems  Gastrointestinal: Positive for abdominal pain.  Genitourinary: Positive for flank pain.  All other systems reviewed and are negative.   Physical Exam Updated Vital Signs BP 100/74 (BP Location: Right Arm)   Pulse 89   Temp (!) 97.4 F (36.3 C) (Oral)   Resp 20   Ht 5\' 9"  (1.753 m)   Wt 72.6 kg   LMP 09/02/2018 (Exact Date)   SpO2 100%   BMI 23.63 kg/m   Physical Exam Vitals and nursing note reviewed.  Constitutional:      Appearance: She is well-developed.  HENT:     Head: Normocephalic and atraumatic.  Eyes:     Conjunctiva/sclera: Conjunctivae normal.     Pupils: Pupils are equal, round, and reactive to light.  Cardiovascular:     Rate and Rhythm: Normal rate and regular rhythm.     Heart sounds: Normal heart sounds.  Pulmonary:     Effort: Pulmonary effort is normal.     Breath sounds: Normal breath sounds.  Abdominal:     General: Bowel sounds are normal.     Palpations: Abdomen is soft.     Tenderness: There is no abdominal tenderness. There is no guarding or rebound.  Musculoskeletal:        General: Normal range of motion.     Cervical back: Normal range of motion.  Skin:    General: Skin is warm and dry.  Neurological:     Mental Status: She is alert and oriented to person, place, and time.     ED Results / Procedures / Treatments    Labs (all labs ordered are listed, but only abnormal results are displayed) Labs Reviewed  COMPREHENSIVE METABOLIC PANEL - Abnormal; Notable for the following components:      Result Value   Glucose, Bld 123 (*)    All other components within normal limits  URINALYSIS, ROUTINE W REFLEX MICROSCOPIC - Abnormal; Notable for the following components:   APPearance CLOUDY (*)  Hgb urine dipstick MODERATE (*)    Bacteria, UA RARE (*)    All other components within normal limits  LIPASE, BLOOD  CBC  I-STAT BETA HCG BLOOD, ED ([facility], [facility], [facility] ONLY)    EKG None  Radiology CT Renal Stone Study  Result Date: 05/26/2020 CLINICAL DATA:  Left kidney stone EXAM: CT ABDOMEN AND PELVIS WITHOUT CONTRAST TECHNIQUE: Multidetector CT imaging of the abdomen and pelvis was performed following the standard protocol without IV contrast. COMPARISON:  09/28/2018 FINDINGS: Lower chest:  No contributory findings. Hepatobiliary: No focal liver abnormality.No evidence of biliary obstruction or stone. Pancreas: Unremarkable. Spleen: Unremarkable. Adrenals/Urinary Tract: Negative adrenals. Mild left hydroureteronephrosis due to a 5 mm stone at the UVJ. At least 6 right and 5 left renal calculi measuring up to 5 mm in the interpolar region. Unremarkable bladder. Stomach/Bowel:  No obstruction. No appendicitis. Vascular/Lymphatic: No acute vascular abnormality. No mass or adenopathy. Reproductive:Hysterectomy. Other: No ascites or pneumoperitoneum. Musculoskeletal: No acute abnormalities. IMPRESSION: 1. Left hydroureteronephrosis from a 5 mm UVJ calculus. 2. Multiple bilateral renal calculi. Electronically Signed   By: Marnee Spring M.D.   On: 05/26/2020 04:19    Procedures Procedures (including critical care time)  Medications Ordered in ED Medications  ondansetron (ZOFRAN-ODT) disintegrating tablet 4 mg (4 mg Oral Given 05/26/20 0342)  ketorolac (TORADOL) injection 60 mg (60 mg Intramuscular Given 05/26/20 0530)   oxyCODONE-acetaminophen (PERCOCET/ROXICET) 5-325 MG per tablet 2 tablet (2 tablets Oral Given 05/26/20 3235)    ED Course  I have reviewed the triage vital signs and the nursing notes.  Pertinent labs & imaging results that were available during my care of the patient were reviewed by me and considered in my medical decision making (see chart for details).    MDM Rules/Calculators/A&P  29 year old female presenting to the ED with left flank pain.  Symptom onset around midnight.  Did vomit x1.  She is afebrile and nontoxic.  Labs obtained from triage are grossly reassuring.  No leukocytosis, normal renal function.  CT renal study was obtained from triage, 5 mm left UVJ calculus.  Plan for pain control.  UA pending.  6:15 AM UA without signs of infection. Pain well controlled here after IM Toradol and Percocet.  She is tolerating oral fluids without difficulty.  Remains hemodynamically stable.  Feel she stable for discharge home with continued symptomatic care.  Given location of stone, suspect this should pass within the next 48 to 72 hours.  She is given close urology follow-up if needed.  Return precautions for any new or acute changes.  Final Clinical Impression(s) / ED Diagnoses Final diagnoses:  Left ureteral stone  Left flank pain    Rx / DC Orders ED Discharge Orders         Ordered    oxyCODONE-acetaminophen (PERCOCET) 5-325 MG tablet  Every 4 hours PRN        05/26/20 0614    ondansetron (ZOFRAN ODT) 4 MG disintegrating tablet  Every 8 hours PRN        05/26/20 0614    tamsulosin (FLOMAX) 0.4 MG CAPS capsule  Daily after supper        05/26/20 0614           Garlon Hatchet, PA-C 05/26/20 0617    Molpus, Jonny Ruiz, MD 05/26/20 [phone]

## 2020-05-28 ENCOUNTER — Other Ambulatory Visit (HOSPITAL_COMMUNITY): Payer: BC Managed Care – PPO

## 2020-05-28 ENCOUNTER — Other Ambulatory Visit (HOSPITAL_COMMUNITY)
Admission: RE | Admit: 2020-05-28 | Discharge: 2020-05-28 | Disposition: A | Discharge status: Home / Self Care | Payer: BC Managed Care – PPO | Source: Ambulatory Visit | Attending: Urology | Admitting: Urology

## 2020-05-28 ENCOUNTER — Encounter (HOSPITAL_COMMUNITY): Payer: Self-pay | Admitting: Urology

## 2020-05-28 ENCOUNTER — Other Ambulatory Visit: Payer: Self-pay | Admitting: Urology

## 2020-05-28 DIAGNOSIS — Z01812 Encounter for preprocedural laboratory examination: Secondary | ICD-10-CM | POA: Insufficient documentation

## 2020-05-28 DIAGNOSIS — N202 Calculus of kidney with calculus of ureter: Secondary | ICD-10-CM | POA: Diagnosis not present

## 2020-05-28 DIAGNOSIS — R8271 Bacteriuria: Secondary | ICD-10-CM | POA: Diagnosis not present

## 2020-05-28 DIAGNOSIS — Z20822 Contact with and (suspected) exposure to covid-19: Secondary | ICD-10-CM | POA: Diagnosis not present

## 2020-05-28 LAB — SARS CORONAVIRUS 2 (TAT 6-24 HRS): SARS Coronavirus 2: NEGATIVE

## 2020-05-28 NOTE — Progress Notes (Signed)
COVID Vaccine Completed:None Date COVID Vaccine completed: COVID vaccine manufacturer: Pfizer    Moderna   [employer]   PCP - Asencion Partridge, MD Cardiologist -   Chest x-ray -  EKG -  Stress Test -  ECHO -  Cardiac Cath -  Pacemaker/ICD device last checked:  Sleep Study -  CPAP -   Fasting Blood Sugar -  Checks Blood Sugar _____ times a day  Blood Thinner Instructions: Aspirin Instructions: Last Dose:  Anesthesia review:   Patient denies shortness of breath, fever, cough and chest pain at PAT appointment   Patient verbalized understanding of instructions that were given to them at the PAT appointment. Patient was also instructed that they will need to review over the PAT instructions again at home before surgery.

## 2020-05-28 NOTE — H&P (Signed)
I have ureteral stone.  HPI: Norma [name] is a 29 year-old female established patient who is here for ureteral stone.  05/28/20: Patient with past history of urolithiasis. She presented to the emergency department on 09/13 with complaints of acute onset left flank pain. She had associated nausea at the time. Labs were reassuring without leukocytosis and renal function was noted to be normal. CT imaging was obtained and she was noted to have an approximately 5 mm left distal ureteral calculus at the level of the UVJ. She had some mild associated hydronephrosis. She had multiple nonobstructive renal calculi bilaterally, as well. Her pain was controlled in the emergency department and she was started on medical expulsive therapy. She returns today for ongoing evaluation. Currently she states that she continues to have intermittent episodes of moderate to severe pain. She has been using Percocet for pain management, which is effective. However, she has had to use this consistently for symptoms for pain to remain manageable Pain is currently generalized. She complains of bilateral flank pain, which can radiate to her abdomen at times. She continues to have some associated nausea, but denies emesis. No complaints of dysuria. She does complain of increased in urinary urgency, however. She denies fevers, but states she has had some intermittent chills. She has been using Zofran for nausea, which has been helpful. She is also using Tamsulosin.   The problem is on the right side.     ALLERGIES: No Allergies    MEDICATIONS: Tamsulosin Hcl 0.4 mg capsule  Ondansetron Hcl  Prozac  Vyvanse 50 mg capsule Oral  Zofran 4 mg tablet 1 tablet PO Q 8 H PRN     GU PSH: Hysterectomy       PSH Notes: Tonsillectomy With Adenoidectomy   NON-GU PSH: D&&C Remove Tonsils And Adenoids - 2015     GU PMH: Ureteral calculus - 05/14/2019, Right, She has symptoms consistent with a right distal stone but the area is  obscured by stool on KUB. Her renal stones are small so if there is a stone, it should be passable. I am going to give her tamsulosin and a strainer and have her return in 2 weeks for a repeat KUB, but if we can't get clarity we may need to consider a renal US or CT if she remains symptomatic. , - 04/30/2019, Calculus of right ureter, - 2015 Renal calculus, Bilateral - 04/30/2019    NON-GU PMH: Encounter for general adult medical examination without abnormal findings, Encounter for preventive health examination - 2015 Personal history of other diseases of the respiratory system, History of asthma - 2015 Personal history of other mental and behavioral disorders, History of attention deficit hyperactivity disorder - 2015 Anxiety Asthma    FAMILY HISTORY: Diabetes - Runs In Family   SOCIAL HISTORY: Marital Status: Married Preferred Language: English; Race: Black or African American Current Smoking Status: Patient has never smoked.   Tobacco Use Assessment Completed: Used Tobacco in last 30 days? Drinks 2 drinks per day.  Drinks 1 caffeinated drink per day.     Notes: 2 sons, 1 daughter   REVIEW OF SYSTEMS:    GU Review Female:   Patient reports frequent urination, trouble starting your stream, burning /pain with urination, and get up at night to urinate. Patient denies stream starts and stops, have to strain to urinate, leakage of urine, hard to postpone urination, and being pregnant.  Gastrointestinal (Upper):   Patient reports nausea and vomiting. Patient denies indigestion/ heartburn.  Gastrointestinal (Lower):  Patient reports constipation. Patient denies diarrhea.  Constitutional:   Patient reports night sweats and fatigue. Patient denies weight loss.  Skin:   Patient denies skin rash/ lesion and itching.  Eyes:   Patient denies blurred vision and double vision.  Ears/ Nose/ Throat:   Patient denies sore throat and sinus problems.  Hematologic/Lymphatic:   Patient denies swollen  glands and easy bruising.  Cardiovascular:   Patient denies leg swelling and chest pains.  Respiratory:   Patient denies cough and shortness of breath.  Endocrine:   Patient denies excessive thirst.  Musculoskeletal:   Patient reports back pain. Patient denies joint pain.  Neurological:   Patient denies headaches and dizziness.  Psychologic:   Patient denies depression and anxiety.   VITAL SIGNS:      05/28/2020 08:35 AM  Weight 160 lb / 72.57 kg  Height 66.5 in / 168.91 cm  BP 163/84 mmHg  Pulse 90 /min  Temperature 99.3 F / 37.3 C  BMI 25.4 kg/m  Notes: Oral temperature 98.4   MULTI-SYSTEM PHYSICAL EXAMINATION:    Constitutional: Well-nourished. No physical deformities. Normally developed. Good grooming.  Respiratory: Normal breath sounds. No labored breathing, no use of accessory muscles.   Cardiovascular: Regular rate and rhythm. No murmur, no gallop. Normal temperature, normal extremity pulses, no swelling, no varicosities.   Skin: No paleness, no jaundice, no cyanosis. No lesion, no ulcer, no rash.  Neurologic / Psychiatric: Oriented to time, oriented to place, oriented to person. No depression, no anxiety, no agitation.  Gastrointestinal: No mass, no tenderness, no rigidity, non obese abdomen. Mild CVAT.   Musculoskeletal: Normal gait and station of head and neck.     Complexity of Data:  Records Review:   Previous Doctor Records, Previous Patient Records  Urine Test Review:   Urinalysis, Urine Culture  X-Ray Review: KUB: Reviewed Films.  C.T. Abdomen/Pelvis: Reviewed Films. Reviewed Report.     PROCEDURES:         KUB - [id number]  A single view of the abdomen is obtained. Multiple small opacities noted within the confines of bilateral renal shadows. I do not appreciate any definitive opacities noted along the expected anatomical course of the right ureter. There does appear to be an approximately 5 mm opacity in the vicinity of the left UVJ. However, this is difficult to  determine definitively due to overlying bowel gas pattern. Prominent, but normal appearance of overlying bowel gas pattern.      Patient confirmed No Neulasta OnPro Device.            Renal Ultrasound - [id number]  Right Kidney: Length: 11.6 cm Depth:4.9 cm Cortical Width:1.5 cm Width: 6.7 cm  Left Kidney: Length: 12.9 cm Depth: 5.8 cm Cortical Width: 1.9 cm Width: 4.7 cm  Left Kidney/Ureter:  ? Multiple stones, largest= 0.37cm MP, ? Cystic area MP= 0.54cm  Right Kidney/Ureter:  ? Multiple stones, largest= 0.68cm LP  Bladder:  PVR= 43.75ML.      Patient confirmed No Neulasta OnPro Device.           Urinalysis w/Scope Dipstick Dipstick Cont'd Micro  Color: Yellow Bilirubin: Neg mg/dL WBC/hpf: 0 - 5/hpf  Appearance: Clear Ketones: Neg mg/dL RBC/hpf: 0 - 2/hpf  Specific Gravity: 1.015 Blood: Trace ery/uL Bacteria: Rare (0-9/hpf)  pH: 7.5 Protein: 1+ mg/dL Cystals: Amorph Phosphates  Glucose: Neg mg/dL Urobilinogen: 0.2 mg/dL Casts: NS (Not Seen)    Nitrites: Neg Trichomonas: Not Present    Leukocyte Esterase: Neg leu/uL Mucous: Present  Epithelial Cells: 6 - 10/hpf      Yeast: NS (Not Seen)      Sperm: Not Present    ASSESSMENT:      ICD-10 Details  1 GU:   Renal calculus - N20.0 Bilateral  2   Ureteral calculus - N20.1    PLAN:           Orders Labs Urine Culture  X-Rays: Renal Ultrasound    KUB  X-Ray Notes: History:  Hematuria: Yes/No  Patient to see MD after exam: Yes/No  Previous exam: CT / IVP/ US/ KUB/ None  When:  Where:  Diabetic: Yes/ No  BUN/ Creatinine:  Date of last BUN Creatinine:  Weight in pounds:  Allergy- IV Contrast: Yes/ No  Conflicting diabetic meds: Yes/ No  Diabetic Meds:  Prior Authorization #:            Schedule         Document Letter(s):  Created for Patient: Clinical Summary   Created for Chart: Work Excuse         Notes:   Urinalysis today is not urinalysis today is not overtly concerning for  infectious process, but I will send for urine culture as precaution. On KUB imaging today, there continues to be an opacity noted in the vicinity of the left UVJ, measuring approximately 5 mm. However, this is a difficult interpretation due to overlying bowel gas pattern. No obvious opacities noted along the expected anatomical course of her right ureter. Renal ultrasound is reassuring today without overt hydronephrosis. We reviewed treatment options today in detail including: Continued observation with medical expulsive therapy, ESWL, or ureteroscopy. We reviewed each procedure and associated potential risk in detail. She seems interested in moving forward with intervention. Ultimately, I will review recent imaging studies with her urologist for recommendations moving forward. I will keep her updated and follow-up will be pending. She will remain on medical expulsive therapy in the interim. She does not need any refills currently. I encouraged that she continue to hydrate adequately. Strict return precautions reviewed in the interim for fevers or refractory pain, nausea, or vomiting. She voiced understanding.        Next Appointment:      Next Appointment: 05/29/2020 09:00 AM    Appointment Type: Surgery     Location: [facility] [phone]    Provider: Bjorn Pippin, M.D.    Reason for Visit: OP [facility] CYSTO LT RTG URS HLL STENT

## 2020-05-29 ENCOUNTER — Encounter (HOSPITAL_COMMUNITY): Payer: Self-pay | Admitting: Urology

## 2020-05-29 ENCOUNTER — Encounter (HOSPITAL_COMMUNITY)
Admission: RE | Disposition: A | Discharge status: Home / Self Care | Payer: Self-pay | Source: Home / Self Care | Attending: Urology

## 2020-05-29 ENCOUNTER — Ambulatory Visit (HOSPITAL_COMMUNITY)
Admission: RE | Admit: 2020-05-29 | Discharge: 2020-05-29 | Disposition: A | Discharge status: Home / Self Care | Payer: BC Managed Care – PPO | Attending: Urology | Admitting: Urology

## 2020-05-29 ENCOUNTER — Ambulatory Visit (HOSPITAL_COMMUNITY): Payer: BC Managed Care – PPO

## 2020-05-29 ENCOUNTER — Ambulatory Visit (HOSPITAL_COMMUNITY): Payer: BC Managed Care – PPO | Admitting: Certified Registered Nurse Anesthetist

## 2020-05-29 DIAGNOSIS — F909 Attention-deficit hyperactivity disorder, unspecified type: Secondary | ICD-10-CM | POA: Insufficient documentation

## 2020-05-29 DIAGNOSIS — N201 Calculus of ureter: Secondary | ICD-10-CM | POA: Diagnosis not present

## 2020-05-29 DIAGNOSIS — Z79899 Other long term (current) drug therapy: Secondary | ICD-10-CM | POA: Insufficient documentation

## 2020-05-29 DIAGNOSIS — F419 Anxiety disorder, unspecified: Secondary | ICD-10-CM | POA: Diagnosis not present

## 2020-05-29 DIAGNOSIS — N202 Calculus of kidney with calculus of ureter: Secondary | ICD-10-CM | POA: Insufficient documentation

## 2020-05-29 HISTORY — PX: CYSTOSCOPY WITH RETROGRADE PYELOGRAM, URETEROSCOPY AND STENT PLACEMENT: SHX5789

## 2020-05-29 SURGERY — CYSTOURETEROSCOPY, WITH RETROGRADE PYELOGRAM AND STENT INSERTION
Anesthesia: General | Laterality: Left

## 2020-05-29 MED ORDER — KETOROLAC TROMETHAMINE 30 MG/ML IJ SOLN
INTRAMUSCULAR | Status: AC
  Filled 2020-05-29: qty 1

## 2020-05-29 MED ORDER — FENTANYL CITRATE (PF) 100 MCG/2ML IJ SOLN
INTRAMUSCULAR | Status: DC | PRN
Start: 2020-05-29 — End: 2020-05-29
  Administered 2020-05-29: 25 ug via INTRAVENOUS

## 2020-05-29 MED ORDER — FENTANYL CITRATE (PF) 100 MCG/2ML IJ SOLN: 25.0000 ug | INTRAMUSCULAR | Status: DC | PRN

## 2020-05-29 MED ORDER — SODIUM CHLORIDE 0.9 % IR SOLN
Status: DC | PRN
  Administered 2020-05-29: 3000 mL

## 2020-05-29 MED ORDER — IOHEXOL 300 MG/ML  SOLN
INTRAMUSCULAR | Status: DC | PRN
  Administered 2020-05-29: 3 mL

## 2020-05-29 MED ORDER — ACETAMINOPHEN 325 MG PO TABS: 325.0000 mg | ORAL_TABLET | Freq: Once | ORAL | Status: DC | PRN

## 2020-05-29 MED ORDER — MIDAZOLAM HCL 5 MG/5ML IJ SOLN
INTRAMUSCULAR | Status: DC | PRN
  Administered 2020-05-29: 2 mg via INTRAVENOUS

## 2020-05-29 MED ORDER — ACETAMINOPHEN 160 MG/5ML PO SOLN: 325.0000 mg | Freq: Once | ORAL | Status: DC | PRN

## 2020-05-29 MED ORDER — ONDANSETRON HCL 4 MG/2ML IJ SOLN
INTRAMUSCULAR | Status: DC | PRN
  Administered 2020-05-29: 4 mg via INTRAVENOUS

## 2020-05-29 MED ORDER — KETOROLAC TROMETHAMINE 30 MG/ML IJ SOLN
INTRAMUSCULAR | Status: DC | PRN
  Administered 2020-05-29: 30 mg via INTRAVENOUS

## 2020-05-29 MED ORDER — ONDANSETRON HCL 4 MG/2ML IJ SOLN
INTRAMUSCULAR | Status: AC
  Filled 2020-05-29: qty 2

## 2020-05-29 MED ORDER — PROPOFOL 10 MG/ML IV BOLUS
INTRAVENOUS | Status: AC
  Filled 2020-05-29: qty 20

## 2020-05-29 MED ORDER — CHLORHEXIDINE GLUCONATE 0.12 % MT SOLN
15.0000 mL | Freq: Once | OROMUCOSAL | Status: AC
  Administered 2020-05-29: 15 mL via OROMUCOSAL

## 2020-05-29 MED ORDER — ORAL CARE MOUTH RINSE: 15.0000 mL | Freq: Once | OROMUCOSAL | Status: AC

## 2020-05-29 MED ORDER — FENTANYL CITRATE (PF) 100 MCG/2ML IJ SOLN
INTRAMUSCULAR | Status: AC
  Filled 2020-05-29: qty 2

## 2020-05-29 MED ORDER — DEXAMETHASONE SODIUM PHOSPHATE 10 MG/ML IJ SOLN
INTRAMUSCULAR | Status: DC | PRN
  Administered 2020-05-29: 10 mg via INTRAVENOUS

## 2020-05-29 MED ORDER — MIDAZOLAM HCL 2 MG/2ML IJ SOLN
INTRAMUSCULAR | Status: AC
  Filled 2020-05-29: qty 2

## 2020-05-29 MED ORDER — LIDOCAINE 2% (20 MG/ML) 5 ML SYRINGE
INTRAMUSCULAR | Status: DC | PRN
  Administered 2020-05-29: 25 mg via INTRAVENOUS

## 2020-05-29 MED ORDER — DEXAMETHASONE SODIUM PHOSPHATE 10 MG/ML IJ SOLN
INTRAMUSCULAR | Status: AC
  Filled 2020-05-29: qty 1

## 2020-05-29 MED ORDER — CEFAZOLIN SODIUM-DEXTROSE 2-4 GM/100ML-% IV SOLN
2.0000 g | INTRAVENOUS | Status: AC
  Administered 2020-05-29: 2 g via INTRAVENOUS
  Filled 2020-05-29: qty 100

## 2020-05-29 MED ORDER — MEPERIDINE HCL 50 MG/ML IJ SOLN: 6.2500 mg | INTRAMUSCULAR | Status: DC | PRN

## 2020-05-29 MED ORDER — PROPOFOL 10 MG/ML IV BOLUS
INTRAVENOUS | Status: DC | PRN
  Administered 2020-05-29: 200 mg via INTRAVENOUS

## 2020-05-29 MED ORDER — LACTATED RINGERS IV SOLN: INTRAVENOUS | Status: DC

## 2020-05-29 MED ORDER — LIDOCAINE 2% (20 MG/ML) 5 ML SYRINGE
INTRAMUSCULAR | Status: AC
  Filled 2020-05-29: qty 5

## 2020-05-29 MED ORDER — SODIUM CHLORIDE 0.9% FLUSH: 3.0000 mL | Freq: Two times a day (BID) | INTRAVENOUS | Status: DC

## 2020-05-29 MED ORDER — AMISULPRIDE (ANTIEMETIC) 5 MG/2ML IV SOLN: 10.0000 mg | Freq: Once | INTRAVENOUS | Status: DC | PRN

## 2020-05-29 MED ORDER — ACETAMINOPHEN 10 MG/ML IV SOLN: 1000.0000 mg | Freq: Once | INTRAVENOUS | Status: DC | PRN

## 2020-05-29 SURGICAL SUPPLY — 24 items
BAG URO CATCHER STRL LF (MISCELLANEOUS) ×3 IMPLANT
BASKET STONE NCOMPASS (UROLOGICAL SUPPLIES) IMPLANT
CATH URET 5FR 28IN OPEN ENDED (CATHETERS) IMPLANT
CATH URET DUAL LUMEN 6-10FR 50 (CATHETERS) IMPLANT
CLOTH BEACON ORANGE TIMEOUT ST (SAFETY) ×3 IMPLANT
EXTRACTOR STONE NITINOL NGAGE (UROLOGICAL SUPPLIES) ×3 IMPLANT
FIBER LASER MOSES 200 DFL (Laser) ×3 IMPLANT
GLOVE SURG SS PI 8.0 STRL IVOR (GLOVE) ×3 IMPLANT
GOWN STRL REUS W/TWL XL LVL3 (GOWN DISPOSABLE) ×3 IMPLANT
GUIDEWIRE STR DUAL SENSOR (WIRE) ×3 IMPLANT
IV NS IRRIG 3000ML ARTHROMATIC (IV SOLUTION) ×3 IMPLANT
KIT TURNOVER KIT A (KITS) IMPLANT
LASER FIB FLEXIVA PULSE ID 365 (Laser) IMPLANT
LASER FIB FLEXIVA PULSE ID 550 (Laser) IMPLANT
LASER FIB FLEXIVA PULSE ID 910 (Laser) IMPLANT
MANIFOLD NEPTUNE II (INSTRUMENTS) ×3 IMPLANT
PACK CYSTO (CUSTOM PROCEDURE TRAY) ×3 IMPLANT
SET IRRIG Y TYPE TUR BLADDER L (SET/KITS/TRAYS/PACK) ×3 IMPLANT
SHEATH URETERAL 12FRX35CM (MISCELLANEOUS) IMPLANT
TRACTIP FLEXIVA PULS ID 200XHI (Laser) IMPLANT
TRACTIP FLEXIVA PULSE ID 200 (Laser)
TUBING CONNECTING 10 (TUBING) ×2 IMPLANT
TUBING CONNECTING 10' (TUBING) ×1
TUBING UROLOGY SET (TUBING) ×3 IMPLANT

## 2020-05-29 NOTE — Anesthesia Procedure Notes (Signed)
Procedure Name: LMA Insertion Date/Time: 05/29/2020 9:11 AM Performed by: Orest Dikes, CRNA Pre-anesthesia Checklist: Patient identified, Emergency Drugs available, Suction available and Patient being monitored Patient Re-evaluated:Patient Re-evaluated prior to induction Oxygen Delivery Method: Circle system utilized Preoxygenation: Pre-oxygenation with 100% oxygen Induction Type: IV induction LMA: LMA inserted LMA Size: 4.0 Number of attempts: 1 Placement Confirmation: positive ETCO2 Tube secured with: Tape Dental Injury: Teeth and Oropharynx as per pre-operative assessment

## 2020-05-29 NOTE — Op Note (Signed)
Procedure: 1.  Cystoscopy with left retrograde pyelogram and interpretation. 2.  Left ureteroscopy with holmium laser application and stone extraction.   Preop diagnosis: 5 mm left distal ureteral stone.  Postop diagnosis: Same.  Surgeon: Dr. Bjorn Pippin.  Anesthesia: General.  Specimen: Stone fragments.  Drain: None.  EBL: None.  Complications: None.  Indications: The patient is a 29 year old female with a history of stones who was recently found to have a 5 mm left distal ureteral stone.  She has had persistent symptoms and is elected ureteroscopy for management.  Procedure: She was taken operating room where she was given 2 g of Ancef.  A general anesthetic was induced.  She was placed in lithotomy position and fitted with PAS hose.  Her perineum and genitalia were prepped with Betadine solution and she was draped in usual sterile fashion.  Cystoscopy was performed using the 23 [place] scope and 30 degree lens.  Examination revealed a normal urethra.  The bladder wall was smooth and pale without tumors, stones or inflammation.  Ureteral orifices were in their normal anatomic position.  The left ureteral orifice was cannulated with a 5 French opening catheter and contrast was instilled.  Left retrograde pyelogram demonstrated a filling defect in the distal ureter consistent with a stone with some mild dilation proximally.  The 4.5 French single-lumen semirigid ureteroscope was then advanced through the urethra up the left ureteral orifice without difficulty.  The stone was visualized.  A 200 m Moses laser fiber was then used with the settings of 0.2 J and 70 Hz and the stone was then fragmented into dust and manageable fragments.  The fragments were removed with an engage basket.  Final inspection revealed no significant retained ureteral fragments and no mucosal injury or irritation so was not felt that the stent was indicated.  The stone fragments were collected for the patient to  bring to the office for analysis.  She did have additional small stones on CT scan but they were 1 to 2 mm and not felt to merit ureteroscopic management because of their small size.  The bladder was then drained.  She was taken down from lithotomy position, her anesthetic was reversed and she was moved to recovery in stable condition.  There were no complications.

## 2020-05-29 NOTE — Transfer of Care (Signed)
Immediate Anesthesia Transfer of Care Note  Patient: Norma [name]  Procedure(s) Performed: CYSTOSCOPY WITH LEFT  RETROGRADE URETEROSCOPY AND STENT PLACEMENT (Left )  Patient Location: PACU  Anesthesia Type:General  Level of Consciousness: awake, alert  and oriented  Airway & Oxygen Therapy: Patient Spontanous Breathing and Patient connected to face mask oxygen  Post-op Assessment: Report given to RN and Post -op Vital signs reviewed and stable  Post vital signs: Reviewed and stable  Last Vitals:  Vitals Value Taken Time  BP    Temp    Pulse    Resp    SpO2      Last Pain:  Vitals:   05/29/20 0727  TempSrc: Oral  PainSc: 3          Complications: No complications documented.

## 2020-05-29 NOTE — Anesthesia Preprocedure Evaluation (Addendum)
Anesthesia Evaluation  Patient identified by MRN, date of birth, ID band Patient awake    Reviewed: Allergy & Precautions, NPO status , Patient's Chart, lab work & pertinent test results  Airway Mallampati: I  TM Distance: >3 FB Neck ROM: Full    Dental  (+) Teeth Intact, Dental Advisory Given   Pulmonary asthma ,    Pulmonary exam normal        Cardiovascular negative cardio ROS   Rhythm:Regular Rate:Normal     Neuro/Psych PSYCHIATRIC DISORDERS negative neurological ROS     GI/Hepatic negative GI ROS, Neg liver ROS,   Endo/Other  negative endocrine ROS  Renal/GU      Musculoskeletal negative musculoskeletal ROS (+)   Abdominal Normal abdominal exam  (+)   Peds  Hematology negative hematology ROS (+)   Anesthesia Other Findings   Reproductive/Obstetrics                            Anesthesia Physical Anesthesia Plan  ASA: II  Anesthesia Plan: General   Post-op Pain Management:    Induction: Intravenous  PONV Risk Score and Plan: 4 or greater and Ondansetron, Dexamethasone, Midazolam and Scopolamine patch - Pre-op  Airway Management Planned: LMA  Additional Equipment: None  Intra-op Plan:   Post-operative Plan: Extubation in OR  Informed Consent: I have reviewed the patients History and Physical, chart, labs and discussed the procedure including the risks, benefits and alternatives for the proposed anesthesia with the patient or authorized representative who has indicated his/her understanding and acceptance.     Dental advisory given  Plan Discussed with: CRNA  Anesthesia Plan Comments: (Lab Results      Component                Value               Date                      WBC                      8.4                 05/26/2020                HGB                      13.0                05/26/2020                HCT                      38.5                05/26/2020                 MCV                      96.5                05/26/2020                PLT                      198  05/26/2020            Covid-19 Nucleic Acid Test Results Lab Results      Component                Value               Date                      SARSCOV2NAA              NEGATIVE            05/28/2020                SARSCOV2NAA              Not Detected        12/26/2019                SARSCOV2NAA              Not Detected        09/10/2019                SARSCOV2NAA              Not Detected        08/31/2019                SARSCOV2NAA              Not Detected        08/06/2019           )       Anesthesia Quick Evaluation

## 2020-05-29 NOTE — Discharge Instructions (Signed)
[facility] [phone] Post Ureteroscopy With or Without Stent Instructions  Definitions:  Ureter: The duct that transports urine from the kidney to the bladder. Stent:   A plastic hollow tube that is placed into the ureter, from the kidney to the bladder to prevent the ureter from swelling shut. No stent placed.   GENERAL INSTRUCTIONS:  Despite the fact that no skin incisions were used, the area around the ureter and bladder is raw and irritated. The stent is a foreign body which will further irritate the bladder wall. This irritation is manifested by increased frequency of urination, both day and night, and by an increase in the urge to urinate. In some, the urge to urinate is present almost always. Sometimes the urge is strong enough that you may not be able to stop yourself from urinating. The only real cure is to remove the stent and then give time for the bladder wall to heal which can't be done until the danger of the ureter swelling shut has passed, which varies.  You may see some blood in your urine while the stent is in place and a few days afterwards. Do not be alarmed, even if the urine was clear for a while. Get off your feet and drink lots of fluids until clearing occurs. If you start to pass clots or don't improve, call us.  DIET: You may return to your normal diet immediately. Because of the raw surface of your bladder, alcohol, spicy foods, acid type foods and drinks with caffeine may cause irritation or frequency and should be used in moderation. To keep your urine flowing freely and to avoid constipation, drink plenty of fluids during the day ( 8-10 glasses ). Tip: Avoid cranberry juice because it is very acidic.  ACTIVITY: Your physical activity doesn't need to be restricted. However, if you are very active, you may see some blood in your urine. We suggest that you reduce your activity under these circumstances until the bleeding has stopped.  BOWELS: It  is important to keep your bowels regular during the postoperative period. Straining with bowel movements can cause bleeding. A bowel movement every other day is reasonable. Use a mild laxative if needed, such as Milk of Magnesia 2-3 tablespoons, or 2 Dulcolax tablets. Call if you continue to have problems. If you have been taking narcotics for pain, before, during or after your surgery, you may be constipated. Take a laxative if necessary.   MEDICATION: You should resume your pre-surgery medications unless told not to. In addition you will often be given an antibiotic to prevent infection. These should be taken as prescribed until the bottles are finished unless you are having an unusual reaction to one of the drugs.  PROBLEMS YOU SHOULD REPORT TO [place]:  Fevers over 100.5 Fahrenheit.  Heavy bleeding, or clots ( See above notes about blood in urine ).  Inability to urinate.  Drug reactions ( hives, rash, nausea, vomiting, diarrhea ).  Severe burning or pain with urination that is not improving.  FOLLOW-UP: As scheduled on 9/27.   Please bring your stones to the office so we can have them analyzed.     A stent was not placed.

## 2020-05-29 NOTE — Interval H&P Note (Signed)
History and Physical Interval Note:  She continues to have left flank pain.   05/29/2020 8:40 AM  Norma [name]  has presented today for surgery, with the diagnosis of left uretral stone.  The various methods of treatment have been discussed with the patient and family. After consideration of risks, benefits and other options for treatment, the patient has consented to  Procedure(s): CYSTOSCOPY WITH LEFT  RETROGRADE URETEROSCOPY AND STENT PLACEMENT (Left) as a surgical intervention.  The patient's history has been reviewed, patient examined, no change in status, stable for surgery.  I have reviewed the patient's chart and labs.  Questions were answered to the patient's satisfaction.     Bjorn Pippin

## 2020-05-30 ENCOUNTER — Encounter (HOSPITAL_COMMUNITY): Payer: Self-pay | Admitting: Urology

## 2020-06-02 NOTE — Anesthesia Postprocedure Evaluation (Signed)
Anesthesia Post Note  Patient: Norma [name]  Procedure(s) Performed: CYSTOSCOPY WITH LEFT  RETROGRADE URETEROSCOPY AND STENT PLACEMENT (Left )     Patient location during evaluation: PACU Anesthesia Type: General Level of consciousness: awake and alert Pain management: pain level controlled Vital Signs Assessment: post-procedure vital signs reviewed and stable Respiratory status: spontaneous breathing, nonlabored ventilation, respiratory function stable and patient connected to nasal cannula oxygen Cardiovascular status: blood pressure returned to baseline and stable Postop Assessment: no apparent nausea or vomiting Anesthetic complications: no   No complications documented.  Last Vitals:  Vitals:   05/29/20 1015 05/29/20 1026  BP: 126/77 122/88  Pulse: 77 77  Resp: 14 16  Temp: 36.9 C 36.8 C  SpO2: 99% 100%    Last Pain:  Vitals:   05/29/20 1026  TempSrc: Oral  PainSc: 2                  Shelton Silvas

## 2020-06-21 IMAGING — US US RENAL
1 series · 15 of 25 positions shown · non-contrast
Comparison: None.

CLINICAL DATA: Right lower quadrant abdomen pain for 2 days.

EXAM:
RENAL / URINARY TRACT ULTRASOUND COMPLETE

[Series 1: us renal · 15 of 50 slices shown]
[im 1/50]
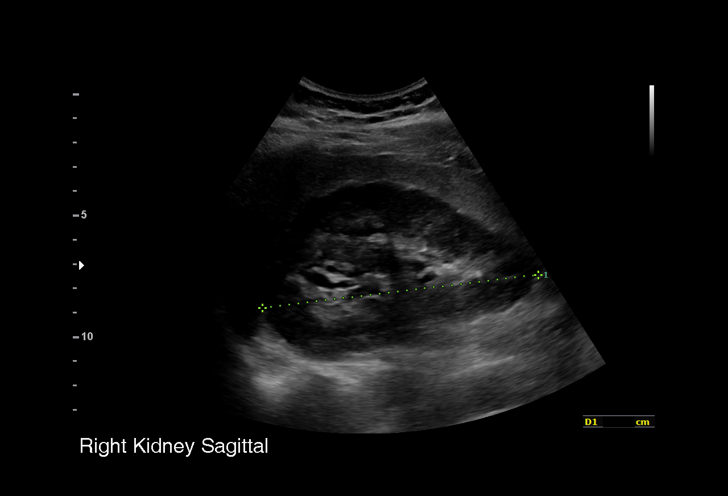
[im 5/50]
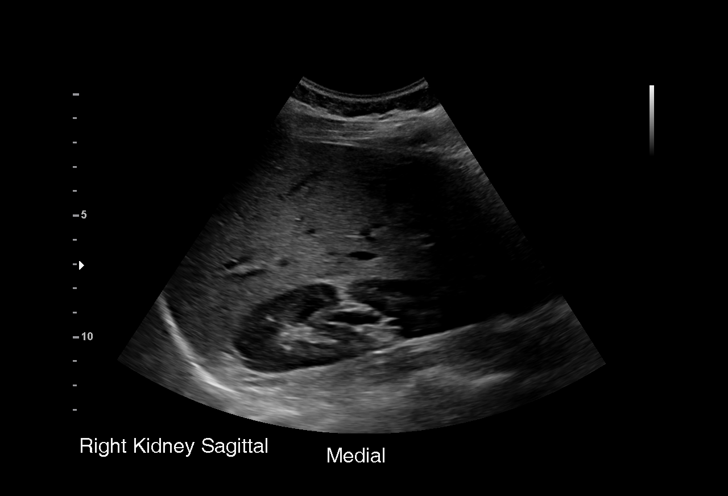
[im 9/50]
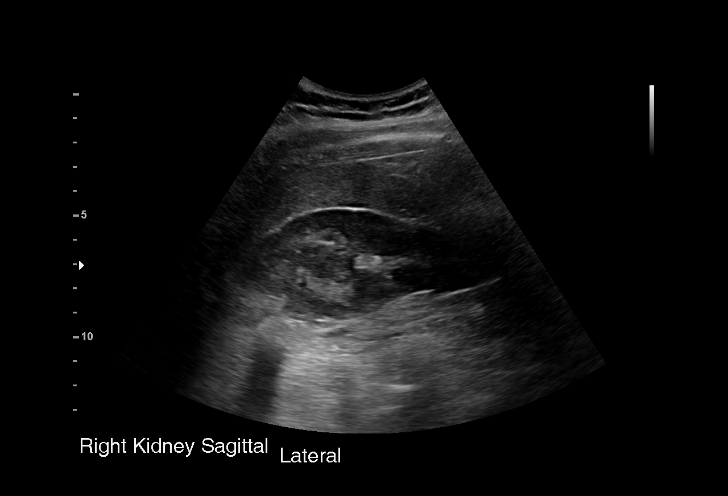
[im 11/50]
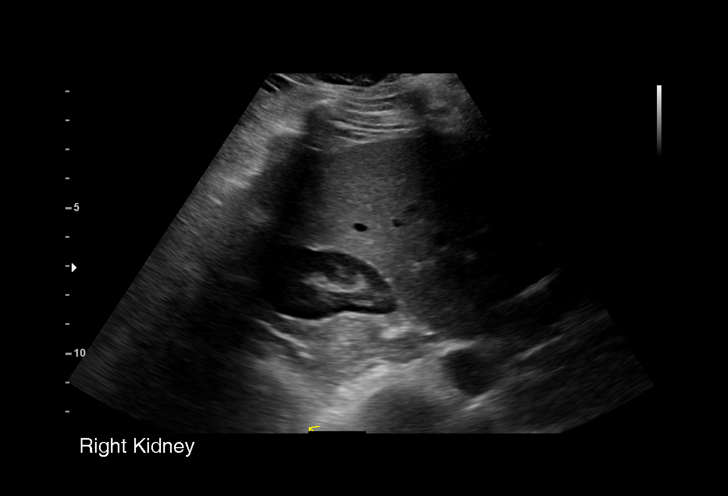
[im 15/50]
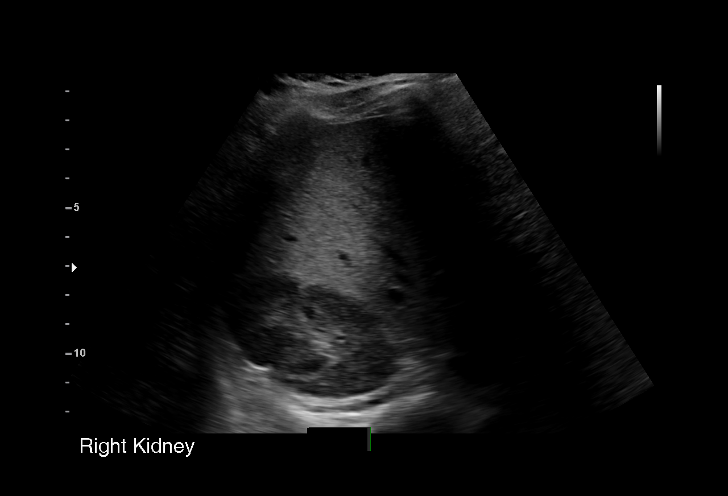
[im 19/50]
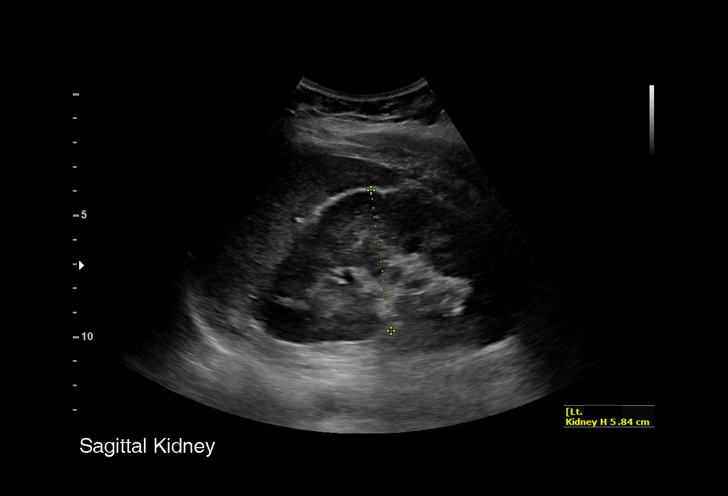
[im 21/50]
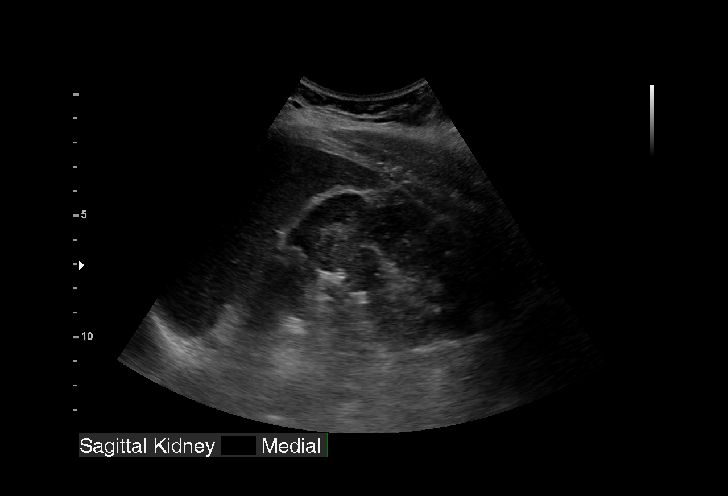
[im 25/50]
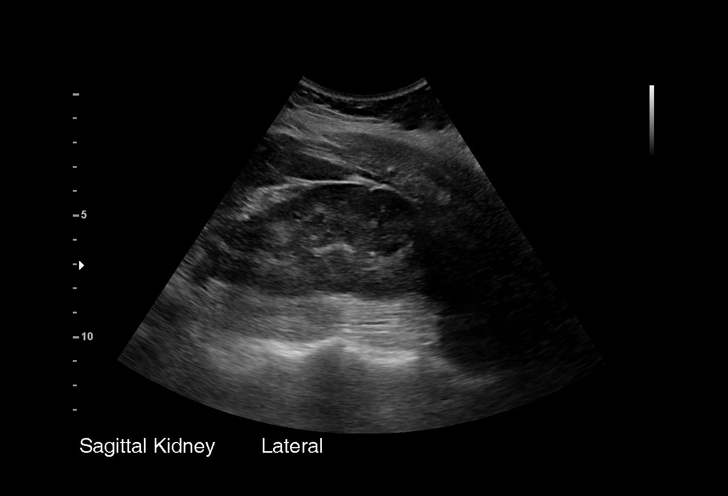
[im 29/50]
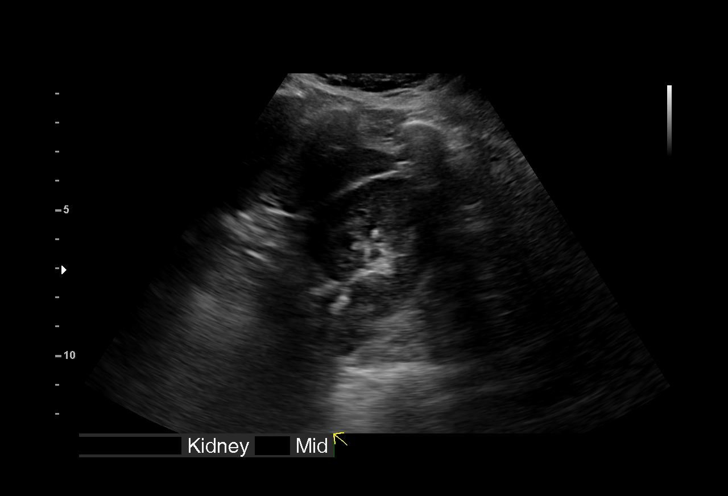
[im 31/50]
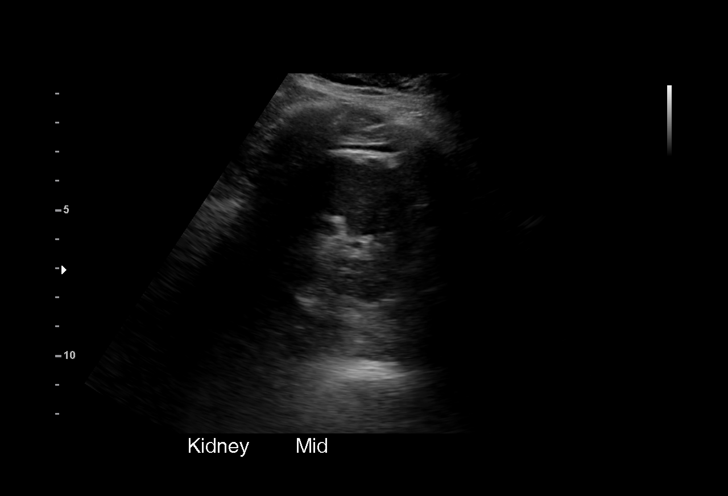
[im 35/50]
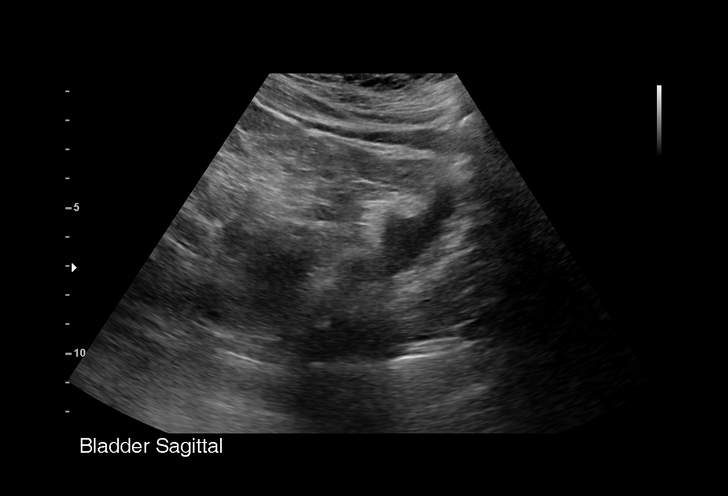
[im 39/50]
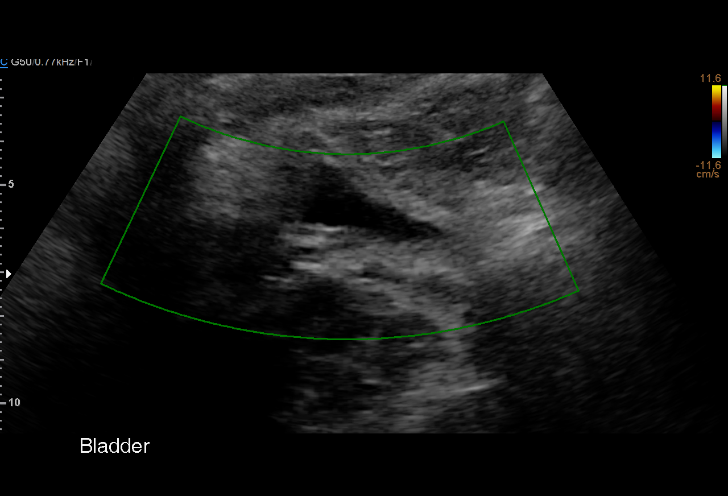
[im 41/50]
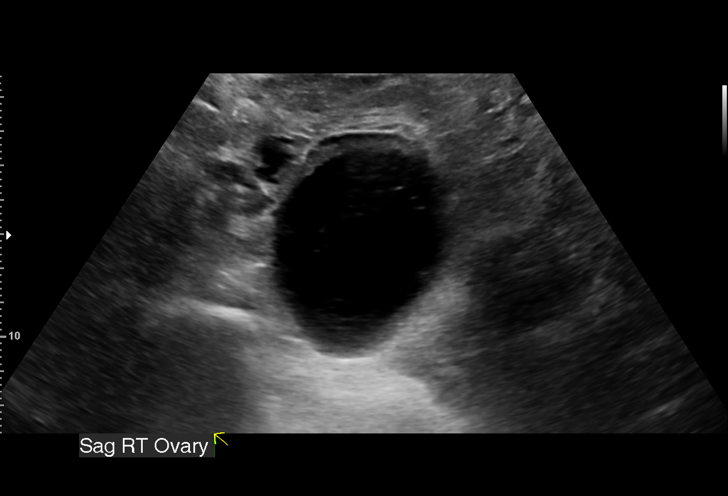
[im 45/50]
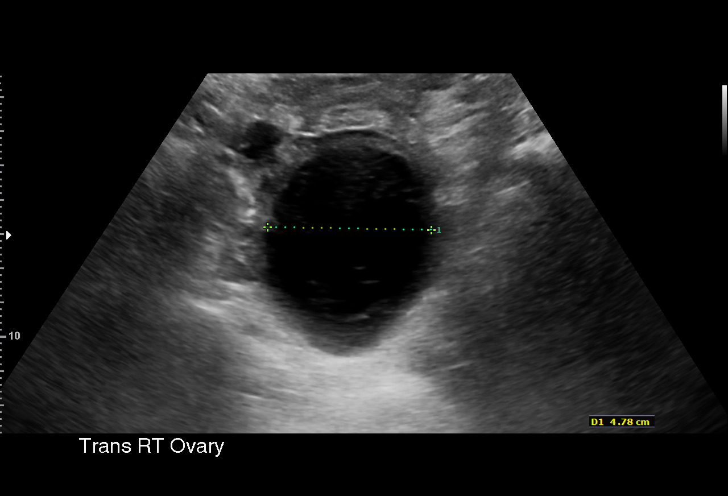
[im 50/50]
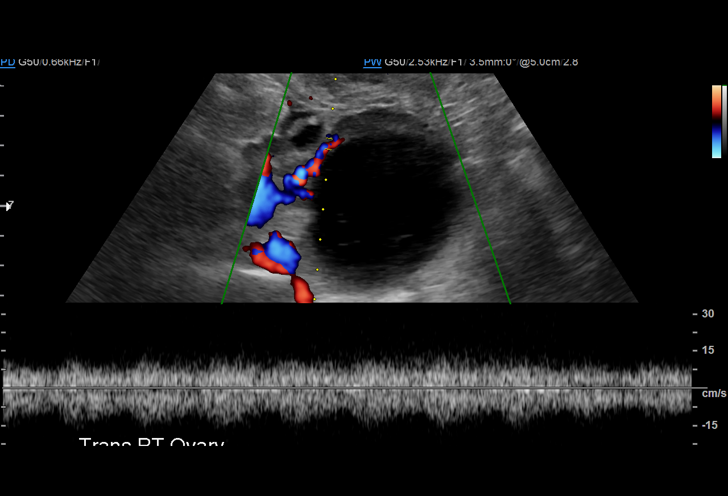

[15 of 25 positions shown; findings below may reference images not displayed]

FINDINGS: Right Kidney:

Renal measurements: 11.9 x 4.4 x 4.9 cm = volume: 132.6 mL .
Echogenicity within normal limits. No mass or hydronephrosis
visualized.

Left Kidney:

Renal measurements: 11.9 x 5.8 x 4.9 cm = volume: 177.8 mL.
Echogenicity within normal limits. No mass or hydronephrosis
visualized.

Bladder:

Limited evaluation as the bladder is nondistended.

There is incidental finding a complex right ovarian cyst measuring
6.1 x 5.1 x 4.8 cm.
IMPRESSION: Normal kidneys.

Complex right ovarian cyst measuring 6.1 x 5.1 x 4.8 cm.

## 2020-07-02 ENCOUNTER — Encounter: Payer: Self-pay | Admitting: Psychiatry

## 2020-07-03 ENCOUNTER — Other Ambulatory Visit: Payer: Self-pay

## 2020-07-03 ENCOUNTER — Ambulatory Visit (INDEPENDENT_AMBULATORY_CARE_PROVIDER_SITE_OTHER): Payer: BC Managed Care – PPO | Admitting: Adult Health

## 2020-07-03 ENCOUNTER — Encounter: Payer: Self-pay | Admitting: Adult Health

## 2020-07-03 DIAGNOSIS — F909 Attention-deficit hyperactivity disorder, unspecified type: Secondary | ICD-10-CM

## 2020-07-03 DIAGNOSIS — F411 Generalized anxiety disorder: Secondary | ICD-10-CM | POA: Diagnosis not present

## 2020-07-03 MED ORDER — LISDEXAMFETAMINE DIMESYLATE 60 MG PO CAPS: 60.0000 mg | ORAL_CAPSULE | Freq: Every day | ORAL | 0 refills | Status: DC

## 2020-07-03 NOTE — Progress Notes (Signed)
Norma [name] [id number] Jan 05, 1991 29 y.o.  Subjective:   Patient ID:  Norma [name] is a 29 y.o. (DOB 10-24-1990) female.  Chief Complaint: No chief complaint on file.   HPI [name] presents to the office today for follow-up of GAD and ADHD.  Describes mood today as "ok". Pleasant. Mood symptoms - denies depression, anxiety, and irritability. Stating "I'm doing good". Has finished school and passed counseling exam. 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 Feels like Vyvanse is helpful, but not lasting throughout her day. She and family doing well. Son in kindergarten. Stable interest and motivation. Taking medications as prescribed.  Energy levels stable. Active, has a regular exercise routine.  Enjoys some usual interests and activities. Married. Lives with husband of 8 years and 3 children - 5-s, 3-d, 2-s - out of school for the summer. Husband working 3rd shift. Husbands family local. She has family in [place]. Spending time with family.  Appetite adequate. Weight stable 165 pounds. Sleeps well most nights. Averages 7 to 8 hours.   Focus and concentration improved with increase in Vyvanse. Completing tasks. Managing aspects of household. Metallurgist. Works part time.  Denies SI or HI. Denies AH or VH.  Review of Systems:  Review of Systems  Musculoskeletal: Negative for gait problem.  Neurological: Negative for tremors.  Psychiatric/Behavioral:       Please refer to HPI    Medications: I have reviewed the patient's current medications.  Current Outpatient Medications  Medication Sig Dispense Refill  . acetaminophen (TYLENOL) 500 MG tablet Take 2 tablets (1,000 mg total) by mouth every 6 (six) hours. 30 tablet 0  . albuterol (PROVENTIL HFA;VENTOLIN HFA) 108 (90 Base) MCG/ACT inhaler Inhale 2 puffs into the lungs every 6 (six) hours as needed for wheezing or shortness of breath.    Marland Kitchen FLUoxetine (PROZAC) 20 MG capsule Take one capsule daily. (Patient taking  differently: Take 20 mg by mouth daily. ) 30 capsule 5  . lisdexamfetamine (VYVANSE) 60 MG capsule Take 1 capsule (60 mg total) by mouth daily. 30 capsule 0  . ondansetron (ZOFRAN ODT) 4 MG disintegrating tablet Take 1 tablet (4 mg total) by mouth every 8 (eight) hours as needed for nausea. 10 tablet 0  . oxyCODONE-acetaminophen (PERCOCET) 5-325 MG tablet Take 1 tablet by mouth every 4 (four) hours as needed. (Patient taking differently: Take 1 tablet by mouth every 4 (four) hours as needed for moderate pain or severe pain. ) 20 tablet 0  . rizatriptan (MAXALT-MLT) 10 MG disintegrating tablet Take 1 tablet (10 mg total) by mouth as needed for migraine. May repeat in 2 hours if needed (Patient not taking: Reported on 05/28/2020) 10 tablet 0  . SUMAtriptan (IMITREX) 50 MG tablet Take 1 tablet (50 mg total) by mouth once for 1 dose. May repeat in 2 hours if headache persists or recurs. (Patient not taking: Reported on 05/28/2020) 10 tablet 1  . tamsulosin (FLOMAX) 0.4 MG CAPS capsule Take 1 capsule (0.4 mg total) by mouth daily after supper. (Patient taking differently: Take 0.4 mg by mouth at bedtime. ) 10 capsule 0   No current facility-administered medications for this visit.    Medication Side Effects: None  Allergies: No Known Allergies  Past Medical History:  Diagnosis Date  . Anemia   . Asthma    exercise induced  . Chronic allergic rhinitis 05/07/2019  . Dysmenorrhea   . Right ureteral stone     Family History  Problem Relation Age  of Onset  . Healthy Daughter     Social History   Socioeconomic History  . Marital status: Married    Spouse name: Not on file  . Number of children: 3  . Years of education: Not on file  . Highest education level: Not on file  Occupational History  . Occupation: stay at home mom  Tobacco Use  . Smoking status: Never Smoker  . Smokeless tobacco: Never Used  Vaping Use  . Vaping Use: Never used  Substance and Sexual Activity  . Alcohol use:  Yes    Comment: occasional when not pregnant  . Drug use: No  . Sexual activity: Not Currently    Birth control/protection: None, Surgical  Other Topics Concern  . Not on file  Social History Narrative  . Not on file   Social Determinants of Health   Financial Resource Strain:   . Difficulty of Paying Living Expenses: Not on file  Food Insecurity:   . Worried About Programme researcher, broadcasting/film/video in the Last Year: Not on file  . Ran Out of Food in the Last Year: Not on file  Transportation Needs:   . Lack of Transportation (Medical): Not on file  . Lack of Transportation (Non-Medical): Not on file  Physical Activity:   . Days of Exercise per Week: Not on file  . Minutes of Exercise per Session: Not on file  Stress:   . Feeling of Stress : Not on file  Social Connections:   . Frequency of Communication with Friends and Family: Not on file  . Frequency of Social Gatherings with Friends and Family: Not on file  . Attends Religious Services: Not on file  . Active Member of Clubs or Organizations: Not on file  . Attends Banker Meetings: Not on file  . Marital Status: Not on file  Intimate Partner Violence:   . Fear of Current or Ex-Partner: Not on file  . Emotionally Abused: Not on file  . Physically Abused: Not on file  . Sexually Abused: Not on file    Past Medical History, Surgical history, Social history, and Family history were reviewed and updated as appropriate.   Please see review of systems for further details on the patient's review from today.   Objective:   Physical Exam:  LMP 09/02/2018 (Exact Date)   Physical Exam Constitutional:      General: She is not in acute distress. Musculoskeletal:        General: No deformity.  Neurological:     Mental Status: She is alert and oriented to person, place, and time.     Coordination: Coordination normal.  Psychiatric:        Attention and Perception: Attention and perception normal. She does not perceive  auditory or visual hallucinations.        Mood and Affect: Mood normal. Mood is not anxious or depressed. Affect is not labile, blunt, angry or inappropriate.        Speech: Speech normal.        Behavior: Behavior normal.        Thought Content: Thought content normal. Thought content is not paranoid or delusional. Thought content does not include homicidal or suicidal ideation. Thought content does not include homicidal or suicidal plan.        Cognition and Memory: Cognition and memory normal.        Judgment: Judgment normal.     Comments: Insight intact     Lab Review:  Component Value Date/Time   NA 139 05/26/2020 0338   K 3.5 05/26/2020 0338   CL 105 05/26/2020 0338   CO2 23 05/26/2020 0338   GLUCOSE 123 (H) 05/26/2020 0338   BUN 13 05/26/2020 0338   CREATININE 0.65 05/26/2020 0338   CALCIUM 9.9 05/26/2020 0338   PROT 7.1 05/26/2020 0338   ALBUMIN 4.2 05/26/2020 0338   AST 17 05/26/2020 0338   ALT 18 05/26/2020 0338   ALKPHOS 65 05/26/2020 0338   BILITOT 0.5 05/26/2020 0338   GFRNONAA >60 05/26/2020 0338   GFRAA >60 05/26/2020 0338       Component Value Date/Time   WBC 8.4 05/26/2020 0338   RBC 3.99 05/26/2020 0338   HGB 13.0 05/26/2020 0338   HCT 38.5 05/26/2020 0338   PLT 198 05/26/2020 0338   MCV 96.5 05/26/2020 0338   MCH 32.6 05/26/2020 0338   MCHC 33.8 05/26/2020 0338   RDW 12.2 05/26/2020 0338   LYMPHSABS 2.1 03/18/2014 1218   MONOABS 0.3 03/18/2014 1218   EOSABS 0.2 03/18/2014 1218   BASOSABS 0.0 03/18/2014 1218    No results found for: POCLITH, LITHIUM   No results found for: PHENYTOIN, PHENOBARB, VALPROATE, CBMZ   .res Assessment: Plan:    Plan:  PDMP reviewed  1. Vyvanse 50mg  to 60mg  daily 2. Prozac 20mg  daily  BP - 124/81/87  RTC 3 months  Patient advised to contact office with any questions, adverse effects, or acute worsening in signs and symptoms.  Discussed potential benefits, risks, and side effects of stimulants with  patient to include increased heart rate, palpitations, insomnia, increased anxiety, inceased irritability, or decreased appetite.  Instructed patient to contact office if experiencing any significant tolerability issues.   Diagnoses and all orders for this visit:  Attention deficit hyperactivity disorder (ADHD), unspecified ADHD type -     lisdexamfetamine (VYVANSE) 60 MG capsule; Take 1 capsule (60 mg total) by mouth daily.  Generalized anxiety disorder     Please see After Visit Summary for patient specific instructions.  No future appointments.  No orders of the defined types were placed in this encounter.   ------------------------------

## 2020-07-18 ENCOUNTER — Other Ambulatory Visit: Payer: Self-pay

## 2020-07-18 ENCOUNTER — Telehealth: Payer: Self-pay | Admitting: Adult Health

## 2020-07-18 ENCOUNTER — Other Ambulatory Visit: Payer: Self-pay | Admitting: Adult Health

## 2020-07-18 DIAGNOSIS — F909 Attention-deficit hyperactivity disorder, unspecified type: Secondary | ICD-10-CM

## 2020-07-18 MED ORDER — LISDEXAMFETAMINE DIMESYLATE 60 MG PO CAPS: 60.0000 mg | ORAL_CAPSULE | Freq: Every day | ORAL | 0 refills | Status: DC

## 2020-07-18 NOTE — Telephone Encounter (Signed)
Pended Rx again for [facility] to send

## 2020-07-18 NOTE — Telephone Encounter (Signed)
[facility] [address] called stating they don't have an RX for her Vyvanse. Please call in another refill to them.

## 2020-08-13 ENCOUNTER — Telehealth: Payer: Self-pay

## 2020-08-13 MED ORDER — RIZATRIPTAN BENZOATE 10 MG PO TBDP: 10.0000 mg | ORAL_TABLET | ORAL | 0 refills | Status: DC | PRN

## 2020-08-13 NOTE — Telephone Encounter (Signed)
maxalt ordered.  F/u here if needed.

## 2020-08-13 NOTE — Telephone Encounter (Signed)
Patient is expericing a severe migraine today and had ran out of this medication to help with them, patient is requesting refill, Please advise if she needs appt.  LAST APPOINTMENT DATE: Visit date not found   NEXT APPOINTMENT DATE:@Visit  date not found  MEDICATION:rizatriptan (MAXALT-MLT) 10 MG disintegrating tablet  PHARMACY:[id number] - [place], [place] - [address] [place] [address] [place] [address]  **Let patient know to contact pharmacy at the end of the day to make sure medication is ready. **  ** Please notify patient to allow 48-72 hours to process**  **Encourage patient to contact the pharmacy for refills or they can request refills through [facility]**  CLINICAL FILLS OUT ALL BELOW:   LAST REFILL:  QTY:  REFILL DATE:    OTHER COMMENTS:    Okay for refill?  Please advise

## 2020-08-13 NOTE — Telephone Encounter (Signed)
Patient aware that PCP sent in medications, f/u if needed

## 2020-08-20 ENCOUNTER — Telehealth: Payer: Self-pay | Admitting: Adult Health

## 2020-08-20 NOTE — Telephone Encounter (Signed)
Pt called to report on new dose 60 mg Vyvanse. Working well and asking for Rx to go to [facility]. Apt 1/21

## 2020-08-21 ENCOUNTER — Telehealth: Payer: Self-pay | Admitting: Adult Health

## 2020-08-21 ENCOUNTER — Other Ambulatory Visit: Payer: Self-pay

## 2020-08-21 DIAGNOSIS — F909 Attention-deficit hyperactivity disorder, unspecified type: Secondary | ICD-10-CM

## 2020-08-21 NOTE — Telephone Encounter (Signed)
Pt wants to know if she can get her rx for Vyvanse 60mg  [facility] [place] said that they told her their system went down and they lost the rx.

## 2020-08-21 NOTE — Telephone Encounter (Signed)
Rx for Norma [name] to send Vyvanse 60 mg

## 2020-08-22 MED ORDER — LISDEXAMFETAMINE DIMESYLATE 60 MG PO CAPS: 60.0000 mg | ORAL_CAPSULE | Freq: Every day | ORAL | 0 refills | Status: DC

## 2020-09-16 ENCOUNTER — Other Ambulatory Visit: Payer: Self-pay | Admitting: Adult Health

## 2020-09-16 DIAGNOSIS — F411 Generalized anxiety disorder: Secondary | ICD-10-CM

## 2020-09-18 ENCOUNTER — Other Ambulatory Visit: Payer: Self-pay | Admitting: Adult Health

## 2020-09-18 ENCOUNTER — Telehealth: Payer: Self-pay | Admitting: Adult Health

## 2020-09-18 DIAGNOSIS — F909 Attention-deficit hyperactivity disorder, unspecified type: Secondary | ICD-10-CM

## 2020-09-18 MED ORDER — AMPHETAMINE-DEXTROAMPHET ER 30 MG PO CP24: 30.0000 mg | ORAL_CAPSULE | Freq: Every day | ORAL | 0 refills | Status: DC

## 2020-09-18 NOTE — Telephone Encounter (Signed)
Sent in Adderall Xr30mg  for 30 days.

## 2020-09-18 NOTE — Telephone Encounter (Signed)
Pt  Called and said that she is in the laspe in insurance and she can't to afford her vyvanse it is over 300 dollars. She would like either adderall or ritalin because it is cheaper. She only needs it only for the month of January because she will have insurance in February. [facility] [place]

## 2020-10-03 ENCOUNTER — Ambulatory Visit: Payer: BC Managed Care – PPO | Admitting: Adult Health

## 2020-10-24 ENCOUNTER — Other Ambulatory Visit: Payer: Self-pay | Admitting: Adult Health

## 2020-10-24 ENCOUNTER — Telehealth: Payer: Self-pay | Admitting: Adult Health

## 2020-10-24 DIAGNOSIS — F411 Generalized anxiety disorder: Secondary | ICD-10-CM

## 2020-10-24 DIAGNOSIS — F909 Attention-deficit hyperactivity disorder, unspecified type: Secondary | ICD-10-CM

## 2020-10-24 MED ORDER — FLUOXETINE HCL 20 MG PO CAPS: ORAL_CAPSULE | ORAL | 2 refills | Status: DC

## 2020-10-24 MED ORDER — LISDEXAMFETAMINE DIMESYLATE 60 MG PO CAPS: 60.0000 mg | ORAL_CAPSULE | Freq: Every day | ORAL | 0 refills | Status: DC

## 2020-10-24 NOTE — Telephone Encounter (Signed)
Script sent  

## 2020-10-24 NOTE — Telephone Encounter (Signed)
Patient called for refill for medications Prozac and Adderal. Made appt for 2/25. Was no show for 1/6. [facility] [address].

## 2020-10-27 ENCOUNTER — Telehealth: Payer: Self-pay | Admitting: Adult Health

## 2020-10-27 ENCOUNTER — Other Ambulatory Visit: Payer: Self-pay | Admitting: Adult Health

## 2020-10-27 DIAGNOSIS — F909 Attention-deficit hyperactivity disorder, unspecified type: Secondary | ICD-10-CM

## 2020-10-27 MED ORDER — AMPHETAMINE-DEXTROAMPHET ER 30 MG PO CP24: 30.0000 mg | ORAL_CAPSULE | Freq: Every day | ORAL | 0 refills | Status: DC

## 2020-10-27 NOTE — Telephone Encounter (Signed)
Next appt is 11/07/20. Atziry called and said that her Adderall 30 mg wasn't called in to [facility] on [address] [phone], [phone].The Prozac and Vyvanse were. Could she get her Adderall called in please.

## 2020-10-27 NOTE — Telephone Encounter (Signed)
Script sent  

## 2020-11-07 ENCOUNTER — Ambulatory Visit: Payer: Self-pay | Admitting: Adult Health

## 2020-12-05 ENCOUNTER — Ambulatory Visit: Payer: Self-pay | Admitting: Adult Health

## 2020-12-09 ENCOUNTER — Telehealth: Payer: Self-pay | Admitting: Adult Health

## 2020-12-09 ENCOUNTER — Other Ambulatory Visit: Payer: Self-pay

## 2020-12-09 DIAGNOSIS — F909 Attention-deficit hyperactivity disorder, unspecified type: Secondary | ICD-10-CM

## 2020-12-09 MED ORDER — AMPHETAMINE-DEXTROAMPHET ER 30 MG PO CP24: 30.0000 mg | ORAL_CAPSULE | Freq: Every day | ORAL | 0 refills | Status: DC

## 2020-12-09 NOTE — Telephone Encounter (Signed)
Last refill 10/27/20 Pended for Almira Coaster to review and send

## 2020-12-09 NOTE — Telephone Encounter (Signed)
Pt would like a refill on Adderall. Pt said that she had her appts mixed up and that's why she missed the last one. Please send to [facility] [address].

## 2020-12-09 NOTE — Telephone Encounter (Signed)
Script sent  

## 2021-01-05 ENCOUNTER — Ambulatory Visit: Payer: Self-pay | Admitting: Adult Health

## 2021-01-13 ENCOUNTER — Other Ambulatory Visit: Payer: Self-pay

## 2021-01-13 ENCOUNTER — Telehealth: Payer: Self-pay | Admitting: Adult Health

## 2021-01-13 DIAGNOSIS — F909 Attention-deficit hyperactivity disorder, unspecified type: Secondary | ICD-10-CM

## 2021-01-13 MED ORDER — AMPHETAMINE-DEXTROAMPHET ER 30 MG PO CP24: 30.0000 mg | ORAL_CAPSULE | Freq: Every day | ORAL | 0 refills | Status: DC

## 2021-01-13 NOTE — Telephone Encounter (Signed)
Pended.

## 2021-01-13 NOTE — Telephone Encounter (Signed)
Pt would like a refill on Adderall xr 30mg . Please send to [address]

## 2021-01-13 NOTE — Telephone Encounter (Signed)
Script sent  

## 2021-01-29 ENCOUNTER — Ambulatory Visit: Payer: Self-pay | Admitting: Adult Health

## 2021-02-10 ENCOUNTER — Telehealth: Payer: Self-pay | Admitting: Adult Health

## 2021-02-10 NOTE — Telephone Encounter (Signed)
Pt called requesting Rx for Adderall @ [facility] [address]. Last apt 07/03/20. 4 no shows. 1 canc. Contact # [phone]

## 2021-02-11 NOTE — Telephone Encounter (Signed)
She needs a appt

## 2021-02-12 ENCOUNTER — Other Ambulatory Visit: Payer: Self-pay

## 2021-02-12 DIAGNOSIS — F909 Attention-deficit hyperactivity disorder, unspecified type: Secondary | ICD-10-CM

## 2021-02-12 NOTE — Telephone Encounter (Signed)
Last refill 5/03 Pended for Almira Coaster to send

## 2021-02-12 NOTE — Telephone Encounter (Signed)
Pt made appt for 02/18/21 @ 12. Please sen in RX for Adderall @ [facility] on [address].

## 2021-02-13 MED ORDER — AMPHETAMINE-DEXTROAMPHET ER 30 MG PO CP24: 30.0000 mg | ORAL_CAPSULE | Freq: Every day | ORAL | 0 refills | Status: DC

## 2021-02-18 ENCOUNTER — Encounter: Payer: Self-pay | Admitting: Adult Health

## 2021-02-18 ENCOUNTER — Telehealth (INDEPENDENT_AMBULATORY_CARE_PROVIDER_SITE_OTHER): Payer: Self-pay | Admitting: Adult Health

## 2021-02-18 DIAGNOSIS — F909 Attention-deficit hyperactivity disorder, unspecified type: Secondary | ICD-10-CM

## 2021-02-18 DIAGNOSIS — F411 Generalized anxiety disorder: Secondary | ICD-10-CM

## 2021-02-18 MED ORDER — AMPHETAMINE-DEXTROAMPHETAMINE 20 MG PO TABS: 20.0000 mg | ORAL_TABLET | Freq: Every day | ORAL | 0 refills | Status: DC

## 2021-02-18 MED ORDER — AMPHETAMINE-DEXTROAMPHET ER 30 MG PO CP24: 30.0000 mg | ORAL_CAPSULE | Freq: Every day | ORAL | 0 refills | Status: DC

## 2021-02-18 MED ORDER — FLUOXETINE HCL 20 MG PO CAPS: ORAL_CAPSULE | ORAL | 3 refills | Status: DC

## 2021-02-18 NOTE — Progress Notes (Signed)
Norma [name] [id number] Nov 05, 1990 29 y.o.  Subjective:   Patient ID:  Norma [name] is a 30 y.o. (DOB [birth year]) female.  Chief Complaint: No chief complaint on file.   HPI Norma [name] presents to the office today for follow-up of GAD and ADHD.  Describes mood today as "ok". Pleasant. Mood symptoms - denies depression, anxiety, and irritability. Stating "I'm doing ok". Stating "my moods are consistent". Family doing well. Working as a Paramedic. Feels like medications are helpful.    Has finished school and passed counseling exam. 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 Feels like Vyvanse is helpful, but not lasting throughout her day. She and family doing well. Son in kindergarten. Stable interest and motivation. Taking medications as prescribed.  Energy levels stable. Active, has a regular exercise routine.  Enjoys some usual interests and activities. Married. Lives with husband and 3 children. Husband staying at home with children. Husband's family local. She has family in [place]. Spending time with family.  Appetite adequate. Weight gain 167 pounds. Sleeps well most nights. Averages 7 to 8 hours.   Focus and concentration improved - has switched from Vyvanse to Adderall XR 30. Does not feel like medication lasts throughout the day. Completing tasks. Managing aspects of household. Metallurgist. Works part time.  Denies SI or HI.  Denies AH or VH.   Review of Systems:  Review of Systems  Musculoskeletal: Negative for gait problem.  Neurological: Negative for tremors.  Psychiatric/Behavioral:       Please refer to HPI    Medications: I have reviewed the patient's current medications.  Current Outpatient Medications  Medication Sig Dispense Refill  . acetaminophen (TYLENOL) 500 MG tablet Take 2 tablets (1,000 mg total) by mouth every 6 (six) hours. 30 tablet 0  . albuterol (PROVENTIL HFA;VENTOLIN HFA) 108 (90 Base) MCG/ACT inhaler Inhale 2 puffs into the  lungs every 6 (six) hours as needed for wheezing or shortness of breath.    . amphetamine-dextroamphetamine (ADDERALL XR) 30 MG 24 hr capsule Take 1 capsule (30 mg total) by mouth daily. 30 capsule 0  . FLUoxetine (PROZAC) 20 MG capsule TAKE 1 CAPSULE BY MOUTH EVERY DAY 90 capsule 2  . lisdexamfetamine (VYVANSE) 60 MG capsule Take 1 capsule (60 mg total) by mouth daily. 30 capsule 0  . ondansetron (ZOFRAN ODT) 4 MG disintegrating tablet Take 1 tablet (4 mg total) by mouth every 8 (eight) hours as needed for nausea. 10 tablet 0  . oxyCODONE-acetaminophen (PERCOCET) 5-325 MG tablet Take 1 tablet by mouth every 4 (four) hours as needed. (Patient taking differently: Take 1 tablet by mouth every 4 (four) hours as needed for moderate pain or severe pain. ) 20 tablet 0  . rizatriptan (MAXALT-MLT) 10 MG disintegrating tablet Take 1 tablet (10 mg total) by mouth as needed for migraine. May repeat in 2 hours if needed 10 tablet 0  . SUMAtriptan (IMITREX) 50 MG tablet Take 1 tablet (50 mg total) by mouth once for 1 dose. May repeat in 2 hours if headache persists or recurs. (Patient not taking: Reported on 05/28/2020) 10 tablet 1  . tamsulosin (FLOMAX) 0.4 MG CAPS capsule Take 1 capsule (0.4 mg total) by mouth daily after supper. (Patient taking differently: Take 0.4 mg by mouth at bedtime. ) 10 capsule 0   No current facility-administered medications for this visit.    Medication Side Effects: None  Allergies: No Known Allergies  Past Medical History:  Diagnosis Date  . Anemia   .  Asthma    exercise induced  . Chronic allergic rhinitis 05/07/2019  . Dysmenorrhea   . Right ureteral stone     Past Medical History, Surgical history, Social history, and Family history were reviewed and updated as appropriate.   Please see review of systems for further details on the patient's review from today.   Objective:   Physical Exam:  LMP 09/02/2018 (Exact Date)   Physical Exam Constitutional:       General: She is not in acute distress. Musculoskeletal:        General: No deformity.  Neurological:     Mental Status: She is alert and oriented to person, place, and time.     Coordination: Coordination normal.  Psychiatric:        Attention and Perception: Attention and perception normal. She does not perceive auditory or visual hallucinations.        Mood and Affect: Mood normal. Mood is not anxious or depressed. Affect is not labile, blunt, angry or inappropriate.        Speech: Speech normal.        Behavior: Behavior normal.        Thought Content: Thought content normal. Thought content is not paranoid or delusional. Thought content does not include homicidal or suicidal ideation. Thought content does not include homicidal or suicidal plan.        Cognition and Memory: Cognition and memory normal.        Judgment: Judgment normal.     Comments: Insight intact     Lab Review:     Component Value Date/Time   NA 139 05/26/2020 0338   K 3.5 05/26/2020 0338   CL 105 05/26/2020 0338   CO2 23 05/26/2020 0338   GLUCOSE 123 (H) 05/26/2020 0338   BUN 13 05/26/2020 0338   CREATININE 0.65 05/26/2020 0338   CALCIUM 9.9 05/26/2020 0338   PROT 7.1 05/26/2020 0338   ALBUMIN 4.2 05/26/2020 0338   AST 17 05/26/2020 0338   ALT 18 05/26/2020 0338   ALKPHOS 65 05/26/2020 0338   BILITOT 0.5 05/26/2020 0338   GFRNONAA >60 05/26/2020 0338   GFRAA >60 05/26/2020 0338       Component Value Date/Time   WBC 8.4 05/26/2020 0338   RBC 3.99 05/26/2020 0338   HGB 13.0 05/26/2020 0338   HCT 38.5 05/26/2020 0338   PLT 198 05/26/2020 0338   MCV 96.5 05/26/2020 0338   MCH 32.6 05/26/2020 0338   MCHC 33.8 05/26/2020 0338   RDW 12.2 05/26/2020 0338   LYMPHSABS 2.1 03/18/2014 1218   MONOABS 0.3 03/18/2014 1218   EOSABS 0.2 03/18/2014 1218   BASOSABS 0.0 03/18/2014 1218    No results found for: POCLITH, LITHIUM   No results found for: PHENYTOIN, PHENOBARB, VALPROATE, CBMZ    .res Assessment: Plan:     Plan:  PDMP reviewed  1. Adderall XR 30 daily  2. Prozac 20mg  daily 3. Adderall 20mg  daily  BP WNL limits per patient.  RTC 3 months  Patient advised to contact office with any questions, adverse effects, or acute worsening in signs and symptoms.  Discussed potential benefits, risks, and side effects of stimulants with patient to include increased heart rate, palpitations, insomnia, increased anxiety, inceased irritability, or decreased appetite.  Instructed patient to contact office if experiencing any significant tolerability issues.   There are no diagnoses linked to this encounter.   Please see After Visit Summary for patient specific instructions.  No future appointments.  No orders  of the defined types were placed in this encounter.   -------------------------------

## 2021-05-06 ENCOUNTER — Other Ambulatory Visit: Payer: Self-pay

## 2021-05-06 ENCOUNTER — Telehealth: Payer: Self-pay | Admitting: Adult Health

## 2021-05-06 DIAGNOSIS — F909 Attention-deficit hyperactivity disorder, unspecified type: Secondary | ICD-10-CM

## 2021-05-06 NOTE — Telephone Encounter (Signed)
[facility] called  requesting a refill on her Adderall 20 and 30 mg. Rolly Salter checked [employer] and they do have both mg in stock. Call to:  [facility] [id number] [place], [place] - [address]  Phone:  [phone]  Fax:  [phone]

## 2021-05-06 NOTE — Telephone Encounter (Signed)
Pended.

## 2021-05-07 MED ORDER — AMPHETAMINE-DEXTROAMPHETAMINE 20 MG PO TABS: 20.0000 mg | ORAL_TABLET | Freq: Every day | ORAL | 0 refills | Status: DC

## 2021-05-07 MED ORDER — AMPHETAMINE-DEXTROAMPHET ER 30 MG PO CP24: 30.0000 mg | ORAL_CAPSULE | Freq: Every day | ORAL | 0 refills | Status: DC

## 2021-06-22 ENCOUNTER — Telehealth: Payer: Self-pay | Admitting: Adult Health

## 2021-06-22 ENCOUNTER — Other Ambulatory Visit: Payer: Self-pay

## 2021-06-22 DIAGNOSIS — F909 Attention-deficit hyperactivity disorder, unspecified type: Secondary | ICD-10-CM

## 2021-06-22 MED ORDER — AMPHETAMINE-DEXTROAMPHET ER 30 MG PO CP24: 30.0000 mg | ORAL_CAPSULE | Freq: Every day | ORAL | 0 refills | Status: DC

## 2021-06-22 MED ORDER — AMPHETAMINE-DEXTROAMPHETAMINE 20 MG PO TABS: 20.0000 mg | ORAL_TABLET | Freq: Every day | ORAL | 0 refills | Status: DC

## 2021-06-22 NOTE — Telephone Encounter (Signed)
pended

## 2021-06-22 NOTE — Telephone Encounter (Signed)
Addendum to attached message. Pharmacy is:  [facility] [id number] - [place], [place] - [address]  Phone:  [phone]  Fax:  [phone]

## 2021-06-22 NOTE — Telephone Encounter (Signed)
Next visit is 08/24/21. Norma [name] is requesting refills on both her Adderall 20 and 30 mg.

## 2021-08-17 ENCOUNTER — Other Ambulatory Visit: Payer: Self-pay | Admitting: Adult Health

## 2021-08-17 DIAGNOSIS — F909 Attention-deficit hyperactivity disorder, unspecified type: Secondary | ICD-10-CM

## 2021-08-17 MED ORDER — AMPHETAMINE-DEXTROAMPHETAMINE 20 MG PO TABS: 20.0000 mg | ORAL_TABLET | Freq: Every day | ORAL | 0 refills | Status: DC

## 2021-08-17 MED ORDER — AMPHETAMINE-DEXTROAMPHET ER 30 MG PO CP24: 30.0000 mg | ORAL_CAPSULE | Freq: Every day | ORAL | 0 refills | Status: DC

## 2021-08-17 NOTE — Telephone Encounter (Signed)
Pt called wanting refill of both Adderalls to [facility], [place].  She has checked availability there.   Next appt 12/12

## 2021-08-17 NOTE — Telephone Encounter (Signed)
Pt said when she called earlier the [facility] said they had the Adderall.  They just called her back and said they got the refill request, but they don't have the med now.  She has found it at [place] at [facility].  Pls send both scripts there.

## 2021-08-24 ENCOUNTER — Ambulatory Visit: Payer: Self-pay | Admitting: Adult Health

## 2021-08-24 NOTE — Progress Notes (Signed)
Patient no show appointment. ? ?

## 2021-10-04 ENCOUNTER — Other Ambulatory Visit: Payer: Self-pay | Admitting: Adult Health

## 2021-10-04 DIAGNOSIS — F411 Generalized anxiety disorder: Secondary | ICD-10-CM

## 2021-10-06 ENCOUNTER — Other Ambulatory Visit: Payer: Self-pay

## 2021-10-06 ENCOUNTER — Ambulatory Visit (INDEPENDENT_AMBULATORY_CARE_PROVIDER_SITE_OTHER): Payer: Self-pay | Admitting: Adult Health

## 2021-10-06 ENCOUNTER — Encounter: Payer: Self-pay | Admitting: Adult Health

## 2021-10-06 DIAGNOSIS — F411 Generalized anxiety disorder: Secondary | ICD-10-CM

## 2021-10-06 DIAGNOSIS — F909 Attention-deficit hyperactivity disorder, unspecified type: Secondary | ICD-10-CM

## 2021-10-06 MED ORDER — FLUOXETINE HCL 20 MG PO CAPS: ORAL_CAPSULE | ORAL | 3 refills | Status: DC

## 2021-10-06 MED ORDER — AMPHETAMINE-DEXTROAMPHETAMINE 20 MG PO TABS: 20.0000 mg | ORAL_TABLET | Freq: Every day | ORAL | 0 refills | Status: DC

## 2021-10-06 MED ORDER — AMPHETAMINE-DEXTROAMPHET ER 30 MG PO CP24: 30.0000 mg | ORAL_CAPSULE | Freq: Every day | ORAL | 0 refills | Status: DC

## 2021-10-06 MED ORDER — AMPHETAMINE-DEXTROAMPHETAMINE 20 MG PO TABS
20.0000 mg | ORAL_TABLET | Freq: Every day | ORAL | 0 refills | Status: DC
Start: 2021-11-03 — End: 2021-10-28

## 2021-10-06 NOTE — Progress Notes (Signed)
NETANYA YAZDANI [id number] 1990/12/12 30 y.o.  Subjective:   Patient ID:  Norma [name] is a 31 y.o. (DOB Mar 29, 1991) female.  Chief Complaint: No chief complaint on file.   HPI Norma [name] presents to the office today for follow-up of GAD and ADHD.  Describes mood today as "ok". Pleasant. Mood symptoms - denies depression, anxiety, and irritability. Moods are consistent. Stating "I'm doing alright". Family doing well. Working as a Paramedic - Artist. Feels like medications are helpful. Stable interest and motivation. Taking medications as prescribed.  Energy levels stable. Active, has a regular exercise routine.  Enjoys some usual interests and activities. Married. Lives with husband and 3 children - 6,5, and 4. Husband staying at home with children. Husband's family local. She has family in [place]. Spending time with family.  Appetite adequate. Weight gain 170 pounds. Sleeps well most nights. Averages 7 to 8 hours.   Focus and concentration stable. Completing tasks. Managing aspects of household. Works full time as a Paramedic.  Denies SI or HI.  Denies AH or VH.    Review of Systems:  Review of Systems  Musculoskeletal:  Negative for gait problem.  Neurological:  Negative for tremors.  Psychiatric/Behavioral:         Please refer to HPI   Medications: I have reviewed the patient's current medications.  Current Outpatient Medications  Medication Sig Dispense Refill   [START ON 11/03/2021] amphetamine-dextroamphetamine (ADDERALL XR) 30 MG 24 hr capsule Take 1 capsule (30 mg total) by mouth daily. 30 capsule 0   [START ON 12/01/2021] amphetamine-dextroamphetamine (ADDERALL XR) 30 MG 24 hr capsule Take 1 capsule (30 mg total) by mouth daily. 30 capsule 0   [START ON 11/03/2021] amphetamine-dextroamphetamine (ADDERALL) 20 MG tablet Take 1 tablet (20 mg total) by mouth daily. 30 tablet 0   [START ON 12/01/2021] amphetamine-dextroamphetamine (ADDERALL) 20 MG tablet Take 1  tablet (20 mg total) by mouth daily. 30 tablet 0   acetaminophen (TYLENOL) 500 MG tablet Take 2 tablets (1,000 mg total) by mouth every 6 (six) hours. 30 tablet 0   albuterol (PROVENTIL HFA;VENTOLIN HFA) 108 (90 Base) MCG/ACT inhaler Inhale 2 puffs into the lungs every 6 (six) hours as needed for wheezing or shortness of breath.     amphetamine-dextroamphetamine (ADDERALL XR) 30 MG 24 hr capsule Take 1 capsule (30 mg total) by mouth daily. 30 capsule 0   amphetamine-dextroamphetamine (ADDERALL) 20 MG tablet Take 1 tablet (20 mg total) by mouth daily. 30 tablet 0   FLUoxetine (PROZAC) 20 MG capsule TAKE 1 CAPSULE BY MOUTH EVERY DAY 90 capsule 3   lisdexamfetamine (VYVANSE) 60 MG capsule Take 1 capsule (60 mg total) by mouth daily. 30 capsule 0   ondansetron (ZOFRAN ODT) 4 MG disintegrating tablet Take 1 tablet (4 mg total) by mouth every 8 (eight) hours as needed for nausea. 10 tablet 0   oxyCODONE-acetaminophen (PERCOCET) 5-325 MG tablet Take 1 tablet by mouth every 4 (four) hours as needed. (Patient taking differently: Take 1 tablet by mouth every 4 (four) hours as needed for moderate pain or severe pain. ) 20 tablet 0   rizatriptan (MAXALT-MLT) 10 MG disintegrating tablet Take 1 tablet (10 mg total) by mouth as needed for migraine. May repeat in 2 hours if needed 10 tablet 0   SUMAtriptan (IMITREX) 50 MG tablet Take 1 tablet (50 mg total) by mouth once for 1 dose. May repeat in 2 hours if headache persists or recurs. (Patient not taking: Reported on 05/28/2020)  10 tablet 1   tamsulosin (FLOMAX) 0.4 MG CAPS capsule Take 1 capsule (0.4 mg total) by mouth daily after supper. (Patient taking differently: Take 0.4 mg by mouth at bedtime. ) 10 capsule 0   No current facility-administered medications for this visit.    Medication Side Effects: None  Allergies: No Known Allergies  Past Medical History:  Diagnosis Date   Anemia    Asthma    exercise induced   Chronic allergic rhinitis 05/07/2019    Dysmenorrhea    Right ureteral stone     Past Medical History, Surgical history, Social history, and Family history were reviewed and updated as appropriate.   Please see review of systems for further details on the patient's review from today.   Objective:   Physical Exam:  LMP 09/02/2018 (Exact Date)   Physical Exam Constitutional:      General: She is not in acute distress. Musculoskeletal:        General: No deformity.  Neurological:     Mental Status: She is alert and oriented to person, place, and time.     Coordination: Coordination normal.  Psychiatric:        Attention and Perception: Attention and perception normal. She does not perceive auditory or visual hallucinations.        Mood and Affect: Mood normal. Mood is not anxious or depressed. Affect is not labile, blunt, angry or inappropriate.        Speech: Speech normal.        Behavior: Behavior normal.        Thought Content: Thought content normal. Thought content is not paranoid or delusional. Thought content does not include homicidal or suicidal ideation. Thought content does not include homicidal or suicidal plan.        Cognition and Memory: Cognition and memory normal.        Judgment: Judgment normal.     Comments: Insight intact    Lab Review:     Component Value Date/Time   NA 139 05/26/2020 0338   K 3.5 05/26/2020 0338   CL 105 05/26/2020 0338   CO2 23 05/26/2020 0338   GLUCOSE 123 (H) 05/26/2020 0338   BUN 13 05/26/2020 0338   CREATININE 0.65 05/26/2020 0338   CALCIUM 9.9 05/26/2020 0338   PROT 7.1 05/26/2020 0338   ALBUMIN 4.2 05/26/2020 0338   AST 17 05/26/2020 0338   ALT 18 05/26/2020 0338   ALKPHOS 65 05/26/2020 0338   BILITOT 0.5 05/26/2020 0338   GFRNONAA >60 05/26/2020 0338   GFRAA >60 05/26/2020 0338       Component Value Date/Time   WBC 8.4 05/26/2020 0338   RBC 3.99 05/26/2020 0338   HGB 13.0 05/26/2020 0338   HCT 38.5 05/26/2020 0338   PLT 198 05/26/2020 0338   MCV 96.5  05/26/2020 0338   MCH 32.6 05/26/2020 0338   MCHC 33.8 05/26/2020 0338   RDW 12.2 05/26/2020 0338   LYMPHSABS 2.1 03/18/2014 1218   MONOABS 0.3 03/18/2014 1218   EOSABS 0.2 03/18/2014 1218   BASOSABS 0.0 03/18/2014 1218    No results found for: POCLITH, LITHIUM   No results found for: PHENYTOIN, PHENOBARB, VALPROATE, CBMZ   .res Assessment: Plan:    Plan:  PDMP reviewed  1. Adderall XR 30 daily  2. Prozac 20mg  daily 3. Adderall 20mg  daily  BP - 118/82/93  RTC 3 months  Patient advised to contact office with any questions, adverse effects, or acute worsening in signs and symptoms.  Discussed potential benefits, risks, and side effects of stimulants with patient to include increased heart rate, palpitations, insomnia, increased anxiety, inceased irritability, or decreased appetite.  Instructed patient to contact office if experiencing any significant tolerability issues.  Diagnoses and all orders for this visit:  Attention deficit hyperactivity disorder (ADHD), unspecified ADHD type -     amphetamine-dextroamphetamine (ADDERALL XR) 30 MG 24 hr capsule; Take 1 capsule (30 mg total) by mouth daily. -     amphetamine-dextroamphetamine (ADDERALL XR) 30 MG 24 hr capsule; Take 1 capsule (30 mg total) by mouth daily. -     amphetamine-dextroamphetamine (ADDERALL XR) 30 MG 24 hr capsule; Take 1 capsule (30 mg total) by mouth daily. -     amphetamine-dextroamphetamine (ADDERALL) 20 MG tablet; Take 1 tablet (20 mg total) by mouth daily. -     amphetamine-dextroamphetamine (ADDERALL) 20 MG tablet; Take 1 tablet (20 mg total) by mouth daily. -     amphetamine-dextroamphetamine (ADDERALL) 20 MG tablet; Take 1 tablet (20 mg total) by mouth daily.  Generalized anxiety disorder -     FLUoxetine (PROZAC) 20 MG capsule; TAKE 1 CAPSULE BY MOUTH EVERY DAY     Please see After Visit Summary for patient specific instructions.  No future appointments.  No orders of the defined types were  placed in this encounter.   -------------------------------

## 2021-10-08 ENCOUNTER — Other Ambulatory Visit: Payer: Self-pay

## 2021-10-08 DIAGNOSIS — F909 Attention-deficit hyperactivity disorder, unspecified type: Secondary | ICD-10-CM

## 2021-10-08 MED ORDER — AMPHETAMINE-DEXTROAMPHETAMINE 20 MG PO TABS: 20.0000 mg | ORAL_TABLET | Freq: Every day | ORAL | 0 refills | Status: DC

## 2021-10-08 MED ORDER — AMPHETAMINE-DEXTROAMPHET ER 30 MG PO CP24: 30.0000 mg | ORAL_CAPSULE | Freq: Every day | ORAL | 0 refills | Status: DC

## 2021-10-08 NOTE — Telephone Encounter (Signed)
Patient called to say that pharmacy did not have her Adderall in stock. She said [facility] [place] [facility] and she would like scripts sent there.

## 2021-10-14 ENCOUNTER — Other Ambulatory Visit: Payer: Self-pay | Admitting: Adult Health

## 2021-10-14 DIAGNOSIS — F411 Generalized anxiety disorder: Secondary | ICD-10-CM

## 2021-10-28 ENCOUNTER — Other Ambulatory Visit: Payer: Self-pay | Admitting: Adult Health

## 2021-10-28 DIAGNOSIS — F909 Attention-deficit hyperactivity disorder, unspecified type: Secondary | ICD-10-CM

## 2021-10-28 MED ORDER — AMPHETAMINE-DEXTROAMPHETAMINE 20 MG PO TABS: 20.0000 mg | ORAL_TABLET | Freq: Every day | ORAL | 0 refills | Status: DC

## 2021-10-28 NOTE — Telephone Encounter (Signed)
Pt called and said that [facility] does not have the Adderall in stock, but [facility] on [address] does. Can we switch her scripts to the [facility]?

## 2021-12-02 ENCOUNTER — Other Ambulatory Visit: Payer: Self-pay | Admitting: Adult Health

## 2021-12-02 DIAGNOSIS — F411 Generalized anxiety disorder: Secondary | ICD-10-CM

## 2022-03-11 ENCOUNTER — Other Ambulatory Visit: Payer: Self-pay | Admitting: Adult Health

## 2022-03-11 DIAGNOSIS — F411 Generalized anxiety disorder: Secondary | ICD-10-CM

## 2022-03-24 ENCOUNTER — Telehealth: Payer: Self-pay | Admitting: Adult Health

## 2022-03-24 ENCOUNTER — Other Ambulatory Visit: Payer: Self-pay

## 2022-03-24 DIAGNOSIS — F909 Attention-deficit hyperactivity disorder, unspecified type: Secondary | ICD-10-CM

## 2022-03-24 MED ORDER — AMPHETAMINE-DEXTROAMPHETAMINE 20 MG PO TABS: 20.0000 mg | ORAL_TABLET | Freq: Every day | ORAL | 0 refills | Status: DC

## 2022-03-24 MED ORDER — AMPHETAMINE-DEXTROAMPHET ER 30 MG PO CP24: 30.0000 mg | ORAL_CAPSULE | Freq: Every day | ORAL | 0 refills | Status: DC

## 2022-03-24 NOTE — Telephone Encounter (Signed)
Pt is requesting refill of Adderall XR 30mg  and Adderall 20mg  to [facility] [place].  Next appt 7/24

## 2022-03-24 NOTE — Telephone Encounter (Signed)
Pended.

## 2022-04-05 ENCOUNTER — Ambulatory Visit (INDEPENDENT_AMBULATORY_CARE_PROVIDER_SITE_OTHER): Payer: Self-pay | Admitting: Adult Health

## 2022-04-05 DIAGNOSIS — F489 Nonpsychotic mental disorder, unspecified: Secondary | ICD-10-CM

## 2022-04-05 NOTE — Progress Notes (Signed)
Patient no show appointment. ? ?

## 2022-05-19 ENCOUNTER — Telehealth: Payer: Self-pay | Admitting: Adult Health

## 2022-05-19 ENCOUNTER — Other Ambulatory Visit: Payer: Self-pay

## 2022-05-19 DIAGNOSIS — F909 Attention-deficit hyperactivity disorder, unspecified type: Secondary | ICD-10-CM

## 2022-05-19 DIAGNOSIS — F411 Generalized anxiety disorder: Secondary | ICD-10-CM

## 2022-05-19 MED ORDER — AMPHETAMINE-DEXTROAMPHETAMINE 20 MG PO TABS: 20.0000 mg | ORAL_TABLET | Freq: Every day | ORAL | 0 refills | Status: DC

## 2022-05-19 MED ORDER — FLUOXETINE HCL 20 MG PO CAPS: 20.0000 mg | ORAL_CAPSULE | Freq: Every day | ORAL | 0 refills | Status: DC

## 2022-05-19 MED ORDER — AMPHETAMINE-DEXTROAMPHET ER 30 MG PO CP24: 30.0000 mg | ORAL_CAPSULE | Freq: Every day | ORAL | 0 refills | Status: DC

## 2022-05-19 NOTE — Telephone Encounter (Signed)
Pt made an appt for 9/13. She needs refills on her adderall xr 30 mg and adderall 20 mg. [facility] [place]. She also needs her prozac refilled as well

## 2022-05-19 NOTE — Telephone Encounter (Signed)
Pended.

## 2022-05-26 ENCOUNTER — Ambulatory Visit: Payer: Self-pay | Admitting: Adult Health

## 2022-06-07 ENCOUNTER — Encounter: Payer: Self-pay | Admitting: *Deleted

## 2022-06-09 ENCOUNTER — Ambulatory Visit (INDEPENDENT_AMBULATORY_CARE_PROVIDER_SITE_OTHER): Payer: Self-pay | Admitting: Adult Health

## 2022-06-09 DIAGNOSIS — F489 Nonpsychotic mental disorder, unspecified: Secondary | ICD-10-CM

## 2022-06-09 NOTE — Progress Notes (Signed)
Patient no show appointment. ? ?

## 2022-07-12 ENCOUNTER — Telehealth: Payer: Self-pay | Admitting: Adult Health

## 2022-07-12 ENCOUNTER — Other Ambulatory Visit: Payer: Self-pay

## 2022-07-12 DIAGNOSIS — F909 Attention-deficit hyperactivity disorder, unspecified type: Secondary | ICD-10-CM

## 2022-07-12 MED ORDER — AMPHETAMINE-DEXTROAMPHET ER 30 MG PO CP24: 30.0000 mg | ORAL_CAPSULE | Freq: Every day | ORAL | 0 refills | Status: DC

## 2022-07-12 NOTE — Telephone Encounter (Signed)
Norma [name] called at 9:05 to request refill of her Adderall 30mg .  Made appt 11/1.  [facility] [place]

## 2022-07-12 NOTE — Telephone Encounter (Signed)
Pended.

## 2022-07-14 ENCOUNTER — Ambulatory Visit: Payer: Self-pay | Admitting: Adult Health

## 2022-07-14 ENCOUNTER — Encounter: Payer: Self-pay | Admitting: Adult Health

## 2022-07-14 DIAGNOSIS — F909 Attention-deficit hyperactivity disorder, unspecified type: Secondary | ICD-10-CM

## 2022-07-14 DIAGNOSIS — F411 Generalized anxiety disorder: Secondary | ICD-10-CM

## 2022-07-14 MED ORDER — AMPHETAMINE-DEXTROAMPHET ER 30 MG PO CP24: 30.0000 mg | ORAL_CAPSULE | Freq: Every day | ORAL | 0 refills | Status: DC

## 2022-07-14 MED ORDER — AMPHETAMINE-DEXTROAMPHETAMINE 20 MG PO TABS: 20.0000 mg | ORAL_TABLET | Freq: Every day | ORAL | 0 refills | Status: DC

## 2022-07-14 MED ORDER — FLUOXETINE HCL 20 MG PO CAPS: 20.0000 mg | ORAL_CAPSULE | Freq: Every day | ORAL | 5 refills | Status: DC

## 2022-07-14 NOTE — Progress Notes (Signed)
JOUA BAKE [id number] [birth year] 30 y.o.  Subjective:   Patient ID:  Norma [name] is a 31 y.o. (DOB [birth year]) female.  Chief Complaint: No chief complaint on file.   HPI Norma [name] presents to the office today for follow-up of GAD and ADHD.  Describes mood today as "ok". Pleasant. Mood symptoms - denies depression, anxiety, and irritability. Moods are consistent - "feels level". Stating "I'm doing ok". Family doing well. Working as a Transport planner - Biomedical engineer. Feels like medications are helpful. Stable interest and motivation. Taking medications as prescribed.  Energy levels stable. Active, has a regular exercise routine.  Enjoys some usual interests and activities. Married. Lives with husband and 3 children. Husband staying at home with children. Husband's family local. She has family in [place]. Spending time with family.  Appetite adequate. Weight gain 170 pounds. Sleeps well most nights. Averages 7 to 8 hours.   Focus and concentration stable. Completing tasks. Managing aspects of household. Works full time as a Transport planner.  Denies SI or HI.  Denies AH or VH.  Review of Systems:  Review of Systems  Musculoskeletal:  Negative for gait problem.  Neurological:  Negative for tremors.  Psychiatric/Behavioral:         Please refer to HPI    Medications: I have reviewed the patient's current medications.  Current Outpatient Medications  Medication Sig Dispense Refill   acetaminophen (TYLENOL) 500 MG tablet Take 2 tablets (1,000 mg total) by mouth every 6 (six) hours. 30 tablet 0   albuterol (PROVENTIL HFA;VENTOLIN HFA) 108 (90 Base) MCG/ACT inhaler Inhale 2 puffs into the lungs every 6 (six) hours as needed for wheezing or shortness of breath.     amphetamine-dextroamphetamine (ADDERALL XR) 30 MG 24 hr capsule Take 1 capsule (30 mg total) by mouth daily. 30 capsule 0   amphetamine-dextroamphetamine (ADDERALL XR) 30 MG 24 hr capsule Take 1 capsule (30 mg total) by mouth  daily. 30 capsule 0   amphetamine-dextroamphetamine (ADDERALL XR) 30 MG 24 hr capsule Take 1 capsule (30 mg total) by mouth daily. 30 capsule 0   amphetamine-dextroamphetamine (ADDERALL) 20 MG tablet Take 1 tablet (20 mg total) by mouth daily. 30 tablet 0   amphetamine-dextroamphetamine (ADDERALL) 20 MG tablet Take 1 tablet (20 mg total) by mouth daily. 30 tablet 0   amphetamine-dextroamphetamine (ADDERALL) 20 MG tablet Take 1 tablet (20 mg total) by mouth daily. 30 tablet 0   FLUoxetine (PROZAC) 20 MG capsule Take 1 capsule (20 mg total) by mouth daily. 30 capsule 0   lisdexamfetamine (VYVANSE) 60 MG capsule Take 1 capsule (60 mg total) by mouth daily. 30 capsule 0   ondansetron (ZOFRAN ODT) 4 MG disintegrating tablet Take 1 tablet (4 mg total) by mouth every 8 (eight) hours as needed for nausea. 10 tablet 0   oxyCODONE-acetaminophen (PERCOCET) 5-325 MG tablet Take 1 tablet by mouth every 4 (four) hours as needed. (Patient taking differently: Take 1 tablet by mouth every 4 (four) hours as needed for moderate pain or severe pain. ) 20 tablet 0   rizatriptan (MAXALT-MLT) 10 MG disintegrating tablet Take 1 tablet (10 mg total) by mouth as needed for migraine. May repeat in 2 hours if needed 10 tablet 0   SUMAtriptan (IMITREX) 50 MG tablet Take 1 tablet (50 mg total) by mouth once for 1 dose. May repeat in 2 hours if headache persists or recurs. (Patient not taking: Reported on 05/28/2020) 10 tablet 1   tamsulosin (FLOMAX) 0.4 MG CAPS capsule Take  1 capsule (0.4 mg total) by mouth daily after supper. (Patient taking differently: Take 0.4 mg by mouth at bedtime. ) 10 capsule 0   No current facility-administered medications for this visit.    Medication Side Effects: None  Allergies: No Known Allergies  Past Medical History:  Diagnosis Date   Anemia    Asthma    exercise induced   Chronic allergic rhinitis 05/07/2019   Dysmenorrhea    Right ureteral stone     Past Medical History, Surgical  history, Social history, and Family history were reviewed and updated as appropriate.   Please see review of systems for further details on the patient's review from today.   Objective:   Physical Exam:  LMP 09/02/2018 (Exact Date)   Physical Exam Constitutional:      General: She is not in acute distress. Musculoskeletal:        General: No deformity.  Neurological:     Mental Status: She is alert and oriented to person, place, and time.     Coordination: Coordination normal.  Psychiatric:        Attention and Perception: Attention and perception normal. She does not perceive auditory or visual hallucinations.        Mood and Affect: Mood normal. Mood is not anxious or depressed. Affect is not labile, blunt, angry or inappropriate.        Speech: Speech normal.        Behavior: Behavior normal.        Thought Content: Thought content normal. Thought content is not paranoid or delusional. Thought content does not include homicidal or suicidal ideation. Thought content does not include homicidal or suicidal plan.        Cognition and Memory: Cognition and memory normal.        Judgment: Judgment normal.     Comments: Insight intact     Lab Review:     Component Value Date/Time   NA 139 05/26/2020 0338   K 3.5 05/26/2020 0338   CL 105 05/26/2020 0338   CO2 23 05/26/2020 0338   GLUCOSE 123 (H) 05/26/2020 0338   BUN 13 05/26/2020 0338   CREATININE 0.65 05/26/2020 0338   CALCIUM 9.9 05/26/2020 0338   PROT 7.1 05/26/2020 0338   ALBUMIN 4.2 05/26/2020 0338   AST 17 05/26/2020 0338   ALT 18 05/26/2020 0338   ALKPHOS 65 05/26/2020 0338   BILITOT 0.5 05/26/2020 0338   GFRNONAA >60 05/26/2020 0338   GFRAA >60 05/26/2020 0338       Component Value Date/Time   WBC 8.4 05/26/2020 0338   RBC 3.99 05/26/2020 0338   HGB 13.0 05/26/2020 0338   HCT 38.5 05/26/2020 0338   PLT 198 05/26/2020 0338   MCV 96.5 05/26/2020 0338   MCH 32.6 05/26/2020 0338   MCHC 33.8 05/26/2020 0338    RDW 12.2 05/26/2020 0338   LYMPHSABS 2.1 03/18/2014 1218   MONOABS 0.3 03/18/2014 1218   EOSABS 0.2 03/18/2014 1218   BASOSABS 0.0 03/18/2014 1218    No results found for: "POCLITH", "LITHIUM"   No results found for: "PHENYTOIN", "PHENOBARB", "VALPROATE", "CBMZ"   .res Assessment: Plan:    Plan:  PDMP reviewed  1. Adderall XR 30 daily  2. Prozac 20mg  daily 3. Adderall 20mg  daily  BP - 118/82/93  RTC 3 months  Patient advised to contact office with any questions, adverse effects, or acute worsening in signs and symptoms.  Discussed potential benefits, risks, and side effects of stimulants with  patient to include increased heart rate, palpitations, insomnia, increased anxiety, inceased irritability, or decreased appetite.  Instructed patient to contact office if experiencing any significant tolerability issues.  There are no diagnoses linked to this encounter.   Please see After Visit Summary for patient specific instructions.  No future appointments.  No orders of the defined types were placed in this encounter.   -------------------------------

## 2022-08-14 ENCOUNTER — Other Ambulatory Visit: Payer: Self-pay | Admitting: Adult Health

## 2022-08-14 DIAGNOSIS — F411 Generalized anxiety disorder: Secondary | ICD-10-CM

## 2022-08-17 ENCOUNTER — Other Ambulatory Visit: Payer: Self-pay

## 2022-08-17 ENCOUNTER — Telehealth: Payer: Self-pay | Admitting: Adult Health

## 2022-08-17 DIAGNOSIS — F909 Attention-deficit hyperactivity disorder, unspecified type: Secondary | ICD-10-CM

## 2022-08-17 MED ORDER — AMPHETAMINE-DEXTROAMPHET ER 30 MG PO CP24: 30.0000 mg | ORAL_CAPSULE | Freq: Every day | ORAL | 0 refills | Status: DC

## 2022-08-17 NOTE — Telephone Encounter (Signed)
Next visit is 01/12/23. Norma [name] is requesting a refill on her Adderall XR 30 mg called to:  [id number] - [place], [place] - [address]   Phone: [phone]  Fax: [phone]    Per Ceonna it is in stock at this location.

## 2022-08-17 NOTE — Telephone Encounter (Signed)
Pended.

## 2022-08-26 ENCOUNTER — Encounter: Payer: Self-pay | Admitting: *Deleted

## 2023-01-12 ENCOUNTER — Encounter: Payer: Self-pay | Admitting: Adult Health

## 2023-01-12 ENCOUNTER — Ambulatory Visit (INDEPENDENT_AMBULATORY_CARE_PROVIDER_SITE_OTHER): Payer: Self-pay | Admitting: Adult Health

## 2023-01-12 DIAGNOSIS — F909 Attention-deficit hyperactivity disorder, unspecified type: Secondary | ICD-10-CM

## 2023-01-12 DIAGNOSIS — F411 Generalized anxiety disorder: Secondary | ICD-10-CM

## 2023-01-12 MED ORDER — AMPHETAMINE-DEXTROAMPHET ER 30 MG PO CP24: 30.0000 mg | ORAL_CAPSULE | Freq: Every day | ORAL | 0 refills | Status: DC

## 2023-01-12 MED ORDER — AMPHETAMINE-DEXTROAMPHETAMINE 20 MG PO TABS: 20.0000 mg | ORAL_TABLET | Freq: Every day | ORAL | 0 refills | Status: DC

## 2023-01-12 MED ORDER — AMPHETAMINE-DEXTROAMPHET ER 30 MG PO CP24
30.0000 mg | ORAL_CAPSULE | Freq: Every day | ORAL | 0 refills | Status: DC
Start: 2023-03-09 — End: 2023-05-27

## 2023-01-12 MED ORDER — AMPHETAMINE-DEXTROAMPHETAMINE 20 MG PO TABS
20.0000 mg | ORAL_TABLET | Freq: Every day | ORAL | 0 refills | Status: DC
Start: 2023-03-09 — End: 2023-05-27

## 2023-01-12 MED ORDER — FLUOXETINE HCL 20 MG PO CAPS: 20.0000 mg | ORAL_CAPSULE | Freq: Every day | ORAL | 3 refills | Status: DC

## 2023-01-12 NOTE — Progress Notes (Signed)
Norma [name] [id number] [birth year] 32 y.o.  Subjective:   Patient ID:  Norma [name] is a 32 y.o. (DOB 09-08-1991) female.  Chief Complaint: No chief complaint on file.   HPI Norma [name] presents to the office today for follow-up of GAD and ADHD.  Describes mood today as "ok". Pleasant. Mood symptoms - denies depression, anxiety, and irritability. Moods are consistent. Stating "I'm doing good". Feels like medications work well. Stable interest and motivation. Taking medications as prescribed.  Energy levels stable. Active, has a regular exercise routine.  Enjoys some usual interests and activities. Married. Lives with husband and 3 children. Husband's family local. She has family in [place]. Spending time with family.  Appetite adequate. Weight gain 170 pounds. Sleeps well most nights. Averages 7 to 8 hours.   Focus and concentration stable. Completing tasks. Managing aspects of household. Works full time as a Paramedic.  Denies SI or HI.  Denies AH or VH.      Review of Systems:  Review of Systems  Musculoskeletal:  Negative for gait problem.  Neurological:  Negative for tremors.  Psychiatric/Behavioral:         Please refer to HPI    Medications: I have reviewed the patient's current medications.  Current Outpatient Medications  Medication Sig Dispense Refill   acetaminophen (TYLENOL) 500 MG tablet Take 2 tablets (1,000 mg total) by mouth every 6 (six) hours. 30 tablet 0   albuterol (PROVENTIL HFA;VENTOLIN HFA) 108 (90 Base) MCG/ACT inhaler Inhale 2 puffs into the lungs every 6 (six) hours as needed for wheezing or shortness of breath.     amphetamine-dextroamphetamine (ADDERALL XR) 30 MG 24 hr capsule Take 1 capsule (30 mg total) by mouth daily. 30 capsule 0   amphetamine-dextroamphetamine (ADDERALL XR) 30 MG 24 hr capsule Take 1 capsule (30 mg total) by mouth daily. 30 capsule 0   amphetamine-dextroamphetamine (ADDERALL XR) 30 MG 24 hr capsule Take 1 capsule (30 mg  total) by mouth daily. 30 capsule 0   amphetamine-dextroamphetamine (ADDERALL) 20 MG tablet Take 1 tablet (20 mg total) by mouth daily. 30 tablet 0   amphetamine-dextroamphetamine (ADDERALL) 20 MG tablet Take 1 tablet (20 mg total) by mouth daily. 30 tablet 0   amphetamine-dextroamphetamine (ADDERALL) 20 MG tablet Take 1 tablet (20 mg total) by mouth daily. 30 tablet 0   FLUoxetine (PROZAC) 20 MG capsule TAKE 1 CAPSULE BY MOUTH EVERY DAY 90 capsule 3   ondansetron (ZOFRAN ODT) 4 MG disintegrating tablet Take 1 tablet (4 mg total) by mouth every 8 (eight) hours as needed for nausea. 10 tablet 0   oxyCODONE-acetaminophen (PERCOCET) 5-325 MG tablet Take 1 tablet by mouth every 4 (four) hours as needed. (Patient taking differently: Take 1 tablet by mouth every 4 (four) hours as needed for moderate pain or severe pain. ) 20 tablet 0   rizatriptan (MAXALT-MLT) 10 MG disintegrating tablet Take 1 tablet (10 mg total) by mouth as needed for migraine. May repeat in 2 hours if needed 10 tablet 0   SUMAtriptan (IMITREX) 50 MG tablet Take 1 tablet (50 mg total) by mouth once for 1 dose. May repeat in 2 hours if headache persists or recurs. (Patient not taking: Reported on 05/28/2020) 10 tablet 1   tamsulosin (FLOMAX) 0.4 MG CAPS capsule Take 1 capsule (0.4 mg total) by mouth daily after supper. (Patient taking differently: Take 0.4 mg by mouth at bedtime. ) 10 capsule 0   No current facility-administered medications for this visit.  Medication Side Effects: None  Allergies: No Known Allergies  Past Medical History:  Diagnosis Date   Anemia    Asthma    exercise induced   Chronic allergic rhinitis 05/07/2019   Dysmenorrhea    Right ureteral stone     Past Medical History, Surgical history, Social history, and Family history were reviewed and updated as appropriate.   Please see review of systems for further details on the patient's review from today.   Objective:   Physical Exam:  LMP 09/02/2018  (Exact Date)   Physical Exam Constitutional:      General: She is not in acute distress. Musculoskeletal:        General: No deformity.  Neurological:     Mental Status: She is alert and oriented to person, place, and time.     Coordination: Coordination normal.  Psychiatric:        Attention and Perception: Attention and perception normal. She does not perceive auditory or visual hallucinations.        Mood and Affect: Mood normal. Mood is not anxious or depressed. Affect is not labile, blunt, angry or inappropriate.        Speech: Speech normal.        Behavior: Behavior normal.        Thought Content: Thought content normal. Thought content is not paranoid or delusional. Thought content does not include homicidal or suicidal ideation. Thought content does not include homicidal or suicidal plan.        Cognition and Memory: Cognition and memory normal.        Judgment: Judgment normal.     Comments: Insight intact     Lab Review:     Component Value Date/Time   NA 139 05/26/2020 0338   K 3.5 05/26/2020 0338   CL 105 05/26/2020 0338   CO2 23 05/26/2020 0338   GLUCOSE 123 (H) 05/26/2020 0338   BUN 13 05/26/2020 0338   CREATININE 0.65 05/26/2020 0338   CALCIUM 9.9 05/26/2020 0338   PROT 7.1 05/26/2020 0338   ALBUMIN 4.2 05/26/2020 0338   AST 17 05/26/2020 0338   ALT 18 05/26/2020 0338   ALKPHOS 65 05/26/2020 0338   BILITOT 0.5 05/26/2020 0338   GFRNONAA >60 05/26/2020 0338   GFRAA >60 05/26/2020 0338       Component Value Date/Time   WBC 8.4 05/26/2020 0338   RBC 3.99 05/26/2020 0338   HGB 13.0 05/26/2020 0338   HCT 38.5 05/26/2020 0338   PLT 198 05/26/2020 0338   MCV 96.5 05/26/2020 0338   MCH 32.6 05/26/2020 0338   MCHC 33.8 05/26/2020 0338   RDW 12.2 05/26/2020 0338   LYMPHSABS 2.1 03/18/2014 1218   MONOABS 0.3 03/18/2014 1218   EOSABS 0.2 03/18/2014 1218   BASOSABS 0.0 03/18/2014 1218    No results found for: "POCLITH", "LITHIUM"   No results found  for: "PHENYTOIN", "PHENOBARB", "VALPROATE", "CBMZ"   .res Assessment: Plan:    Plan:  PDMP reviewed  1. Adderall XR 30 daily  2. Prozac 20mg  daily 3. Adderall 20mg  daily  Monitor BP between visits while taking stimulant medication.   RTC 6 months  Patient advised to contact office with any questions, adverse effects, or acute worsening in signs and symptoms.  Discussed potential benefits, risks, and side effects of stimulants with patient to include increased heart rate, palpitations, insomnia, increased anxiety, inceased irritability, or decreased appetite.  Instructed patient to contact office if experiencing any significant tolerability issues.  There are  no diagnoses linked to this encounter.   Please see After Visit Summary for patient specific instructions.  Future Appointments  Date Time Provider Department Center  01/12/2023  1:00 PM Khilynn Borntreger, Thereasa Solo, NP [facility] None    No orders of the defined types were placed in this encounter.   -------------------------------

## 2023-04-22 ENCOUNTER — Ambulatory Visit: Payer: Medicaid Other | Admitting: Obstetrics and Gynecology

## 2023-05-27 ENCOUNTER — Encounter: Payer: Self-pay | Admitting: Obstetrics and Gynecology

## 2023-05-27 ENCOUNTER — Ambulatory Visit (INDEPENDENT_AMBULATORY_CARE_PROVIDER_SITE_OTHER): Payer: Medicaid Other | Admitting: Obstetrics and Gynecology

## 2023-05-27 VITALS — BP 126/88 | HR 105 | Ht 66.75 in | Wt 170.0 lb

## 2023-05-27 DIAGNOSIS — K5902 Outlet dysfunction constipation: Secondary | ICD-10-CM | POA: Diagnosis not present

## 2023-05-27 DIAGNOSIS — R159 Full incontinence of feces: Secondary | ICD-10-CM

## 2023-05-27 DIAGNOSIS — R350 Frequency of micturition: Secondary | ICD-10-CM | POA: Diagnosis not present

## 2023-05-27 DIAGNOSIS — M62838 Other muscle spasm: Secondary | ICD-10-CM

## 2023-05-27 DIAGNOSIS — N393 Stress incontinence (female) (male): Secondary | ICD-10-CM | POA: Diagnosis not present

## 2023-05-27 LAB — POCT URINALYSIS DIPSTICK
Bilirubin, UA: NEGATIVE
Blood, UA: NEGATIVE
Glucose, UA: NEGATIVE
Ketones, UA: NEGATIVE
Leukocytes, UA: NEGATIVE
Nitrite, UA: NEGATIVE
Protein, UA: NEGATIVE
Spec Grav, UA: 1.01 (ref 1.010–1.025)
Urobilinogen, UA: 0.2 U/dL
pH, UA: 7 (ref 5.0–8.0)

## 2023-05-27 NOTE — Progress Notes (Signed)
[facility] New Patient Evaluation and Consultation  Referring Provider: Willow Ora, MD PCP: Willow Ora, MD Date of Service: 05/27/2023  SUBJECTIVE Chief Complaint: New Patient (Initial Visit) Norma [name] is a 32 y.o. female here for a consult for prolapse. Pt said she has a repair in 2020.)  History of Present Illness: Norma [name] is a 32 y.o.  biracial  female presenting for evaluation of prolapse.    Review of records significant for: In 2020, had TVH, anterior and posterior repair, McCall's culdoplasty, cystoscopy with Dr Elon Spanner  Urinary Symptoms: Leaks urine with lifting, with movement to the bathroom, and without sensation Does not leak every day- mostly with exercise Pad use: not every day She is bothered by her UI symptoms.  Day time voids 3.  Nocturia: 0 times per night to void. Voiding dysfunction: she does not empty her bladder well.  does not use a catheter to empty bladder.  When urinating, she feels a weak stream and to push on her belly or vagina to empty bladder  UTIs:  a few  UTI's in the last year.   Reports history of kidney or bladder stones  Pelvic Organ Prolapse Symptoms:                  She Admits to a feeling of a bulge the vaginal area. Mostly when she has to have a bowel movement She Denies seeing a bulge.  This bulge is bothersome. Has done pelvic PT before her surgery. Surgery lasted for a few years. Now feels the perineum bulges out with constipation.   Bowel Symptom: Bowel movements: every 3-4 days Stool consistency: hard, soft , or loose Straining: yes.  Splinting: yes.  Incomplete evacuation: yes.  She Admits to accidental bowel leakage / fecal incontinence  Occurs: usually for a few days after a BM noticed in her underwear  Consistency with leakage: soft  Bowel regimen:  adderall- if she takes laxative or softener then it makes BM uncontrollable Had more urgency with fiber supplement (metamucil).  Sexual  Function Sexually active: yes.  Sexual orientation:  heterosexual Pain with sex: at the vaginal opening, has discomfort due to prolapse, has discomfort due to dryness  Pelvic Pain Denies pelvic pain    Past Medical History:  Past Medical History:  Diagnosis Date   Anemia    Asthma    exercise induced   Chronic allergic rhinitis 05/07/2019   Dysmenorrhea    Right ureteral stone      Past Surgical History:   Past Surgical History:  Procedure Laterality Date   ADENOIDECTOMY     ANTERIOR AND POSTERIOR REPAIR WITH SACROSPINOUS FIXATION N/A 09/25/2018   Procedure: ANTERIOR AND POSTERIOR REPAIR, MC CALLS;  Surgeon: Ranae Pila, MD;  Location: [facility];  Service: Gynecology;  Laterality: N/A;   CYSTOSCOPY N/A 09/25/2018   Procedure: CYSTOSCOPY;  Surgeon: Ranae Pila, MD;  Location: [facility];  Service: Gynecology;  Laterality: N/A;   CYSTOSCOPY WITH RETROGRADE PYELOGRAM, URETEROSCOPY AND STENT PLACEMENT Left 05/29/2020   Procedure: CYSTOSCOPY WITH LEFT  RETROGRADE URETEROSCOPY AND STENT PLACEMENT;  Surgeon: Bjorn Pippin, MD;  Location: [facility];  Service: Urology;  Laterality: Left;   DILATION AND CURETTAGE OF UTERUS N/A 03/30/2018   Procedure: DILATATION AND EVACUATION;  Surgeon: Richarda Overlie, MD;  Location: [facility];  Service: Gynecology;  Laterality: N/A;   TONSILLECTOMY     VAGINAL HYSTERECTOMY Bilateral 09/25/2018   Procedure: VAGINAL HYSTERECTOMY, BILATERAL FIMBRECTOMY;  Surgeon: Elon Spanner,  Madelaine Etienne, MD;  Location: [facility];  Service: Gynecology;  Laterality: Bilateral;   VAGINAL HYSTERECTOMY     WISDOM TOOTH EXTRACTION       Past OB/GYN History: OB History  Gravida Para Term Preterm AB Living  3 3 3     3   SAB IAB Ectopic Multiple Live Births        0 3    # Outcome Date GA Lbr Len/2nd Weight Sex Type Anes PTL Lv  3 Term 03/27/18 [redacted]w[redacted]d 25:15 / 00:18 9 lb 7.5 oz (4.295 kg) M Vag-Spont EPI  LIV  2 Term 09/05/16 [redacted]w[redacted]d 06:48 / 02:01 8 lb 9.9 oz (3.909 kg) F  Vag-Spont EPI  LIV  1 Term 01/31/15 [redacted]w[redacted]d 24:28 / 04:04 8 lb 2 oz (3.685 kg) M Vag-Vacuum EPI  LIV    S/p hyst   Medications: She has a current medication list which includes the following prescription(s): acetaminophen and albuterol.   Allergies: Patient has No Known Allergies.   Social History:  Social History   Tobacco Use   Smoking status: Never   Smokeless tobacco: Never  Vaping Use   Vaping status: Never Used  Substance Use Topics   Alcohol use: Yes    Comment: occasional when not pregnant   Drug use: No    Relationship status: married She lives with husband and children.   She is employed as a Pharmacist, hospital. Regular exercise: Yes: outside walking, lifting weights History of abuse: No  Family History:   Family History  Problem Relation Age of Onset   Diabetes Mother    Hypertension Mother    Healthy Daughter      Review of Systems: Review of Systems  Constitutional:  Negative for fever, malaise/fatigue and weight loss.  Respiratory:  Negative for cough, shortness of breath and wheezing.   Cardiovascular:  Negative for chest pain, palpitations and leg swelling.  Gastrointestinal:  Positive for abdominal pain. Negative for blood in stool.  Genitourinary:  Negative for dysuria.  Musculoskeletal:  Negative for myalgias.  Skin:  Negative for rash.  Neurological:  Positive for headaches. Negative for dizziness.  Endo/Heme/Allergies:  Does not bruise/bleed easily.  Psychiatric/Behavioral:  Negative for depression. The patient is nervous/anxious.      OBJECTIVE Physical Exam: Vitals:   05/27/23 1248  BP: 126/88  Pulse: (!) 105  Weight: 170 lb (77.1 kg)  Height: 5' 6.75" (1.695 m)    Physical Exam Constitutional:      General: She is not in acute distress. Pulmonary:     Effort: Pulmonary effort is normal.  Abdominal:     General: There is no distension.     Palpations: Abdomen is soft.     Tenderness: There is no abdominal tenderness.  There is no rebound.  Musculoskeletal:        General: No swelling. Normal range of motion.  Skin:    General: Skin is warm and dry.     Findings: No rash.  Neurological:     Mental Status: She is alert and oriented to person, place, and time.  Psychiatric:        Mood and Affect: Mood normal.        Behavior: Behavior normal.      GU / Detailed Urogynecologic Evaluation:  Pelvic Exam: Normal external female genitalia; Bartholin's and Skene's glands normal in appearance; urethral meatus normal in appearance, no urethral masses or discharge.   CST: negative  s/p hysterectomy: Speculum exam reveals normal vaginal mucosa without  atrophy and normal vaginal cuff.  Adnexa normal adnexa.    Pelvic floor strength I/V, puborectalis III/V external anal sphincter IV/V  Pelvic floor musculature: Right levator non-tender, Right obturator non-tender, Left levator non-tender, Left obturator non-tender  POP-Q:   POP-Q  -3                                            Aa   -3                                           Ba  -8                                              C   3                                            Gh  3.5                                            Pb  8                                            tvl   -3                                            Ap  -3                                            Bp                                                 D      Rectal Exam:  Normal sphincter tone, no distal rectocele, enterocoele not present, no rectal masses, noted dyssynergia when asking the patient to bear down.  Post-Void Residual (PVR) by Bladder Scan: In order to evaluate bladder emptying, we discussed obtaining a postvoid residual and she agreed to this procedure.  Procedure: The ultrasound unit was placed on the patient's abdomen in the suprapubic region after the patient had voided. A PVR of 6 ml was obtained by bladder scan.  Laboratory Results: POC  urine: negative   ASSESSMENT AND PLAN Ms. Michalik is a 32 y.o. with:  1. Rectosphincteric dyssynergia   2. Levator spasm   3. Urinary frequency   4. SUI (stress urinary incontinence, female)   5. Incontinence of feces, unspecified fecal incontinence type    -  No prolapse noted on exam today.   Dyssynergia/ levator spasm - The origin of pelvic floor muscle spasm can be multifactorial, including primary, reactive to a different pain source, trauma, or even part of a centralized pain syndrome.Treatment options include pelvic floor physical therapy, local (vaginal) or oral  muscle relaxants, pelvic muscle trigger point injections or centrally acting pain medications.   - We discussed that the muscles surrounding the rectum are not functioning to relax appropriately when having a bowel movement.  - Recommended Pelvic PT for pelvic floor muscle retraining, referral placed.   2. SUI For treatment of stress urinary incontinence,  non-surgical options include expectant management, weight loss, physical therapy, as well as a pessary.  Surgical options include a midurethral sling, Burch urethropexy, and transurethral injection of a bulking agent. - She does not want any specific treatment at this time. Will start with pelvic PT  3. Accidental Bowel Leakage:  - Treatment options include anti-diarrhea medication (loperamide/ Imodium OTC or prescription lomotil), fiber supplements, physical therapy, and possible sacral neuromodulation or surgery.   - Likely due to constipation and incomplete emptying. Recommended fiber supplement for stool bulking/ to help with constipation.  - Also use squatty potty for support with bowel movements  Return as needed   Marguerita Beards, MD

## 2023-05-27 NOTE — Patient Instructions (Addendum)
Accidental Bowel Leakage: Our goal is to achieve formed bowel movements daily or every-other-day without leakage.  You may need to try different combinations of the following options to find what works best for you.  Some management options include: Dietary changes (more leafy greens, vegetables and fruits; less processed foods) Fiber supplementation (Metamucil or something with psyllium as active ingredient) Over-the-counter imodium (tablets or liquid) to help solidify the stool and prevent leakage of stool. If you get constipated you can use Miralax as needed to achieve bowel movements.  Restart using a squatty potty  No prolapse seen on exam today.   Vulvovaginal moisturizer Options: Vitamin E oil (pump or capsule) or cream (Gene's Vit E Cream) Coconut oil Silicone-based lubricant for use during intercourse ("uberlube" or "wet platinum" is a brand available at most drugstores) Crisco Consider the ingredients of the product - the fewer the ingredients the better!  Directions for Use: Clean and dry your hands Gently dab the vulvar/vaginal area dry as needed Apply a "pea-sized" amount of the moisturizer onto your fingertip Using you other hand, open the labia  Apply the moisturizer to the vulvar/vaginal tissues Wear loose fitting underwear/clothing if possible following application Use moisturize up to 3 times daily as desired.

## 2023-07-05 ENCOUNTER — Telehealth: Payer: Self-pay | Admitting: Adult Health

## 2023-07-05 ENCOUNTER — Other Ambulatory Visit: Payer: Self-pay

## 2023-07-05 MED ORDER — AMPHETAMINE-DEXTROAMPHET ER 30 MG PO CP24: 30.0000 mg | ORAL_CAPSULE | Freq: Every day | ORAL | 0 refills | Status: DC

## 2023-07-05 MED ORDER — AMPHETAMINE-DEXTROAMPHETAMINE 20 MG PO TABS: 20.0000 mg | ORAL_TABLET | Freq: Every day | ORAL | 0 refills | Status: DC

## 2023-07-05 NOTE — Telephone Encounter (Signed)
Pt called and said that she needs adderall xr 30 mg and adderall 20 mg. Pharmacy is [facility] in [place]. She  was last seen in may due back in 6 months

## 2023-07-05 NOTE — Telephone Encounter (Signed)
Pended.

## 2023-08-26 ENCOUNTER — Ambulatory Visit (INDEPENDENT_AMBULATORY_CARE_PROVIDER_SITE_OTHER): Payer: Medicaid Other | Admitting: Family Medicine

## 2023-08-26 ENCOUNTER — Encounter: Payer: Self-pay | Admitting: Family Medicine

## 2023-08-26 VITALS — BP 128/86 | HR 96 | Temp 99.0°F | Ht 66.75 in | Wt 170.2 lb

## 2023-08-26 DIAGNOSIS — R4586 Emotional lability: Secondary | ICD-10-CM | POA: Diagnosis not present

## 2023-08-26 DIAGNOSIS — G43009 Migraine without aura, not intractable, without status migrainosus: Secondary | ICD-10-CM | POA: Diagnosis not present

## 2023-08-26 DIAGNOSIS — R6882 Decreased libido: Secondary | ICD-10-CM

## 2023-08-26 DIAGNOSIS — N809 Endometriosis, unspecified: Secondary | ICD-10-CM

## 2023-08-26 DIAGNOSIS — N643 Galactorrhea not associated with childbirth: Secondary | ICD-10-CM | POA: Diagnosis not present

## 2023-08-26 DIAGNOSIS — L659 Nonscarring hair loss, unspecified: Secondary | ICD-10-CM | POA: Diagnosis not present

## 2023-08-26 DIAGNOSIS — Q048 Other specified congenital malformations of brain: Secondary | ICD-10-CM | POA: Insufficient documentation

## 2023-08-26 DIAGNOSIS — Z90710 Acquired absence of both cervix and uterus: Secondary | ICD-10-CM

## 2023-08-26 DIAGNOSIS — N644 Mastodynia: Secondary | ICD-10-CM

## 2023-08-26 DIAGNOSIS — F458 Other somatoform disorders: Secondary | ICD-10-CM | POA: Insufficient documentation

## 2023-08-26 HISTORY — DX: Endometriosis, unspecified: N80.9

## 2023-08-26 LAB — CBC WITH DIFFERENTIAL/PLATELET
Basophils Absolute: 0 10*3/uL (ref 0.0–0.1)
Basophils Relative: 0.7 % (ref 0.0–3.0)
Eosinophils Absolute: 0.2 10*3/uL (ref 0.0–0.7)
Eosinophils Relative: 2.7 % (ref 0.0–5.0)
HCT: 41.3 % (ref 36.0–46.0)
Hemoglobin: 14 g/dL (ref 12.0–15.0)
Lymphocytes Relative: 33 % (ref 12.0–46.0)
Lymphs Abs: 1.9 10*3/uL (ref 0.7–4.0)
MCHC: 34 g/dL (ref 30.0–36.0)
MCV: 97.4 fL (ref 78.0–100.0)
Monocytes Absolute: 0.4 10*3/uL (ref 0.1–1.0)
Monocytes Relative: 6.6 % (ref 3.0–12.0)
Neutro Abs: 3.3 10*3/uL (ref 1.4–7.7)
Neutrophils Relative %: 57 % (ref 43.0–77.0)
Platelets: 247 10*3/uL (ref 150.0–400.0)
RBC: 4.24 Mil/uL (ref 3.87–5.11)
RDW: 11.9 % (ref 11.5–15.5)
WBC: 5.7 10*3/uL (ref 4.0–10.5)

## 2023-08-26 LAB — COMPREHENSIVE METABOLIC PANEL
ALT: 14 U/L (ref 0–35)
AST: 13 U/L (ref 0–37)
Albumin: 4.7 g/dL (ref 3.5–5.2)
Alkaline Phosphatase: 60 U/L (ref 39–117)
BUN: 10 mg/dL (ref 6–23)
CO2: 25 meq/L (ref 19–32)
Calcium: 9.7 mg/dL (ref 8.4–10.5)
Chloride: 105 meq/L (ref 96–112)
Creatinine, Ser: 0.68 mg/dL (ref 0.40–1.20)
GFR: 114.75 mL/min (ref >60.00)
Glucose, Bld: 83 mg/dL (ref 70–99)
Potassium: 4.3 meq/L (ref 3.5–5.1)
Sodium: 139 meq/L (ref 135–145)
Total Bilirubin: 0.6 mg/dL (ref 0.2–1.2)
Total Protein: 7.3 g/dL (ref 6.0–8.3)

## 2023-08-26 LAB — B12 AND FOLATE PANEL
Folate: 11 ng/mL (ref >5.9)
Vitamin B-12: 209 pg/mL — ABNORMAL LOW (ref 211–911)

## 2023-08-26 LAB — VITAMIN D 25 HYDROXY (VIT D DEFICIENCY, FRACTURES): VITD: 15.35 ng/mL — ABNORMAL LOW (ref 30.00–100.00)

## 2023-08-26 LAB — FOLLICLE STIMULATING HORMONE: FSH: 2.1 m[IU]/mL

## 2023-08-26 LAB — TSH: TSH: 2.02 u[IU]/mL (ref 0.35–5.50)

## 2023-08-26 MED ORDER — RIZATRIPTAN BENZOATE 10 MG PO TBDP: 10.0000 mg | ORAL_TABLET | ORAL | 1 refills | Status: DC | PRN

## 2023-08-26 NOTE — Patient Instructions (Signed)
Please return in 3-6 months for your annual complete physical; please come fasting.   I will release your lab results to you on your MyChart account with further instructions. You may see the results before I do, but when I review them I will send you a message with my report or have my assistant call you if things need to be discussed. Please reply to my message with any questions. Thank you!   If you have any questions or concerns, please don't hesitate to send me a message via MyChart or call the office at [phone]. Thank you for visiting with [place] today! It's our pleasure caring for you.   Galactorrhea Galactorrhea is a milky fluid (discharge) from the breast. The discharge may come from one or both nipples. It is different from the milk made by females who are breastfeeding. Galactorrhea usually occurs in females but sometimes happens in males. This condition can be caused by many things. Most cases do not require treatment. Galactorrhea can also be a sign of other conditions, such as diseases of the kidney or thyroid, or problems with the pituitary gland. Your health care provider may do tests to help find the cause. What are the causes? This condition may be caused by: Irritation of the breast. This may be caused by an injury, stimulation during sexual activity, or clothes rubbing against the nipple. Certain prescription medicines. Birth control pills. Certain herbal supplements. Changes in hormone levels. Sometimes, the cause is not known. What are the signs or symptoms? The main symptom of this condition is nipple discharge. The discharge is often white, yellow, or green. It can come from one or both breasts. The discharge may flow without stopping, or it may stop and start again. How is this diagnosed? This condition may be diagnosed based on: Collecting and testing the discharge. Blood tests. Imaging tests. Tests to rule out other conditions. How is this treated? In many  cases, galactorrhea will go away without treatment. In this case, your provider may monitor your condition to make sure that it goes away. When treatment is required, your provider will treat the underlying cause, such as irritation of the nipples or changes in hormone levels. Certain medicines may be stopped, if they are causing your symptoms. Follow these instructions at home: Breast care  Use breast pads to absorb the discharge. Do not squeeze your breasts or nipples. Avoid breast stimulation during sexual activity. Avoid clothes that rub on your nipples. Wear a breast binder or a supportive bra. This will help prevent clothes from rubbing on your nipples. Watch your condition for any changes. Do a breast self-exam only once a month or as told by your provider. Doing this more often can irritate your breasts. General instructions Take over-the-counter and prescription medicines only as told by your provider. Keep all follow-up visits. Your provider may need to determine the cause and treat the condition if needed. Contact a health care provider if: You develop hot flashes, vaginal dryness, or a lack of sexual desire. You stop having menstrual periods, or they become irregular or far apart. You have headaches. You have vision problems. You have changes in your breast, such as: Redness, pain, or swelling. Discharge that is bloody or looks like pus. Changes to the skin on your breasts. This includes wrinkles. dimples, or other changes. A new or painful lump that does not go away after your period ends. This information is not intended to replace advice given to you by your health care provider.  Make sure you discuss any questions you have with your health care provider. Document Revised: 05/10/2022 Document Reviewed: 05/10/2022 Elsevier Patient Education  2024 [employer].

## 2023-08-26 NOTE — Progress Notes (Signed)
Subjective  CC:  Chief Complaint  Patient presents with   Migraine    For the past 6mos pt notice she has been having more migraiins, breast leaking(Lt)/sore, and hair getting thinner and shedding    HPI: Norma [name] is a 32 y.o. female who presents to the office today to address the problems listed above in the chief complaint. Discussed the use of AI scribe software for clinical note transcription with the patient, who gave verbal consent to proceed.  History of Present Illness   The patient, with a history of hysterectomy due to severe prolapse and endometriosis, presents with concerns about hormonal changes. She reports that for the past six months, she has noticed her hair becoming more coarse and thinning, her skin becoming drier, and an increase in irritability and night sweats. She also reports constant breast soreness, which increases at certain times of the month, and a single episode of clear fluid leakage from the left breast. The patient also reports a significant decrease in libido and painful intercourse due to vaginal dryness, requiring repeated use of lubricant.  In addition to these symptoms, the patient reports an increase in the frequency of migraines over the past five months. She describes these migraines as frontal, throbbing headaches that can be managed if caught early with ibuprofen or Excedrin. If not caught early, the migraines become severe, causing light sensitivity and nausea. The patient has previously been prescribed maxalt, which she reports as helpful in managing her migraines. She is getting headaches about once per month at most. No neurologic changes. 2020 Brain MRI showed    IMPRESSION: 1. No acute intracranial abnormality. 2. 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 3. Otherwise unremarkable and normal brain MRI.   Assessment  1. Migraine without aura and without status migrainosus, not intractable   2. Breast  tenderness   3. Galactorrhea   4. Mood changes   5. S/P hysterectomy   6. Low libido   7. Bruxism   8. Hair thinning   9. Endometriosis   10. Cerebellar tonsillar ectopia (HCC)      Plan  Assessment and Plan    Galactorrhea - none on exam today - check prolactin Tsh, cmp and estradiol - monitor, consider brain MRI  Mood and skin changes:  Symptoms include coarse and thinning hair, dry skin, irritability, night sweats, breast tenderness, and clear fluid discharge from the left breast for six months. History of hysterectomy with ovaries preserved. Differential diagnosis includes hormonal fluctuations, premature menopause, thyroid dysfunction, and prolactin imbalance, rarer: renal insufficiency.  - Order blood work: thyroid function tests, vitamin levels, kidney function, prolactin, estrogen, and FSH levels - consider gyn eval - no mood problems identified  Vaginal Dryness and Dyspareunia Reports vaginal dryness and painful intercourse for six months, with no libido and repeated use of lubricants. Differential diagnosis includes hormonal imbalance and post-hysterectomy changes. - Evaluate hormone levels with blood work - Follow-up with gynecologist if lab results are normal and symptoms persist  Migraine Headaches Recurrent migraines with throbbing frontal headache, light sensitivity, nausea, and dizziness, increasing in frequency over five months. Previous use of triptans and Excedrin with caffeine. Discussed effectiveness of Maxalt (Rizatriptan) and importance of managing triggers such as stress and hormonal changes. No red flag sxs but MRI showed borderline tonsillar ectopia. - Prescribe Maxalt (Rizatriptan) for acute migraine management - consider repeat brain MRI - check prolactin levels  Follow-up 3-6 mo for cpe     Orders Placed This  Encounter  Procedures   Prolactin   Follicle stimulating hormone   Estradiol   CBC with Differential/Platelet   TSH   VITAMIN D 25  Hydroxy (Vit-D Deficiency, Fractures)   B12 and Folate Panel   Comprehensive metabolic panel   Meds ordered this encounter  Medications   rizatriptan (MAXALT-MLT) 10 MG disintegrating tablet    Sig: Take 1 tablet (10 mg total) by mouth as needed for migraine. May repeat in 2 hours if needed    Dispense:  10 tablet    Refill:  1     I reviewed the patients updated PMH, FH, and SocHx.    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   albuterol (PROVENTIL HFA;VENTOLIN HFA) 108 (90 Base) MCG/ACT inhaler Inhale 2 puffs into the lungs every 6 (six) hours as needed for wheezing or shortness of breath.   amphetamine-dextroamphetamine (ADDERALL XR) 30 MG 24 hr capsule Take 1 capsule (30 mg total) by mouth daily.   amphetamine-dextroamphetamine (ADDERALL) 20 MG tablet Take 1 tablet (20 mg total) by mouth daily.    Allergies: Patient has no known allergies. Family History: Patient family history includes Diabetes in her mother; Healthy in her daughter; Hypertension in her mother. Social History:  Patient  reports that she has never smoked. She has never used smokeless tobacco. She reports current alcohol use. She reports that she does not use drugs.  Review of Systems: Constitutional: Negative for fever malaise or anorexia Cardiovascular: negative for chest pain Respiratory: negative for SOB or  persistent cough Gastrointestinal: negative for abdominal pain  Objective  Vitals: BP 128/86   Pulse 96   Temp 99 F (37.2 C)   Ht 5' 6.75" (1.695 m)   Wt 170 lb 3.2 oz (77.2 kg)   LMP 09/02/2018 (Exact Date)   SpO2 98%   BMI 26.86 kg/m  General: no acute distress , A&Ox3 Psych: normal affect HEENT: PEERL, conjunctiva normal, neck is supple, no thyromegaly Breast: normal appearing, no breast mass, nipple discharge or adenopathy on exam. Pt reports increased ttp diffusely. No cystic changes palpated Cardiovascular:  RRR without murmur or gallop.  Respiratory:  Good breath sounds bilaterally, CTAB with normal respiratory effort Skin:  Warm, no rashes Neuro: no tremor  Commons side effects, risks, benefits, and alternatives for medications and treatment plan prescribed today were discussed, and the patient expressed understanding of the given instructions. Patient is instructed to call or message via MyChart if he/she has any questions or concerns regarding our treatment plan. No barriers to understanding were identified. We discussed Red Flag symptoms and signs in detail. Patient expressed understanding regarding what to do in case of urgent or emergency type symptoms.  Medication list was reconciled, printed and provided to the patient in AVS. Patient instructions and summary information was reviewed with the patient as documented in the AVS. This note was prepared with assistance of Dragon voice recognition software. Occasional wrong-word  or sound-a-like substitutions may have occurred due to the inherent limitations of voice recognition software

## 2023-08-27 LAB — PROLACTIN: Prolactin: 7.6 ng/mL

## 2023-08-27 LAB — ESTRADIOL: Estradiol: 53 pg/mL

## 2023-08-30 ENCOUNTER — Other Ambulatory Visit: Payer: Self-pay

## 2023-08-30 ENCOUNTER — Telehealth: Payer: Self-pay | Admitting: Adult Health

## 2023-08-30 MED ORDER — AMPHETAMINE-DEXTROAMPHETAMINE 20 MG PO TABS: 20.0000 mg | ORAL_TABLET | Freq: Every day | ORAL | 0 refills | Status: DC

## 2023-08-30 MED ORDER — AMPHETAMINE-DEXTROAMPHET ER 30 MG PO CP24: 30.0000 mg | ORAL_CAPSULE | Freq: Every day | ORAL | 0 refills | Status: DC

## 2023-08-30 NOTE — Telephone Encounter (Signed)
Next visit is 09/13/23. Requesting refill on Adderall 30 and 40 mg called to:  [facility] [id number] - [place], [place] - [address]   Phone: [phone]  Fax: [phone]    Patient checked and the dosages are in stock at this location.

## 2023-08-30 NOTE — Telephone Encounter (Signed)
PENDED ADDERALL 20 AND 30 MG TO REQUESTED PHARMACY

## 2023-09-08 NOTE — Progress Notes (Signed)
See mychart note Dear Ms. Norma [name], The results show that both your Vitamin D levels and Vitamin B12 levels are too low. These could be contributing to some of your symptoms. Please start OTC Vitamin D 2000 units daily along with an OTC Vitamin B12 daily supplement. I have also ordered a prescription Vitamin D supplement to take weekly for 12 weeks to get your D back up to normal.  Your hormone levels are all in the normal range, so I do not see any sign of early menopause. I recommend seeing a gynecologist to help address your vaginal dryness and pain concerns. Let me know if you need a referral.  We will follow up on your headaches as we discussed.  Sincerely, Dr. Mardelle Matte

## 2023-09-13 ENCOUNTER — Ambulatory Visit (INDEPENDENT_AMBULATORY_CARE_PROVIDER_SITE_OTHER): Payer: Medicaid Other | Admitting: Adult Health

## 2023-09-13 ENCOUNTER — Encounter: Payer: Self-pay | Admitting: Adult Health

## 2023-09-13 DIAGNOSIS — F909 Attention-deficit hyperactivity disorder, unspecified type: Secondary | ICD-10-CM

## 2023-09-13 MED ORDER — AMPHETAMINE-DEXTROAMPHET ER 30 MG PO CP24: 30.0000 mg | ORAL_CAPSULE | Freq: Every day | ORAL | 0 refills | Status: DC

## 2023-09-13 MED ORDER — AMPHETAMINE-DEXTROAMPHETAMINE 20 MG PO TABS: 20.0000 mg | ORAL_TABLET | Freq: Every day | ORAL | 0 refills | Status: DC

## 2023-09-13 NOTE — Progress Notes (Signed)
 Norma [name] [id number] 05/13/1991 32 y.o.  Subjective:   Patient ID:  Norma [name] is a 32 y.o. (DOB November 22, 1990) female.  Chief Complaint: No chief complaint on file.   HPI Norma [name] presents to the office today for follow-up of GAD and ADHD.  Describes mood today as ok. Pleasant. Mood symptoms - denies depression, anxiety, and irritability. Denies panic attacks. Denies worry, rumination, and over thinking. Moods are consistent. Stating I feel like I'm doing good. Feels like medications work well. Stable interest and motivation. Taking medications as prescribed.  Energy levels stable. Active, has a regular exercise routine.  Enjoys some usual interests and activities. Married. Lives with husband and 3 children. Husband's family local. She has family in [place]. Spending time with family.  Appetite adequate. Weight gain 170 pounds. Sleeps well most nights. Averages 7 to 8 hours.   Focus and concentration stable. Completing tasks. Managing aspects of household. Works full time as a paramedic.  Denies SI or HI.  Denies AH or VH. Denies self harm. Denies substance use.    PHQ2-9    Flowsheet Row Office Visit from 08/26/2023 in [facility] Total Score 0        Review of Systems:  Review of Systems  Musculoskeletal:  Negative for gait problem.  Neurological:  Negative for tremors.  Psychiatric/Behavioral:         Please refer to HPI   Medications: I have reviewed the patient's current medications.  Current Outpatient Medications  Medication Sig Dispense Refill   [START ON 10/11/2023] amphetamine -dextroamphetamine  (ADDERALL XR) 30 MG 24 hr capsule Take 1 capsule (30 mg total) by mouth daily. 30 capsule 0   [START ON 11/08/2023] amphetamine -dextroamphetamine  (ADDERALL XR) 30 MG 24 hr capsule Take 1 capsule (30 mg total) by mouth daily. 30 capsule 0   [START ON 10/11/2023] amphetamine -dextroamphetamine  (ADDERALL) 20 MG  tablet Take 1 tablet (20 mg total) by mouth daily. 30 tablet 0   [START ON 11/08/2023] amphetamine -dextroamphetamine  (ADDERALL) 20 MG tablet Take 1 tablet (20 mg total) by mouth daily. 30 tablet 0   acetaminophen  (TYLENOL ) 500 MG tablet Take 2 tablets (1,000 mg total) by mouth every 6 (six) hours. 30 tablet 0   albuterol  (PROVENTIL  HFA;VENTOLIN  HFA) 108 (90 Base) MCG/ACT inhaler Inhale 2 puffs into the lungs every 6 (six) hours as needed for wheezing or shortness of breath.     amphetamine -dextroamphetamine  (ADDERALL XR) 30 MG 24 hr capsule Take 1 capsule (30 mg total) by mouth daily. 30 capsule 0   amphetamine -dextroamphetamine  (ADDERALL) 20 MG tablet Take 1 tablet (20 mg total) by mouth daily. 30 tablet 0   rizatriptan  (MAXALT -MLT) 10 MG disintegrating tablet Take 1 tablet (10 mg total) by mouth as needed for migraine. May repeat in 2 hours if needed 10 tablet 1   No current facility-administered medications for this visit.    Medication Side Effects: None  Allergies:  Allergies  Allergen Reactions   Cat Hair Extract Other (See Comments)    Past Medical History:  Diagnosis Date   Anemia    Asthma    exercise induced   Chronic allergic rhinitis 05/07/2019   Dysmenorrhea    Endometriosis 08/26/2023   Pt reported; found during partial hysterectomy    Right ureteral stone     Past Medical History, Surgical history, Social history, and Family history were reviewed and updated as appropriate.   Please see review of systems for further details on the  patient's review from today.   Objective:   Physical Exam:  LMP 09/02/2018 (Exact Date)   Physical Exam Constitutional:      General: She is not in acute distress. Musculoskeletal:        General: No deformity.  Neurological:     Mental Status: She is alert and oriented to person, place, and time.     Coordination: Coordination normal.  Psychiatric:        Attention and Perception: Attention and perception normal. She does not  perceive auditory or visual hallucinations.        Mood and Affect: Mood normal. Mood is not anxious or depressed. Affect is not labile, blunt, angry or inappropriate.        Speech: Speech normal.        Behavior: Behavior normal.        Thought Content: Thought content normal. Thought content is not paranoid or delusional. Thought content does not include homicidal or suicidal ideation. Thought content does not include homicidal or suicidal plan.        Cognition and Memory: Cognition and memory normal.        Judgment: Judgment normal.     Comments: Insight intact     Lab Review:     Component Value Date/Time   NA 139 08/26/2023 0946   K 4.3 08/26/2023 0946   CL 105 08/26/2023 0946   CO2 25 08/26/2023 0946   GLUCOSE 83 08/26/2023 0946   BUN 10 08/26/2023 0946   CREATININE 0.68 08/26/2023 0946   CALCIUM 9.7 08/26/2023 0946   PROT 7.3 08/26/2023 0946   ALBUMIN 4.7 08/26/2023 0946   AST 13 08/26/2023 0946   ALT 14 08/26/2023 0946   ALKPHOS 60 08/26/2023 0946   BILITOT 0.6 08/26/2023 0946   GFRNONAA >60 05/26/2020 0338   GFRAA >60 05/26/2020 0338       Component Value Date/Time   WBC 5.7 08/26/2023 0946   RBC 4.24 08/26/2023 0946   HGB 14.0 08/26/2023 0946   HCT 41.3 08/26/2023 0946   PLT 247.0 08/26/2023 0946   MCV 97.4 08/26/2023 0946   MCH 32.6 05/26/2020 0338   MCHC 34.0 08/26/2023 0946   RDW 11.9 08/26/2023 0946   LYMPHSABS 1.9 08/26/2023 0946   MONOABS 0.4 08/26/2023 0946   EOSABS 0.2 08/26/2023 0946   BASOSABS 0.0 08/26/2023 0946    No results found for: POCLITH, LITHIUM   No results found for: PHENYTOIN, PHENOBARB, VALPROATE, CBMZ   .res Assessment: Plan:    Plan:  PDMP reviewed  Adderall XR 30 daily  Adderall 20mg  daily  D/C Prozac  20mg  daily  Monitor BP between visits while taking stimulant medication.   RTC 6 months - will call in 3 months for next set of refills.   Patient advised to contact office with any questions, adverse  effects, or acute worsening in signs and symptoms.  Discussed potential benefits, risks, and side effects of stimulants with patient to include increased heart rate, palpitations, insomnia, increased anxiety, inceased irritability, or decreased appetite.  Instructed patient to contact office if experiencing any significant tolerability issues.  Diagnoses and all orders for this visit:  Attention deficit hyperactivity disorder (ADHD), unspecified ADHD type -     amphetamine -dextroamphetamine  (ADDERALL XR) 30 MG 24 hr capsule; Take 1 capsule (30 mg total) by mouth daily. -     amphetamine -dextroamphetamine  (ADDERALL) 20 MG tablet; Take 1 tablet (20 mg total) by mouth daily. -     amphetamine -dextroamphetamine  (ADDERALL XR) 30 MG  24 hr capsule; Take 1 capsule (30 mg total) by mouth daily. -     amphetamine -dextroamphetamine  (ADDERALL XR) 30 MG 24 hr capsule; Take 1 capsule (30 mg total) by mouth daily. -     amphetamine -dextroamphetamine  (ADDERALL) 20 MG tablet; Take 1 tablet (20 mg total) by mouth daily. -     amphetamine -dextroamphetamine  (ADDERALL) 20 MG tablet; Take 1 tablet (20 mg total) by mouth daily.     Please see After Visit Summary for patient specific instructions.  Future Appointments  Date Time Provider Department Center  01/25/2024  8:30 AM Jodie Lavern CROME, MD [facility]    No orders of the defined types were placed in this encounter.   -------------------------------

## 2023-12-02 ENCOUNTER — Telehealth: Payer: Self-pay | Admitting: Family Medicine

## 2023-12-02 ENCOUNTER — Other Ambulatory Visit: Payer: Self-pay

## 2023-12-02 DIAGNOSIS — L659 Nonscarring hair loss, unspecified: Secondary | ICD-10-CM

## 2023-12-02 NOTE — Telephone Encounter (Unsigned)
 Copied from CRM [id number]. Topic: Referral - Request for Referral >> Dec 01, 2023  4:29 PM Chantha C wrote: Did the patient discuss referral with their provider in the last year? Patient saw Dr. Mardelle Matte in 08/26/23 about hairloss and the condition has worsen and wants to see a dermatologist. Patient has an upcoming appointment in 01/25/24 but wants to see a dermatologist now at [facility] or anyone that takes her insurance. Please advise and call back [phone].

## 2023-12-05 ENCOUNTER — Encounter: Payer: Self-pay | Admitting: Family Medicine

## 2023-12-05 ENCOUNTER — Ambulatory Visit (INDEPENDENT_AMBULATORY_CARE_PROVIDER_SITE_OTHER): Admitting: Family Medicine

## 2023-12-05 VITALS — BP 129/86 | HR 82 | Temp 98.2°F | Ht 66.75 in | Wt 168.4 lb

## 2023-12-05 DIAGNOSIS — Q048 Other specified congenital malformations of brain: Secondary | ICD-10-CM | POA: Diagnosis not present

## 2023-12-05 DIAGNOSIS — E538 Deficiency of other specified B group vitamins: Secondary | ICD-10-CM | POA: Insufficient documentation

## 2023-12-05 DIAGNOSIS — G43009 Migraine without aura, not intractable, without status migrainosus: Secondary | ICD-10-CM | POA: Diagnosis not present

## 2023-12-05 DIAGNOSIS — E559 Vitamin D deficiency, unspecified: Secondary | ICD-10-CM

## 2023-12-05 DIAGNOSIS — L659 Nonscarring hair loss, unspecified: Secondary | ICD-10-CM

## 2023-12-05 LAB — VITAMIN B12: Vitamin B-12: 449 pg/mL (ref 211–911)

## 2023-12-05 LAB — VITAMIN D 25 HYDROXY (VIT D DEFICIENCY, FRACTURES): VITD: 18.25 ng/mL — ABNORMAL LOW (ref 30.00–100.00)

## 2023-12-05 NOTE — Patient Instructions (Signed)
 Please follow up if symptoms do not improve or as needed.    VISIT SUMMARY:  Today, we discussed your worsening headaches and migraines, which have been occurring more frequently over the past three months. We also addressed your Chiari malformation, musculoskeletal pain, and recent hair loss (alopecia).  YOUR PLAN:  -MIGRAINE: Migraines are severe headaches often accompanied by nausea and sensitivity to light. We will refer you to a neurologist for further evaluation and management. In the meantime, you will continue using rizatriptan and start using Zavzpret nasal spray for rescue treatment. We may consider preventive medication after your neurologist visit.  -CHIARI MALFORMATION: Chiari malformation is a condition where brain tissue extends into the spinal canal, which can cause headaches and other symptoms. We will refer you to a neurologist for a thorough evaluation and management plan.  -MUSCULOSKELETAL PAIN: Musculoskeletal pain involves discomfort in the muscles, ligaments, and bones. We will check your vitamin D and B12 levels, as deficiencies could be contributing to your pain. You can use ibuprofen for pain relief and ensure you are getting adequate nutrition and sleep.  -ALOPECIA: Alopecia is a condition that causes hair loss. We will refer you to a dermatologist for an evaluation. You may contact [facility] in [place] for a shorter wait time for your dermatology appointment.  INSTRUCTIONS:  Please follow up with the neurologist for your migraines and Chiari malformation evaluation. Additionally, schedule an appointment with a dermatologist for your alopecia. Ensure you get your vitamin D and B12 levels checked. Continue using rizatriptan and start using Zavzpret nasal spray as needed for migraines. Use ibuprofen for musculoskeletal pain and maintain good nutrition and sleep habits.

## 2023-12-05 NOTE — Progress Notes (Signed)
 Subjective  CC:  Chief Complaint  Patient presents with   Headache    Intense headaches (increased in the last 2 months), neck pain, dizziness, body weakness, hair loss/ bald spot on head    HPI: Norma [name] is a 32 y.o. female who presents to the office today to address the problems listed above in the chief complaint. Discussed the use of AI scribe software for clinical note transcription with the patient, who gave verbal consent to proceed.  History of Present Illness   Norma [name] is a 33 year old female with Chiari malformation who presents with worsening headaches and migraines.  Over the past three months, she has experienced worsening headaches and migraines. The migraines occur at least once every other week, sometimes weekly, and often start in the lower neck and back. They can be unilateral or bilateral, frequently beginning at night and causing her to wake up with a headache. Associated symptoms include nausea and photophobia. She uses rizatriptan (Maxalt) to manage her migraines, which reduces the severity by about 50%. On two occasions, she has taken a second dose when the first was insufficient. She has been tracking potential triggers using an app but has not identified any consistent patterns.  She has a history cerebellar tonsillar ectopia , identified during an MRI in 2020 after the birth of her son. She experiences muscle tension in her neck and difficulty lowering her head without pain, which concerns her. She denies tension or stress related pain/heachaces.  In addition to migraines, she experiences lightheadedness and dizziness, particularly when standing up quickly or bending over. These symptoms occur even after eating, suggesting they are not related to hypoglycemia.  She reports muscle soreness and restlessness in her thighs, similar to post-exercise soreness, despite not having exercised. She has been taking vitamins and supplements due to previous  deficiencies, but her current levels are unknown.  She also reports alopecia, noticing a large bald spot on the back of her head about a month ago, which has been growing. She has been taking pictures to track the progression and notes thinning in other areas of her scalp.       Assessment  1. Migraine without aura and without status migrainosus, not intractable   2. Cerebellar tonsillar ectopia (HCC)   3. Vitamin D deficiency   4. Vitamin B12 deficiency   5. Alopecia      Plan  Assessment and Plan    Migraine Increased frequency to biweekly with partial relief from rizatriptan. Discussed newer treatments and neurologist referral due to Chiari malformation. - Refer to neurologist for migraines and Chiari malformation evaluation. - Prescribe Zavzpret nasal spray for rescue treatment. - Continue rizatriptan for management. - Consider preventive medication post-neurologist evaluation.  Chiari Malformation Worsening headaches necessitate neurologist evaluation for management. - Refer to neurologist for Chiari malformation evaluation.  Musculoskeletal Pain Neck, back, and thigh pain possibly linked to vitamin D deficiency. Unrelated to migraines but requires neurologist input due to Chiari malformation. - Check vitamin D and B12 levels. - Use ibuprofen for pain management. - Ensure adequate nutrition and sleep.  Alopecia Large bald spot and hair thinning suggest alopecia. Dermatology referral needed. - Ensure dermatology referral for alopecia evaluation. - Advise contacting [facility] for shorter dermatology wait times.        Orders Placed This Encounter  Procedures   Vitamin B12   VITAMIN D 25 Hydroxy (Vit-D Deficiency, Fractures)   Ambulatory referral to Neurology   No orders of  the defined types were placed in this encounter.    I reviewed the patients updated PMH, FH, and SocHx.    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    Allergies: Patient is allergic to cat dander. Family History: Patient family history includes Diabetes in her mother; Healthy in her daughter; Hypertension in her mother. Social History:  Patient  reports that she has never smoked. She has never used smokeless tobacco. She reports current alcohol use. She reports that she does not use drugs.  Review of Systems: Constitutional: Negative for fever malaise or anorexia Cardiovascular: negative for chest pain Respiratory: negative for SOB or persistent cough Gastrointestinal: negative for abdominal pain  Objective  Vitals: BP 129/86   Pulse 82   Temp 98.2 F (36.8 C)   Ht 5' 6.75" (1.695 m)   Wt 168 lb 6.4 oz (76.4 kg)   LMP 09/02/2018 (Exact Date)   SpO2 100%   BMI 26.57 kg/m  General: no acute distress , A&Ox3 Scalp: approx 3cm annular bald area w/o rash  Commons side effects, risks, benefits, and alternatives for medications and treatment plan prescribed today were discussed, and the patient expressed understanding of the given instructions. Patient is instructed to call or message via MyChart if he/she has any questions or concerns regarding our treatment plan. No barriers to understanding were identified. We discussed Red Flag symptoms and signs in detail. Patient expressed understanding regarding  what to do in case of urgent or emergency type symptoms.  Medication list was reconciled, printed and provided to the patient in AVS. Patient instructions and summary information was reviewed with the patient as documented in the AVS. This note was prepared with assistance of Dragon voice recognition software. Occasional wrong-word or sound-a-like substitutions may have occurred due to the inherent limitations of voice recognition software

## 2023-12-06 ENCOUNTER — Encounter: Payer: Self-pay | Admitting: Neurology

## 2023-12-12 ENCOUNTER — Encounter: Payer: Self-pay | Admitting: Family Medicine

## 2023-12-12 NOTE — Progress Notes (Signed)
 See mychart note Dear Ms. Norma [name], Your vitamin B12 level is normal but your Vitamin D level is low. Please take OTC Vitamin D 2000 units daily. Sincerely, Dr. Mardelle Matte

## 2024-01-02 ENCOUNTER — Emergency Department (HOSPITAL_BASED_OUTPATIENT_CLINIC_OR_DEPARTMENT_OTHER)

## 2024-01-02 ENCOUNTER — Encounter (HOSPITAL_BASED_OUTPATIENT_CLINIC_OR_DEPARTMENT_OTHER): Payer: Self-pay | Admitting: Emergency Medicine

## 2024-01-02 ENCOUNTER — Other Ambulatory Visit: Payer: Self-pay

## 2024-01-02 ENCOUNTER — Emergency Department (HOSPITAL_BASED_OUTPATIENT_CLINIC_OR_DEPARTMENT_OTHER): Admission: EM | Admit: 2024-01-02 | Discharge: 2024-01-02 | Disposition: A | Discharge status: Home / Self Care

## 2024-01-02 DIAGNOSIS — N132 Hydronephrosis with renal and ureteral calculous obstruction: Secondary | ICD-10-CM | POA: Diagnosis not present

## 2024-01-02 DIAGNOSIS — N201 Calculus of ureter: Secondary | ICD-10-CM

## 2024-01-02 DIAGNOSIS — R109 Unspecified abdominal pain: Secondary | ICD-10-CM | POA: Diagnosis present

## 2024-01-02 LAB — CBC
HCT: 37.9 % (ref 36.0–46.0)
Hemoglobin: 13.2 g/dL (ref 12.0–15.0)
MCH: 32.5 pg (ref 26.0–34.0)
MCHC: 34.8 g/dL (ref 30.0–36.0)
MCV: 93.3 fL (ref 80.0–100.0)
Platelets: 239 10*3/uL (ref 150–400)
RBC: 4.06 MIL/uL (ref 3.87–5.11)
RDW: 11.7 % (ref 11.5–15.5)
WBC: 7.6 10*3/uL (ref 4.0–10.5)
nRBC: 0 % (ref 0.0–0.2)

## 2024-01-02 LAB — BASIC METABOLIC PANEL WITH GFR
Anion gap: 10 (ref 5–15)
BUN: 14 mg/dL (ref 6–20)
CO2: 23 mmol/L (ref 22–32)
Calcium: 9.5 mg/dL (ref 8.9–10.3)
Chloride: 106 mmol/L (ref 98–111)
Creatinine, Ser: 0.72 mg/dL (ref 0.44–1.00)
GFR, Estimated: >60 mL/min (ref >60)
Glucose, Bld: 123 mg/dL — ABNORMAL HIGH (ref 70–99)
Potassium: 3.9 mmol/L (ref 3.5–5.1)
Sodium: 139 mmol/L (ref 135–145)

## 2024-01-02 LAB — URINALYSIS, ROUTINE W REFLEX MICROSCOPIC
Bacteria, UA: NONE SEEN
Bilirubin Urine: NEGATIVE
Glucose, UA: NEGATIVE mg/dL
Ketones, ur: NEGATIVE mg/dL
Leukocytes,Ua: NEGATIVE
Nitrite: NEGATIVE
Protein, ur: 30 mg/dL — AB
RBC / HPF: >50 RBC/hpf (ref 0–5)
Specific Gravity, Urine: 1.02 (ref 1.005–1.030)
pH: 8 (ref 5.0–8.0)

## 2024-01-02 MED ORDER — OXYCODONE-ACETAMINOPHEN 5-325 MG PO TABS: 1.0000 | ORAL_TABLET | Freq: Four times a day (QID) | ORAL | 0 refills | Status: AC | PRN

## 2024-01-02 MED ORDER — ONDANSETRON HCL 4 MG/2ML IJ SOLN
4.0000 mg | Freq: Once | INTRAMUSCULAR | Status: AC
Start: 2024-01-02 — End: 2024-01-02
  Administered 2024-01-02: 4 mg via INTRAVENOUS
  Filled 2024-01-02: qty 2

## 2024-01-02 MED ORDER — ONDANSETRON 4 MG PO TBDP: 4.0000 mg | ORAL_TABLET | Freq: Three times a day (TID) | ORAL | 0 refills | Status: AC | PRN

## 2024-01-02 MED ORDER — KETOROLAC TROMETHAMINE 15 MG/ML IJ SOLN
15.0000 mg | Freq: Once | INTRAMUSCULAR | Status: AC
  Administered 2024-01-02: 15 mg via INTRAVENOUS
  Filled 2024-01-02: qty 1

## 2024-01-02 NOTE — ED Triage Notes (Signed)
 C/o left flank pain starting this morning. Hx of kidney stones. Rolling around in bed.   States she took pain and nausea meds from previous kidney stone PTA.

## 2024-01-02 NOTE — ED Notes (Signed)
 Pt given discharge instructions and reviewed prescriptions. Opportunities given for questions. Pt verbalizes understanding. PIV removed x1. Jillyn Hidden, RN

## 2024-01-02 NOTE — Discharge Instructions (Addendum)
 Please take over-the-counter Tylenol  alternate with ibuprofen  for baseline pain control.  You may use the nausea medication as needed.  Percocet as needed for breakthrough pain.  Your stone should pass due to its size.  You may follow-up with urology as needed.  Return if develop fevers, chills, worsening pain, inability to eat or drink due to nausea vomiting, you are unable to void or if you develop any new or worsening symptoms that are concerning to you.

## 2024-01-02 NOTE — ED Provider Notes (Signed)
 [place] EMERGENCY DEPARTMENT AT [facility] Provider Note   CSN: [id number] Arrival date & time: 01/02/24  0800     History  Chief Complaint  Patient presents with   Flank Pain    Norma [name] is a 33 y.o. female.  33 year old female presenting emergency department with sudden onset left flank pain that started this morning.  History of kidney stones feels similar.  Nauseated, no vomiting.  Nausea notes prior to arrival.  Reports some dysuria   Flank Pain       Home Medications Prior to Admission medications   Medication Sig Start Date End Date Taking? Authorizing Provider  acetaminophen  (TYLENOL ) 500 MG tablet Take 2 tablets (1,000 mg total) by mouth every 6 (six) hours. 09/26/18   Concepcion Deck, MD  albuterol  (PROVENTIL  HFA;VENTOLIN  HFA) 108 (90 Base) MCG/ACT inhaler Inhale 2 puffs into the lungs every 6 (six) hours as needed for wheezing or shortness of breath.    [provider]  amphetamine -dextroamphetamine  (ADDERALL XR) 30 MG 24 hr capsule Take 1 capsule (30 mg total) by mouth daily. 09/13/23   Mozingo, Regina Nattalie, NP  amphetamine -dextroamphetamine  (ADDERALL XR) 30 MG 24 hr capsule Take 1 capsule (30 mg total) by mouth daily. 10/11/23   Mozingo, Regina Nattalie, NP  amphetamine -dextroamphetamine  (ADDERALL XR) 30 MG 24 hr capsule Take 1 capsule (30 mg total) by mouth daily. 11/08/23   Mozingo, Regina Nattalie, NP  amphetamine -dextroamphetamine  (ADDERALL) 20 MG tablet Take 1 tablet (20 mg total) by mouth daily. 09/13/23   Mozingo, Regina Nattalie, NP  amphetamine -dextroamphetamine  (ADDERALL) 20 MG tablet Take 1 tablet (20 mg total) by mouth daily. 10/11/23   Mozingo, Regina Nattalie, NP  amphetamine -dextroamphetamine  (ADDERALL) 20 MG tablet Take 1 tablet (20 mg total) by mouth daily. 11/08/23   Mozingo, Regina Nattalie, NP  rizatriptan  (MAXALT -MLT) 10 MG disintegrating tablet Take 1 tablet (10 mg total) by mouth as needed for migraine. May  repeat in 2 hours if needed 08/26/23   Luevenia Saha, MD      Allergies    Cat dander    Review of Systems   Review of Systems  Genitourinary:  Positive for flank pain.    Physical Exam Updated Vital Signs BP (!) 108/50 (BP Location: Left Arm)   Pulse 83   Temp 98.1 F (36.7 C) (Oral)   Resp 18   LMP 09/02/2018 (Exact Date)   SpO2 99%  Physical Exam Vitals and nursing note reviewed.  Constitutional:      General: She is not in acute distress.    Appearance: She is not toxic-appearing.     Comments: Uncomfortable appearing  HENT:     Head: Normocephalic.  Eyes:     Conjunctiva/sclera: Conjunctivae normal.  Cardiovascular:     Rate and Rhythm: Normal rate and regular rhythm.  Pulmonary:     Effort: Pulmonary effort is normal.     Breath sounds: Normal breath sounds.  Abdominal:     General: Abdomen is flat. There is no distension.     Tenderness: There is no abdominal tenderness. There is left CVA tenderness. There is no guarding.  Skin:    General: Skin is warm and dry.     Capillary Refill: Capillary refill takes less than 2 seconds.  Neurological:     Mental Status: She is alert.  Psychiatric:        Mood and Affect: Mood normal.        Behavior: Behavior normal.     ED  Results / Procedures / Treatments   Labs (all labs ordered are listed, but only abnormal results are displayed) Labs Reviewed  BASIC METABOLIC PANEL WITH GFR - Abnormal; Notable for the following components:      Result Value   Glucose, Bld 123 (*)    All other components within normal limits  URINALYSIS, ROUTINE W REFLEX MICROSCOPIC - Abnormal; Notable for the following components:   Hgb urine dipstick MODERATE (*)    Protein, ur 30 (*)    All other components within normal limits  CBC    EKG None  Radiology CT Renal Stone Study Result Date: 01/02/2024 CLINICAL DATA:  Abdominal/flank pain, stone suspected EXAM: CT ABDOMEN AND PELVIS WITHOUT CONTRAST TECHNIQUE: Multidetector CT  imaging of the abdomen and pelvis was performed following the standard protocol without IV contrast. RADIATION DOSE REDUCTION: This exam was performed according to the departmental dose-optimization program which includes automated exposure control, adjustment of the mA and/or kV according to patient size and/or use of iterative reconstruction technique. COMPARISON:  May 26, 2020 FINDINGS: Of note, the lack of intravenous contrast limits evaluation of the solid organ parenchyma and vascularity. Lower chest: No focal airspace consolidation or pleural effusion. Hepatobiliary: No mass.No radiopaque stones or wall thickening of the gallbladder.No intrahepatic or extrahepatic biliary ductal dilation. Pancreas: No mass or main ductal dilation.No peripancreatic inflammation or fluid collection. Spleen: Normal size. No mass. Adrenals/Urinary Tract: No adrenal masses. No renal mass. Small nonobstructive bilateral calyceal calculi. 3 x 3 x 4 mm calculus in the mid left ureter causing mild hydroureteronephrosis. The urinary bladder is distended without focal abnormality. Stomach/Bowel:Small amount of fluid in the distal esophagus. The stomach contains ingested material without focal abnormality. No small bowel wall thickening or inflammation. No small bowel obstruction. Normal appendix. Vascular/Lymphatic: No aortic aneurysm. No intraabdominal or pelvic lymphadenopathy. Reproductive: Hysterectomy. No concerning adnexal mass.No free pelvic fluid. Other: No pneumoperitoneum, ascites, or mesenteric inflammation. Musculoskeletal: No acute fracture or destructive lesion. IMPRESSION: 1. Obstructive calculus in the mid left ureter, measuring 3 x 3 x 4 mm, causing mild hydroureteronephrosis. Small nonobstructive bilateral calyceal calculi also present. 2. Small amount of fluid in the distal esophagus, which may be due to incomplete emptying or gastroesophageal reflux. Electronically Signed   By: Rance Burrows M.D.   On:  01/02/2024 10:48    Procedures Procedures    Medications Ordered in ED Medications  ketorolac  (TORADOL ) 15 MG/ML injection 15 mg (15 mg Intravenous Given 01/02/24 0829)  ondansetron  (ZOFRAN ) injection 4 mg (4 mg Intravenous Given 01/02/24 0981)    ED Course/ Medical Decision Making/ A&P Clinical Course as of 01/02/24 1134  Mon Jan 02, 2024  0915 Creatinine: 0.72 [TY]  1028 Reevaluated; patient improved after medications.  She does not think she needs further pain medications at this time.  Awaiting UA for final disposition. [TY]  1028 CT Renal Stone Study Appears to have renal stone on the left-hand side.  No hydronephrosis on my independent interpretation.  Awaiting for radiology final overread. [TY]  1058 CT Renal Stone Study MPRESSION: 1. Obstructive calculus in the mid left ureter, measuring 3 x 3 x 4 mm, causing mild hydroureteronephrosis. Small nonobstructive bilateral calyceal calculi also present. 2. Small amount of fluid in the distal esophagus, which may be due to incomplete emptying or gastroesophageal reflux.   Electronically Signed   By: Rance Burrows M.D.   [TY]    Clinical Course User Index [TY] Rolinda Climes, DO  Medical Decision Making 33 year old female presenting emergency department with left flank pain.  History of prior kidney stones.  Uncomfortable appearing on exam.  Does have some CVA tenderness.  But benign abdominal exam.  Workup reassuring no leukocytosis.  No AKI.  No evidence of infection in your urine.  CT scan with stone.  Treated with Toradol  and Zofran  with improvement of symptoms.  Stable for discharge.  Follow-up with urology as needed.  Amount and/or Complexity of Data Reviewed External Data Reviewed:     Details: History of prior right ureteral stone Labs: ordered. Decision-making details documented in ED Course. Radiology: ordered. Decision-making details documented in ED  Course.  Risk Prescription drug management. Decision regarding hospitalization.         Final Clinical Impression(s) / ED Diagnoses Final diagnoses:  None    Rx / DC Orders ED Discharge Orders     None         Rolinda Climes, DO 01/02/24 1134

## 2024-01-03 ENCOUNTER — Other Ambulatory Visit: Payer: Self-pay | Admitting: Urology

## 2024-01-04 ENCOUNTER — Encounter (HOSPITAL_COMMUNITY): Payer: Self-pay | Admitting: Urology

## 2024-01-04 ENCOUNTER — Other Ambulatory Visit: Payer: Self-pay

## 2024-01-04 NOTE — Progress Notes (Addendum)
 COVID Vaccine Completed:  Date of COVID positive in last 90 days:  No  PCP - Karma Oz, MD Cardiologist - N/A  Chest x-ray - N/A EKG - N/A Stress Test - N/A ECHO - N/A Cardiac Cath - N/A Pacemaker/ICD device last checked: Spinal Cord Stimulator:N/A  Bowel Prep - N/A  Sleep Study - N/A CPAP -   Fasting Blood Sugar - N/A Checks Blood Sugar _____ times a day  Last dose of GLP1 agonist-  N/A GLP1 instructions:  Hold 7 days before surgery    Last dose of SGLT-2 inhibitors-  N/A SGLT-2 instructions:  Hold 3 days before surgery   Blood Thinner Instructions:  N/A Last Dose:  Activity level:  Can go up a flight of stairs and perform activities of daily living without stopping and without symptoms of chest pain or shortness of breath.  Anesthesia review: N/A  Patient denies shortness of breath, fever, cough and chest pain at PAT appointment (completed over the phone)  Patient verbalized understanding of instructions that were given to them at the PAT appointment. Patient was also instructed that they will need to review over the PAT instructions again at home before surgery.

## 2024-01-05 NOTE — Anesthesia Preprocedure Evaluation (Signed)
 Anesthesia Evaluation  Patient identified by MRN, date of birth, ID band Patient awake    Reviewed: Allergy & Precautions, NPO status , Patient's Chart, lab work & pertinent test results  History of Anesthesia Complications Negative for: history of anesthetic complications  Airway Mallampati: II  TM Distance: >3 FB Neck ROM: Full  Mouth opening: Limited Mouth Opening  Dental no notable dental hx. (+) Poor Dentition   Pulmonary asthma    Pulmonary exam normal breath sounds clear to auscultation       Cardiovascular (-) hypertension(-) angina (-) Past MI Normal cardiovascular exam Rhythm:Regular Rate:Normal     Neuro/Psych  Headaches PSYCHIATRIC DISORDERS Anxiety        GI/Hepatic   Endo/Other    Renal/GU Renal diseaseLab Results      Component                Value               Date                             K                        3.9                 01/02/2024                CO2                      23                  01/02/2024                BUN                      14                  01/02/2024                CREATININE               0.72                01/02/2024                \     Musculoskeletal negative musculoskeletal ROS (+)    Abdominal   Peds  Hematology  (+) Blood dyscrasia, anemia Lab Results      Component                Value               Date                      WBC                      7.6                 01/02/2024                HGB                      13.2                01/02/2024  HCT                      37.9                01/02/2024                MCV                      93.3                01/02/2024                PLT                      239                 01/02/2024              Anesthesia Other Findings   Reproductive/Obstetrics                             Anesthesia Physical Anesthesia Plan  ASA: 2  Anesthesia Plan: General    Post-op Pain Management: Toradol  IV (intra-op)* and Tylenol  PO (pre-op)*   Induction: Intravenous  PONV Risk Score and Plan: 4 or greater and Treatment may vary due to age or medical condition, Midazolam , Ondansetron  and Dexamethasone   Airway Management Planned: LMA  Additional Equipment: None  Intra-op Plan:   Post-operative Plan:   Informed Consent:      Dental advisory given  Plan Discussed with: CRNA and Surgeon  Anesthesia Plan Comments:        Anesthesia Quick Evaluation

## 2024-01-06 ENCOUNTER — Ambulatory Visit (HOSPITAL_BASED_OUTPATIENT_CLINIC_OR_DEPARTMENT_OTHER): Payer: Self-pay | Admitting: Anesthesiology

## 2024-01-06 ENCOUNTER — Other Ambulatory Visit: Payer: Self-pay | Admitting: Urology

## 2024-01-06 ENCOUNTER — Ambulatory Visit (HOSPITAL_COMMUNITY)

## 2024-01-06 ENCOUNTER — Ambulatory Visit (HOSPITAL_COMMUNITY)
Admission: RE | Admit: 2024-01-06 | Discharge: 2024-01-06 | Disposition: A | Discharge status: Home / Self Care | Attending: Urology | Admitting: Urology

## 2024-01-06 ENCOUNTER — Encounter (HOSPITAL_COMMUNITY): Payer: Self-pay | Admitting: Urology

## 2024-01-06 ENCOUNTER — Encounter (HOSPITAL_COMMUNITY)
Admission: RE | Disposition: A | Discharge status: Home / Self Care | Payer: Self-pay | Source: Home / Self Care | Attending: Urology

## 2024-01-06 ENCOUNTER — Ambulatory Visit (HOSPITAL_COMMUNITY): Payer: Self-pay | Admitting: Anesthesiology

## 2024-01-06 DIAGNOSIS — N132 Hydronephrosis with renal and ureteral calculous obstruction: Secondary | ICD-10-CM | POA: Diagnosis not present

## 2024-01-06 DIAGNOSIS — N202 Calculus of kidney with calculus of ureter: Secondary | ICD-10-CM

## 2024-01-06 DIAGNOSIS — Z87442 Personal history of urinary calculi: Secondary | ICD-10-CM | POA: Diagnosis not present

## 2024-01-06 DIAGNOSIS — J45909 Unspecified asthma, uncomplicated: Secondary | ICD-10-CM | POA: Diagnosis not present

## 2024-01-06 HISTORY — PX: CYSTOSCOPY/URETEROSCOPY/HOLMIUM LASER/STENT PLACEMENT: SHX6546

## 2024-01-06 HISTORY — DX: Anxiety disorder, unspecified: F41.9

## 2024-01-06 HISTORY — PX: CYSTOSCOPY W/ RETROGRADES: SHX1426

## 2024-01-06 HISTORY — DX: Migraine, unspecified, not intractable, without status migrainosus: G43.909

## 2024-01-06 HISTORY — DX: Personal history of urinary calculi: Z87.442

## 2024-01-06 SURGERY — CYSTOSCOPY/URETEROSCOPY/HOLMIUM LASER/STENT PLACEMENT
Anesthesia: General | Laterality: Bilateral

## 2024-01-06 MED ORDER — ORAL CARE MOUTH RINSE: 15.0000 mL | Freq: Once | OROMUCOSAL | Status: AC

## 2024-01-06 MED ORDER — LACTATED RINGERS IV SOLN: INTRAVENOUS | Status: DC

## 2024-01-06 MED ORDER — DEXAMETHASONE SODIUM PHOSPHATE 10 MG/ML IJ SOLN
INTRAMUSCULAR | Status: DC | PRN
  Administered 2024-01-06: 10 mg via INTRAVENOUS

## 2024-01-06 MED ORDER — FENTANYL CITRATE (PF) 100 MCG/2ML IJ SOLN
INTRAMUSCULAR | Status: AC
  Filled 2024-01-06: qty 2

## 2024-01-06 MED ORDER — DEXTROSE 5 % IV SOLN
360.0000 mg | INTRAVENOUS | Status: AC
  Administered 2024-01-06: 360 mg via INTRAVENOUS
  Filled 2024-01-06: qty 9

## 2024-01-06 MED ORDER — PROPOFOL 10 MG/ML IV BOLUS
INTRAVENOUS | Status: DC | PRN
  Administered 2024-01-06: 160 mg via INTRAVENOUS

## 2024-01-06 MED ORDER — LIDOCAINE HCL (PF) 2 % IJ SOLN
INTRAMUSCULAR | Status: AC
Start: 2024-01-06 — End: ?
  Filled 2024-01-06: qty 5

## 2024-01-06 MED ORDER — OXYCODONE HCL 5 MG/5ML PO SOLN: 5.0000 mg | Freq: Once | ORAL | Status: AC | PRN

## 2024-01-06 MED ORDER — ONDANSETRON HCL 4 MG/2ML IJ SOLN: 4.0000 mg | Freq: Once | INTRAMUSCULAR | Status: DC | PRN

## 2024-01-06 MED ORDER — CEPHALEXIN 500 MG PO CAPS
500.0000 mg | ORAL_CAPSULE | Freq: Two times a day (BID) | ORAL | 0 refills | Status: DC
Start: 2024-01-06 — End: 2024-01-25

## 2024-01-06 MED ORDER — OXYBUTYNIN CHLORIDE 5 MG PO TABS: 5.0000 mg | ORAL_TABLET | Freq: Three times a day (TID) | ORAL | 0 refills | Status: DC | PRN

## 2024-01-06 MED ORDER — LIDOCAINE HCL (CARDIAC) PF 100 MG/5ML IV SOSY
PREFILLED_SYRINGE | INTRAVENOUS | Status: DC | PRN
  Administered 2024-01-06: 100 mg via INTRAVENOUS

## 2024-01-06 MED ORDER — HYDROMORPHONE HCL 1 MG/ML IJ SOLN
INTRAMUSCULAR | Status: AC
  Filled 2024-01-06: qty 1

## 2024-01-06 MED ORDER — OXYCODONE-ACETAMINOPHEN 5-325 MG PO TABS: 1.0000 | ORAL_TABLET | Freq: Four times a day (QID) | ORAL | 0 refills | Status: DC | PRN

## 2024-01-06 MED ORDER — KETOROLAC TROMETHAMINE 30 MG/ML IJ SOLN
INTRAMUSCULAR | Status: DC | PRN
  Administered 2024-01-06: 30 mg via INTRAVENOUS

## 2024-01-06 MED ORDER — SODIUM CHLORIDE 0.9 % IR SOLN
Status: DC | PRN
  Administered 2024-01-06: 3000 mL via INTRAVESICAL

## 2024-01-06 MED ORDER — ACETAMINOPHEN 10 MG/ML IV SOLN
INTRAVENOUS | Status: DC | PRN
  Administered 2024-01-06: 1000 mg via INTRAVENOUS

## 2024-01-06 MED ORDER — CHLORHEXIDINE GLUCONATE 0.12 % MT SOLN
15.0000 mL | Freq: Once | OROMUCOSAL | Status: AC
  Administered 2024-01-06: 15 mL via OROMUCOSAL

## 2024-01-06 MED ORDER — HYDROMORPHONE HCL 1 MG/ML IJ SOLN
0.2500 mg | INTRAMUSCULAR | Status: DC | PRN
  Administered 2024-01-06 (×4): 0.5 mg via INTRAVENOUS

## 2024-01-06 MED ORDER — KETOROLAC TROMETHAMINE 30 MG/ML IJ SOLN
INTRAMUSCULAR | Status: AC
  Filled 2024-01-06: qty 1

## 2024-01-06 MED ORDER — SENNOSIDES-DOCUSATE SODIUM 8.6-50 MG PO TABS: 1.0000 | ORAL_TABLET | Freq: Two times a day (BID) | ORAL | 0 refills | Status: AC

## 2024-01-06 MED ORDER — MIDAZOLAM HCL 2 MG/2ML IJ SOLN
INTRAMUSCULAR | Status: DC | PRN
  Administered 2024-01-06: 2 mg via INTRAVENOUS

## 2024-01-06 MED ORDER — KETOROLAC TROMETHAMINE 30 MG/ML IJ SOLN: 30.0000 mg | Freq: Once | INTRAMUSCULAR | Status: DC | PRN

## 2024-01-06 MED ORDER — ACETAMINOPHEN 10 MG/ML IV SOLN
INTRAVENOUS | Status: AC
  Filled 2024-01-06: qty 100

## 2024-01-06 MED ORDER — PROPOFOL 10 MG/ML IV BOLUS
INTRAVENOUS | Status: AC
  Filled 2024-01-06: qty 20

## 2024-01-06 MED ORDER — ONDANSETRON HCL 4 MG/2ML IJ SOLN
INTRAMUSCULAR | Status: AC
  Filled 2024-01-06: qty 2

## 2024-01-06 MED ORDER — ONDANSETRON HCL 4 MG/2ML IJ SOLN
INTRAMUSCULAR | Status: DC | PRN
  Administered 2024-01-06: 4 mg via INTRAVENOUS

## 2024-01-06 MED ORDER — KETOROLAC TROMETHAMINE 10 MG PO TABS: 10.0000 mg | ORAL_TABLET | Freq: Three times a day (TID) | ORAL | 0 refills | Status: DC | PRN

## 2024-01-06 MED ORDER — MIDAZOLAM HCL 2 MG/2ML IJ SOLN
INTRAMUSCULAR | Status: AC
  Filled 2024-01-06: qty 2

## 2024-01-06 MED ORDER — OXYCODONE HCL 5 MG PO TABS
5.0000 mg | ORAL_TABLET | Freq: Once | ORAL | Status: AC | PRN
  Administered 2024-01-06: 5 mg via ORAL

## 2024-01-06 MED ORDER — FENTANYL CITRATE (PF) 100 MCG/2ML IJ SOLN
INTRAMUSCULAR | Status: DC | PRN
Start: 2024-01-06 — End: 2024-01-06
  Administered 2024-01-06: 25 ug via INTRAVENOUS
  Administered 2024-01-06: 50 ug via INTRAVENOUS
  Administered 2024-01-06: 25 ug via INTRAVENOUS

## 2024-01-06 MED ORDER — DEXAMETHASONE SODIUM PHOSPHATE 10 MG/ML IJ SOLN
INTRAMUSCULAR | Status: AC
  Filled 2024-01-06: qty 1

## 2024-01-06 MED ORDER — PHENAZOPYRIDINE HCL 200 MG PO TABS: 200.0000 mg | ORAL_TABLET | Freq: Three times a day (TID) | ORAL | 0 refills | Status: DC | PRN

## 2024-01-06 MED ORDER — OXYCODONE HCL 5 MG PO TABS
ORAL_TABLET | ORAL | Status: AC
  Filled 2024-01-06: qty 1

## 2024-01-06 MED ORDER — IOHEXOL 300 MG/ML  SOLN
INTRAMUSCULAR | Status: DC | PRN
  Administered 2024-01-06: 29 mL

## 2024-01-06 SURGICAL SUPPLY — 22 items
BAG URO CATCHER STRL LF (MISCELLANEOUS) ×1 IMPLANT
BASKET LASER NITINOL 1.9FR (BASKET) IMPLANT
CATH URETL OPEN END 6FR 70 (CATHETERS) ×1 IMPLANT
CLOTH BEACON ORANGE TIMEOUT ST (SAFETY) ×1 IMPLANT
EXTRACTOR STONE 1.7FRX115CM (UROLOGICAL SUPPLIES) IMPLANT
GLOVE SURG LX STRL 7.5 STRW (GLOVE) ×1 IMPLANT
GOWN STRL REUS W/ TWL XL LVL3 (GOWN DISPOSABLE) ×1 IMPLANT
GUIDEWIRE ANG ZIPWIRE 038X150 (WIRE) ×1 IMPLANT
GUIDEWIRE STR DUAL SENSOR (WIRE) ×1 IMPLANT
KIT TURNOVER KIT A (KITS) IMPLANT
LASER FIB FLEXIVA PULSE ID 365 (Laser) IMPLANT
MANIFOLD NEPTUNE II (INSTRUMENTS) ×1 IMPLANT
NS IRRIG 1000ML POUR BTL (IV SOLUTION) IMPLANT
PACK CYSTO (CUSTOM PROCEDURE TRAY) ×1 IMPLANT
SHEATH NAVIGATOR HD 11/13X28 (SHEATH) IMPLANT
SHEATH NAVIGATOR HD 11/13X36 (SHEATH) IMPLANT
STENT URET 6FRX26 CONTOUR (STENTS) IMPLANT
SUT ETHILON 4 0 PS 2 18 (SUTURE) IMPLANT
TRACTIP FLEXIVA PULS ID 200XHI (Laser) IMPLANT
TUBE PU 8FR 16IN ENFIT (TUBING) ×1 IMPLANT
TUBING CONNECTING 10 (TUBING) ×1 IMPLANT
TUBING UROLOGY SET (TUBING) ×1 IMPLANT

## 2024-01-06 NOTE — Anesthesia Procedure Notes (Signed)
 Procedure Name: LMA Insertion Date/Time: 01/06/2024 11:14 AM  Performed by: Vella Gey, CRNAPatient Re-evaluated:Patient Re-evaluated prior to induction Oxygen Delivery Method: Circle system utilized Preoxygenation: Pre-oxygenation with 100% oxygen Induction Type: IV induction LMA: LMA inserted LMA Size: 4.0 Number of attempts: 1 Placement Confirmation: positive ETCO2 and breath sounds checked- equal and bilateral Tube secured with: Tape Dental Injury: Teeth and Oropharynx as per pre-operative assessment

## 2024-01-06 NOTE — H&P (Signed)
 Norma [name] is an 33 y.o. female.    Chief Complaint: Pre-Op BILATERAL Ureteroscopic Stone Manipulation  HPI:   1 - Recurrent Urolithiasis -  Pre 2025 - MET and URS x several  12/2023 - 4mm left mid ureteral stone and bilateral small renal stones on ER CT. Stone at [employer] tranverse process level. Visible on KUB.   PMH sig for vag hyst (benign) Her PCP is Karma Oz MD. 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   Today "Jeryl" is seen to proceed with BILATERAL ureteroscopy with goal of stone free for left ureteral / bilateral renal stones.   Past Medical History:  Diagnosis Date   Anemia    Anxiety    Asthma    exercise and allergy induced   Chronic allergic rhinitis 05/07/2019   Dysmenorrhea    Endometriosis 08/26/2023   Pt reported; found during partial hysterectomy    History of kidney stones    Migraine headache    Right ureteral stone     Past Surgical History:  Procedure Laterality Date   ADENOIDECTOMY     ANTERIOR AND POSTERIOR REPAIR WITH SACROSPINOUS FIXATION N/A 09/25/2018   Procedure: ANTERIOR AND POSTERIOR REPAIR, [facility] CALLS;  Surgeon: Concepcion Deck, MD;  Location: [facility];  Service: Gynecology;  Laterality: N/A;   CYSTOSCOPY N/A 09/25/2018   Procedure: CYSTOSCOPY;  Surgeon: Concepcion Deck, MD;  Location: [facility];  Service: Gynecology;  Laterality: N/A;   CYSTOSCOPY WITH RETROGRADE PYELOGRAM, URETEROSCOPY AND STENT PLACEMENT Left 05/29/2020   Procedure: CYSTOSCOPY WITH LEFT  RETROGRADE URETEROSCOPY AND STENT PLACEMENT;  Surgeon: Homero Luster, MD;  Location: [facility];  Service: Urology;  Laterality: Left;   DILATION AND CURETTAGE OF UTERUS N/A 03/30/2018   Procedure: DILATATION AND EVACUATION;  Surgeon: Woodrow Hazy, MD;  Location: [facility];  Service: Gynecology;  Laterality: N/A;   TONSILLECTOMY     VAGINAL HYSTERECTOMY Bilateral 09/25/2018   Procedure: VAGINAL HYSTERECTOMY, BILATERAL FIMBRECTOMY;  Surgeon: Concepcion Deck, MD;  Location: [facility];  Service:  Gynecology;  Laterality: Bilateral;   VAGINAL HYSTERECTOMY     WISDOM TOOTH EXTRACTION      Family History  Problem Relation Age of Onset   Diabetes Mother    Hypertension Mother    Healthy Daughter    Social History:  reports that she has never smoked. She has never used smokeless tobacco. She reports current alcohol use. She reports that she does not use drugs.  Allergies:  Allergies  Allergen Reactions   Cat Dander Hives and Rash    No medications prior to admission.    No results found for this or any previous visit (from the past 48 hours). No results found.  Review of Systems  Constitutional:  Negative for fever.  Genitourinary:  Positive for flank pain.    Height 5\' 7"  (1.702 m), weight 74.8 kg, last menstrual period 09/02/2018. Physical Exam Vitals reviewed.  HENT:     Head: Normocephalic.  Eyes:     Pupils: Pupils are equal, round, and reactive to light.  Cardiovascular:     Rate and Rhythm: Normal rate.  Pulmonary:     Effort: Pulmonary effort is normal.  Abdominal:     General: Abdomen is flat.  Genitourinary:    Comments: Mild left CVAT Musculoskeletal:        General: Normal range of motion.     Cervical back: Normal range of motion.  Skin:    General: Skin is warm.  Neurological:  General: No focal deficit present.     Mental Status: She is alert.  Psychiatric:        Mood and Affect: Mood normal.      Assessment/Plan  Proceed as planned with BILATERAL ureteroscopic stone manipulation. Risks, benefits, alternatives, expected peri-op course discussed previously and reiterated today.   Melody Spurling., MD 01/06/2024, 6:43 AM

## 2024-01-06 NOTE — Anesthesia Postprocedure Evaluation (Signed)
 Anesthesia Post Note  Patient: Norma [name]  Procedure(s) Performed: CYSTOSCOPY/URETEROSCOPY/HOLMIUM LASER/STENT PLACEMENT (Bilateral) CYSTOSCOPY, WITH RETROGRADE PYELOGRAM (Bilateral)     Patient location during evaluation: PACU Anesthesia Type: General Level of consciousness: awake and alert Pain management: pain level controlled Vital Signs Assessment: post-procedure vital signs reviewed and stable Respiratory status: spontaneous breathing, nonlabored ventilation, respiratory function stable and patient connected to nasal cannula oxygen Cardiovascular status: blood pressure returned to baseline and stable Postop Assessment: no apparent nausea or vomiting Anesthetic complications: no  No notable events documented.  Last Vitals:  Vitals:   01/06/24 1400 01/06/24 1415  BP: 125/79   Pulse: 72 77  Resp: 19 16  Temp:    SpO2: 98% 96%    Last Pain:  Vitals:   01/06/24 1415  TempSrc:   PainSc: Asleep                 Rosalita Combe

## 2024-01-06 NOTE — Brief Op Note (Signed)
 01/06/2024  12:32 PM  PATIENT:  Norma [name]  33 y.o. female  PRE-OPERATIVE DIAGNOSIS:  BILATERAL RENAL AND URETERAL STONES  POST-OPERATIVE DIAGNOSIS:  BILATERAL RENAL AND URETERAL STONES  PROCEDURE:  Procedure(s): CYSTOSCOPY/URETEROSCOPY/HOLMIUM LASER/STENT PLACEMENT (Bilateral) CYSTOSCOPY, WITH RETROGRADE PYELOGRAM (Bilateral)  SURGEON:  Surgeons and Role:    * Manny, Harvey Linen., MD - Primary  PHYSICIAN ASSISTANT:   ASSISTANTS: none   ANESTHESIA:   general  EBL:  minmal   BLOOD ADMINISTERED:none  DRAINS: none   LOCAL MEDICATIONS USED:  NONE  SPECIMEN:  Source of Specimen:  bilateral renal / ureteral stones  DISPOSITION OF SPECIMEN:   [facility] for compositional analysis  COUNTS:  YES  TOURNIQUET:  * No tourniquets in log *  DICTATION: .Other Dictation: Dictation Number [id number]  PLAN OF CARE: Discharge to home after PACU  PATIENT DISPOSITION:  PACU - hemodynamically stable.   Delay start of Pharmacological VTE agent (>24hrs) due to surgical blood loss or risk of bleeding: not applicable

## 2024-01-06 NOTE — Transfer of Care (Signed)
 Immediate Anesthesia Transfer of Care Note  Patient: Norma [name]  Procedure(s) Performed: CYSTOSCOPY/URETEROSCOPY/HOLMIUM LASER/STENT PLACEMENT (Bilateral) CYSTOSCOPY, WITH RETROGRADE PYELOGRAM (Bilateral)  Patient Location: PACU  Anesthesia Type:General  Level of Consciousness: drowsy  Airway & Oxygen Therapy: Patient Spontanous Breathing and Patient connected to face mask oxygen  Post-op Assessment: Report given to RN and Post -op Vital signs reviewed and stable  Post vital signs: Reviewed and stable  Last Vitals:  Vitals Value Taken Time  BP 119/78 01/06/24 1241  Temp    Pulse 85 01/06/24 1242  Resp 13 01/06/24 1242  SpO2 100 % 01/06/24 1242  Vitals shown include unfiled device data.  Last Pain:  Vitals:   01/06/24 1032  TempSrc:   PainSc: 0-No pain         Complications: No notable events documented.

## 2024-01-06 NOTE — OR Nursing (Signed)
 Received call from patient's spouse that pt is in severe pain.  He is asking if patient can double up on her pain meds or if something stronger can be prescribed.  Informed caller that RN cannot advise patient to double up on pain meds or prescribe other medications.  Per discharge instructions patient will need to call surgeon (number provided from discharge instructions) or go to emergency department.  Pt's husband verbalized understanding and will call surgeon's office and speak with on call provider.

## 2024-01-06 NOTE — Discharge Instructions (Addendum)
 1 - You may have urinary urgency (bladder spasms) and bloody urine on / off with stent in place. This is normal.  2 - Removed tethered stents on Monday morning at home by pulling on strings, then blue plastic tubing, and discarding. There are TWO stents tied together. Office is open Monday if any issues arise.   3 - Call MD or go to ER for fever >102, severe pain / nausea / vomiting not relieved by medications, or acute change in medical status

## 2024-01-07 ENCOUNTER — Emergency Department (HOSPITAL_BASED_OUTPATIENT_CLINIC_OR_DEPARTMENT_OTHER)

## 2024-01-07 ENCOUNTER — Encounter (HOSPITAL_COMMUNITY): Payer: Self-pay | Admitting: Urology

## 2024-01-07 ENCOUNTER — Emergency Department (HOSPITAL_BASED_OUTPATIENT_CLINIC_OR_DEPARTMENT_OTHER)
Admission: EM | Admit: 2024-01-07 | Discharge: 2024-01-07 | Disposition: A | Discharge status: Home / Self Care | Attending: Emergency Medicine | Admitting: Emergency Medicine

## 2024-01-07 ENCOUNTER — Other Ambulatory Visit: Payer: Self-pay

## 2024-01-07 DIAGNOSIS — J45909 Unspecified asthma, uncomplicated: Secondary | ICD-10-CM | POA: Diagnosis not present

## 2024-01-07 DIAGNOSIS — R109 Unspecified abdominal pain: Secondary | ICD-10-CM | POA: Diagnosis present

## 2024-01-07 DIAGNOSIS — G8918 Other acute postprocedural pain: Secondary | ICD-10-CM | POA: Diagnosis not present

## 2024-01-07 DIAGNOSIS — R110 Nausea: Secondary | ICD-10-CM | POA: Insufficient documentation

## 2024-01-07 DIAGNOSIS — D72829 Elevated white blood cell count, unspecified: Secondary | ICD-10-CM | POA: Diagnosis not present

## 2024-01-07 LAB — CBC WITH DIFFERENTIAL/PLATELET
Abs Immature Granulocytes: 0.04 10*3/uL (ref 0.00–0.07)
Basophils Absolute: 0 10*3/uL (ref 0.0–0.1)
Basophils Relative: 0 %
Eosinophils Absolute: 0 10*3/uL (ref 0.0–0.5)
Eosinophils Relative: 0 %
HCT: 38.7 % (ref 36.0–46.0)
Hemoglobin: 13.3 g/dL (ref 12.0–15.0)
Immature Granulocytes: 0 %
Lymphocytes Relative: 14 %
Lymphs Abs: 1.5 10*3/uL (ref 0.7–4.0)
MCH: 32.4 pg (ref 26.0–34.0)
MCHC: 34.4 g/dL (ref 30.0–36.0)
MCV: 94.4 fL (ref 80.0–100.0)
Monocytes Absolute: 0.7 10*3/uL (ref 0.1–1.0)
Monocytes Relative: 6 %
Neutro Abs: 9 10*3/uL — ABNORMAL HIGH (ref 1.7–7.7)
Neutrophils Relative %: 80 %
Platelets: 246 10*3/uL (ref 150–400)
RBC: 4.1 MIL/uL (ref 3.87–5.11)
RDW: 11.7 % (ref 11.5–15.5)
WBC: 11.3 10*3/uL — ABNORMAL HIGH (ref 4.0–10.5)
nRBC: 0 % (ref 0.0–0.2)

## 2024-01-07 LAB — COMPREHENSIVE METABOLIC PANEL WITH GFR
ALT: 28 U/L (ref 0–44)
AST: 24 U/L (ref 15–41)
Albumin: 4.6 g/dL (ref 3.5–5.0)
Alkaline Phosphatase: 66 U/L (ref 38–126)
Anion gap: 13 (ref 5–15)
BUN: 13 mg/dL (ref 6–20)
CO2: 21 mmol/L — ABNORMAL LOW (ref 22–32)
Calcium: 10.1 mg/dL (ref 8.9–10.3)
Chloride: 102 mmol/L (ref 98–111)
Creatinine, Ser: 0.83 mg/dL (ref 0.44–1.00)
GFR, Estimated: >60 mL/min (ref >60)
Glucose, Bld: 115 mg/dL — ABNORMAL HIGH (ref 70–99)
Potassium: 4.4 mmol/L (ref 3.5–5.1)
Sodium: 135 mmol/L (ref 135–145)
Total Bilirubin: 0.3 mg/dL (ref 0.0–1.2)
Total Protein: 7.1 g/dL (ref 6.5–8.1)

## 2024-01-07 LAB — URINALYSIS, ROUTINE W REFLEX MICROSCOPIC
Glucose, UA: NEGATIVE mg/dL
Ketones, ur: NEGATIVE mg/dL
Nitrite: POSITIVE — AB
Protein, ur: 100 mg/dL — AB
RBC / HPF: >50 RBC/hpf (ref 0–5)
Specific Gravity, Urine: 1.02 (ref 1.005–1.030)
pH: 6.5 (ref 5.0–8.0)

## 2024-01-07 MED ORDER — HYDROMORPHONE HCL 1 MG/ML IJ SOLN
1.0000 mg | Freq: Once | INTRAMUSCULAR | Status: AC
  Administered 2024-01-07: 1 mg via INTRAVENOUS
  Filled 2024-01-07: qty 1

## 2024-01-07 MED ORDER — METOCLOPRAMIDE HCL 5 MG/ML IJ SOLN
10.0000 mg | Freq: Once | INTRAMUSCULAR | Status: AC
  Administered 2024-01-07: 10 mg via INTRAVENOUS
  Filled 2024-01-07: qty 2

## 2024-01-07 MED ORDER — OXYBUTYNIN CHLORIDE 5 MG PO TABS: 5.0000 mg | ORAL_TABLET | Freq: Three times a day (TID) | ORAL | 0 refills | Status: DC | PRN

## 2024-01-07 MED ORDER — KETOROLAC TROMETHAMINE 15 MG/ML IJ SOLN
15.0000 mg | Freq: Once | INTRAMUSCULAR | Status: AC
  Administered 2024-01-07: 15 mg via INTRAVENOUS
  Filled 2024-01-07: qty 1

## 2024-01-07 MED ORDER — OXYBUTYNIN CHLORIDE 5 MG PO TABS
5.0000 mg | ORAL_TABLET | Freq: Once | ORAL | Status: AC
Start: 2024-01-07 — End: 2024-01-07
  Administered 2024-01-07: 5 mg via ORAL
  Filled 2024-01-07: qty 1

## 2024-01-07 NOTE — ED Notes (Signed)
 Patient to CT via stretcher.

## 2024-01-07 NOTE — Op Note (Signed)
 NAMEONEIKA, EXNER MEDICAL RECORD NO: [id number] ACCOUNT NO: [id number] DATE OF BIRTH: 01-May-1991 FACILITY: [facility] LOCATION: [facility] PHYSICIAN: Osborn Blaze, MD  Operative Report   DATE OF PROCEDURE: 01/06/2024  PREOPERATIVE DIAGNOSES:  Left ureteral and bilateral renal stones.  PROCEDURES PERFORMED:  1.  Cystoscopy with bilateral retrograde pyelograms with interpretation. 2.  Bilateral ureteroscopy with laser lithotripsy. 3.  Placement of bilateral ureteral stents.  ESTIMATED BLOOD LOSS:  Nil.  COMPLICATIONS:  None.  SPECIMENS:  Bilateral renal and ureteral stones fragments for composition analysis.  FINDINGS: 1.  Left mid ureteral stone with mild hydronephrosis. 2.  Bilateral renal stones. 3.  Complete resolution of all accessible stone fragments larger than one-third mm following laser lithotripsy and basket extraction. 4.  Successful placement of bilateral ureteral stents, proximal end in renal pelvis and distal end in the urinary bladder with tethers, fashioned together.  INDICATIONS:  The patient is a pleasant 33 year old lady with a history of recurrent urolithiasis.  She was found on workup for colicky flank pain from a left midureteral stone as well as significant volume bilateral renal stones.  Options were discussed  for management options including medical therapy versus extracorporeal shock wave lithotripsy, and ureteroscopy, unilateral versus bilateral and she wished to proceed with bilateral ureteroscopy with the goal of becoming stone-free.  She presents for  this today.  Informed consent was obtained and placed in the medical record.  DESCRIPTION OF PROCEDURE:  The patient being Zarina Rajewski verified and the procedure being bilateral ureteroscopic stone manipulation was confirmed.  Procedure timeout was performed.  Intravenous antibiotics administered, general anesthesia induced.  The  patient was placed into a low lithotomy position.  Sterile field was created,  prepped and draped the patient's vagina, introitus and proximal thighs using iodine.  Cystourethroscopy was performed using 21-French rigid cystoscope with offset lens.   Inspection of the bladder revealed no diverticula, calcifications, or papillary lesions.  The right ureteral orifice cannulated with 6-French end-hole catheter and right retrograde pyelogram was obtained.  Right retrograde pyelogram demonstrated a single right ureter and single system right kidney.  No filling defects or narrowing noted.  A ZIPwire was advanced to the level of the lower pole, set aside as a safety wire.   Next, left retrograde pyelogram was obtained.  Left retrograde pyelogram demonstrated a single left ureter with single-system left kidney.  There was a filling defect in the mid ureter consistent with known stone.  There was mild hydronephrosis above this.   A 0.035 ZIPwire was advanced to the level of the lower pole and set aside as a safety wire.  An 8-French feeding tube was placed in the urinary bladder for pressure release.  Next, semirigid ureteroscopy was performed of the distal four-fifths of the  right ureter alongside a separate sensor working wire.  No mucosal abnormalities were found.  Next, semirigid ureteroscopy was performed of distal left ureter alongside a separate sensor working wire.  In the mid ureter, as anticipated, there was a  spiculated ureteral stone.  There was some mild edema around this.  This was retrograde positioned with flushing up towards the renal pelvis, and the semirigid ureteroscope was exchanged for a short-length ureteral access sheath at the level of the  proximal ureter using continuous fluoroscopic guidance and flexible digital ureteroscopy was performed of the proximal left ureter and systematic inspection of the left kidney.  The ureteral stone was retrograde repositioned into the upper pole of the  calyx.  There were multiple other papillary-type  calcifications as well.   Holmium laser energy was then applied to the dominant stone using setting of 0.2 joules and 20 Hz.  It was fragmented into 3 smaller pieces, which were then amenable to simple  basket.  Multiple papillary tip calcifications were also controlled using basket extraction.  Thus, it appeared that all fragments larger than one-third mm had been removed from the left kidney.  Next, the access sheath was placed over the right sensor  working wire at the level of the proximal right ureter using continuous fluoroscopic guidance.  Flexible digital ureteroscopy was performed of the proximal right ureter and systematic inspection of the right kidney. Multifocal papillary calcifications  were noted in all calyces.  The vast majority of these were amenable to simple basketing with the Escape basket.  Several of these were somewhat submucosal and ablated using holmium laser energy, again at a setting of 0.2 joules and 20 Hz.  Following  this, there was complete resolution of all accessible stones fragments on the right side larger than one-third mm and access sheath was removed under continuous vision.  No significant mucosal abnormalities were found.  Given the bilateral nature of the  procedure, it was felt that brief interval stenting with tethered stents would be most prudent.  As such, new 6 x 26 cm contour-type stents were carefully placed over the remaining safety wires bilaterally using fluoroscopic guidance.  The distal curls  had been removed to lessen bladder sensitivity.  Tethers were tied together, trimmed to length, and tucked per vagina and the procedure was terminated.  The patient tolerated the procedure well.  There were no immediate periprocedural complications.  The  patient was taken to the Postanesthesia Care Unit in stable condition.  Plan for discharge home.   MUK D: 01/06/2024 12:37:30 pm T: 01/07/2024 12:16:00 am  JOB: [id number]/ [id number]

## 2024-01-07 NOTE — ED Provider Notes (Signed)
  EMERGENCY DEPARTMENT AT [facility] Provider Note   CSN: [id number] Arrival date & time: 01/07/24  4098     History  Chief Complaint  Patient presents with   Abdominal Pain    Pt coming in POV with bilateral flank pain 8/10 that wraps around the abdomen. Pt had kidney stones removed yesterday morning and had 2 stents placed. Pt endorses nausea and chills   Flank Pain    Norma [name] is a 33 y.o. female.  Patient is a 33 year old female with a significant past medical history of asthma, migraines, endometriosis and recurrent renal stones who had a cystoscopy with bilateral lithotripsy and stent placement yesterday and is presenting today with persistent uncontrollable pain.  She reports the pain has been between 7 and 10 since she has gotten home and despite trying to take oxycodone  and Toradol  the pain has not been controlled.  She reports the pain is always at a 7 but then sometimes will increase to a 10 in waves.  She feels it down in the suprapubic area but also in bilateral flank areas.  She has had nausea but no vomiting.  She has been able to have a bowel movement.  She denies any fevers.  She called the urologist last night and they sent her a prescription for Pyridium and did your pan however the pharmacy did not have any Ditropan so gave her the Pyridium but she does not feel that it has helped.  The history is provided by the patient, the spouse and medical records.  Abdominal Pain Flank Pain Associated symptoms include abdominal pain.       Home Medications Prior to Admission medications   Medication Sig Start Date End Date Taking? Authorizing Provider  phenazopyridine (PYRIDIUM) 200 MG tablet Take 1 tablet (200 mg total) by mouth 3 (three) times daily as needed for pain. 01/06/24   Stoneking, Ponce Brisker., MD  amphetamine -dextroamphetamine  (ADDERALL XR) 30 MG 24 hr capsule Take 1 capsule (30 mg total) by mouth daily. 11/08/23   Mozingo, Regina Nattalie,  NP  cephALEXin (KEFLEX) 500 MG capsule Take 1 capsule (500 mg total) by mouth 2 (two) times daily. X 3 days to prevent infection with tethered stents. 01/06/24   Melody Spurling., MD  cholecalciferol (VITAMIN D3) 25 MCG (1000 UNIT) tablet Take 1,000 Units by mouth daily.    [provider]  Cyanocobalamin  (VITAMIN B-12) 2500 MCG SUBL Place 2,500 mcg under the tongue daily.    [provider]  ketorolac  (TORADOL ) 10 MG tablet Take 1 tablet (10 mg total) by mouth every 8 (eight) hours as needed for moderate pain (pain score 4-6) (or stent discomfort post-operatively). 01/06/24   Melody Spurling., MD  ondansetron  (ZOFRAN -ODT) 4 MG disintegrating tablet Take 1 tablet (4 mg total) by mouth every 8 (eight) hours as needed for nausea or vomiting. 01/02/24   Rolinda Climes, DO  oxybutynin (DITROPAN) 5 MG tablet Take 1 tablet (5 mg total) by mouth every 8 (eight) hours as needed for bladder spasms. 01/07/24   Almond Army, MD  oxyCODONE -acetaminophen  (PERCOCET/ROXICET) 5-325 MG tablet Take 1 tablet by mouth every 6 (six) hours as needed for severe pain (pain score 7-10) (post-operatively). 01/06/24   Melody Spurling., MD  rizatriptan  (MAXALT -MLT) 10 MG disintegrating tablet Take 1 tablet (10 mg total) by mouth as needed for migraine. May repeat in 2 hours if needed 08/26/23   Luevenia Saha, MD  senna-docusate (SENOKOT-S) 8.6-50 MG tablet  Take 1 tablet by mouth 2 (two) times daily. While taking strongest pain meds to prevent constipation. 01/06/24   Melody Spurling., MD      Allergies    Cat dander    Review of Systems   Review of Systems  Gastrointestinal:  Positive for abdominal pain.  Genitourinary:  Positive for flank pain.    Physical Exam Updated Vital Signs BP 115/81   Pulse 78   Temp 98.6 F (37 C) (Oral)   Resp 20   Ht 5\' 7"  (1.702 m)   Wt 75 kg   LMP 09/02/2018 (Exact Date)   SpO2 94%   BMI 25.90 kg/m  Physical Exam Vitals and nursing note  reviewed.  Constitutional:      General: She is not in acute distress.    Appearance: She is well-developed.  HENT:     Head: Normocephalic and atraumatic.  Eyes:     Pupils: Pupils are equal, round, and reactive to light.  Cardiovascular:     Rate and Rhythm: Normal rate and regular rhythm.     Heart sounds: Normal heart sounds. No murmur heard.    No friction rub.  Pulmonary:     Effort: Pulmonary effort is normal.     Breath sounds: Normal breath sounds. No wheezing or rales.  Abdominal:     General: Bowel sounds are normal. There is no distension.     Palpations: Abdomen is soft.     Tenderness: There is abdominal tenderness in the suprapubic area. There is no right CVA tenderness, left CVA tenderness, guarding or rebound.  Musculoskeletal:        General: No tenderness. Normal range of motion.     Comments: No edema  Skin:    General: Skin is warm and dry.     Findings: No rash.  Neurological:     Mental Status: She is alert and oriented to person, place, and time.     Cranial Nerves: No cranial nerve deficit.  Psychiatric:        Behavior: Behavior normal.     ED Results / Procedures / Treatments   Labs (all labs ordered are listed, but only abnormal results are displayed) Labs Reviewed  CBC WITH DIFFERENTIAL/PLATELET - Abnormal; Notable for the following components:      Result Value   WBC 11.3 (*)    Neutro Abs 9.0 (*)    All other components within normal limits  COMPREHENSIVE METABOLIC PANEL WITH GFR - Abnormal; Notable for the following components:   CO2 21 (*)    Glucose, Bld 115 (*)    All other components within normal limits  URINALYSIS, ROUTINE W REFLEX MICROSCOPIC - Abnormal; Notable for the following components:   Color, Urine ORANGE (*)    APPearance CLOUDY (*)    Hgb urine dipstick LARGE (*)    Bilirubin Urine SMALL (*)    Protein, ur 100 (*)    Nitrite POSITIVE (*)    Leukocytes,Ua SMALL (*)    Bacteria, UA RARE (*)    All other components  within normal limits    EKG None  Radiology CT Renal Stone Study Result Date: 01/07/2024 CLINICAL DATA:  Abdominal/flank pain with stone suspected EXAM: CT ABDOMEN AND PELVIS WITHOUT CONTRAST TECHNIQUE: Multidetector CT imaging of the abdomen and pelvis was performed following the standard protocol without IV contrast. RADIATION DOSE REDUCTION: This exam was performed according to the departmental dose-optimization program which includes automated exposure control, adjustment of the mA and/or kV  according to patient size and/or use of iterative reconstruction technique. COMPARISON:  Five days prior FINDINGS: Lower chest:  Abdominal/flank pain with stone suspected Hepatobiliary: No focal liver abnormality.No evidence of biliary obstruction or stone. Pancreas: Unremarkable. Spleen: Unremarkable. Adrenals/Urinary Tract:  Negative adrenals. Mild left hydronephrosis. The left ureteral stone is no longer seen. New left internal ureteral stent with retention loop formed at the renal pelvis and tip terminating just above the left UVJ. There is also a right-sided ureteral stent with upper retention loop at the level of the upper ureter, lower straight catheter near the right UVJ. Gas in the right intrarenal collecting system presumably from recent intervention, no renal cortical swelling or perinephric edema. Punctate renal calculi scattered in the bilateral kidneys. Stomach/Bowel:  No obstruction. No appendicitis. Vascular/Lymphatic: No acute vascular abnormality. No mass or adenopathy. Reproductive:No pathologic findings. Other: No ascites or pneumoperitoneum. Musculoskeletal: No acute abnormalities. IMPRESSION: Mild left hydronephrosis although a ureteral stone is no longer seen. Bilateral ureteral stenting, the upper retention loop on the right is at the level of the upper ureter and both lower tips are at or above the UVJ. Punctate bilateral renal calculi, decreased stone burden. Electronically Signed   By:  Ronnette Coke M.D.   On: 01/07/2024 07:55   DG C-Arm 1-60 Min-No Report Result Date: 01/06/2024 Fluoroscopy was utilized by the requesting physician.  No radiographic interpretation.    Procedures Procedures    Medications Ordered in ED Medications  ketorolac  (TORADOL ) 15 MG/ML injection 15 mg (has no administration in time range)  HYDROmorphone  (DILAUDID ) injection 1 mg (1 mg Intravenous Given 01/07/24 0739)  metoCLOPramide  (REGLAN ) injection 10 mg (10 mg Intravenous Given 01/07/24 0737)  oxybutynin (DITROPAN) tablet 5 mg (5 mg Oral Given 01/07/24 9562)    ED Course/ Medical Decision Making/ A&P                                 Medical Decision Making Amount and/or Complexity of Data Reviewed External Data Reviewed: notes. Labs: ordered. Decision-making details documented in ED Course. Radiology: ordered and independent interpretation performed. Decision-making details documented in ED Course.  Risk Prescription drug management.   Pt with multiple medical problems and comorbidities and presenting today with a complaint that caries a high risk for morbidity and mortality.  Here today with uncontrollable pain after procedure yesterday.  Concern for procedure complication versus expected spasm related to stent placement versus obstructing stones versus new AKI.  Lower suspicion for pyelonephritis. Patient given pain control and medication for bladder spasm. Imaging pending to ensure appropriate placement of stent.  9:20 AM I independently interpreted patient's labs and CBC with minimal leukocytosis of 11, BMP within normal limits and UA is consistent with recent stent placement and being on Pyridium but no obvious signs of infection. I have independently visualized and interpreted pt's images today.  CT renal shows some mild hydronephrosis on the left and stent placement.  Radiology reports mild left hydronephrosis with stone no longer present and bilateral ureteral stents with  appropriate location and decreased stone burden.  On reevaluation patient is feeling improved after Dilaudid  and oxybutynin. 9:20 AM Patient is feeling much better and would like to go home.        Final Clinical Impression(s) / ED Diagnoses Final diagnoses:  Acute post-operative pain    Rx / DC Orders ED Discharge Orders          Ordered  oxybutynin (DITROPAN) 5 MG tablet  Every 8 hours PRN        01/07/24 0920              Almond Army, MD 01/07/24 [phone]

## 2024-01-07 NOTE — Discharge Instructions (Signed)
 The blood work and imaging all look okay.  You can take your next Ketarolac tab after 3pm today.  Also you can continue to take the oxybutynin which is the medication for the spasm every 8 hours and use your oxycodone  as needed.

## 2024-01-08 ENCOUNTER — Emergency Department (HOSPITAL_BASED_OUTPATIENT_CLINIC_OR_DEPARTMENT_OTHER)
Admission: EM | Admit: 2024-01-08 | Discharge: 2024-01-08 | Disposition: A | Discharge status: Home / Self Care | Attending: Emergency Medicine | Admitting: Emergency Medicine

## 2024-01-08 ENCOUNTER — Encounter (HOSPITAL_BASED_OUTPATIENT_CLINIC_OR_DEPARTMENT_OTHER): Payer: Self-pay

## 2024-01-08 DIAGNOSIS — N368 Other specified disorders of urethra: Secondary | ICD-10-CM | POA: Diagnosis not present

## 2024-01-08 DIAGNOSIS — N3289 Other specified disorders of bladder: Secondary | ICD-10-CM | POA: Insufficient documentation

## 2024-01-08 DIAGNOSIS — R1030 Lower abdominal pain, unspecified: Secondary | ICD-10-CM | POA: Diagnosis present

## 2024-01-08 DIAGNOSIS — G8918 Other acute postprocedural pain: Secondary | ICD-10-CM | POA: Diagnosis not present

## 2024-01-08 DIAGNOSIS — N3592 Unspecified urethral stricture, female: Secondary | ICD-10-CM

## 2024-01-08 LAB — CBC
HCT: 37 % (ref 36.0–46.0)
Hemoglobin: 12.4 g/dL (ref 12.0–15.0)
MCH: 32.2 pg (ref 26.0–34.0)
MCHC: 33.5 g/dL (ref 30.0–36.0)
MCV: 96.1 fL (ref 80.0–100.0)
Platelets: 204 10*3/uL (ref 150–400)
RBC: 3.85 MIL/uL — ABNORMAL LOW (ref 3.87–5.11)
RDW: 11.9 % (ref 11.5–15.5)
WBC: 10.3 10*3/uL (ref 4.0–10.5)
nRBC: 0 % (ref 0.0–0.2)

## 2024-01-08 LAB — COMPREHENSIVE METABOLIC PANEL WITH GFR
ALT: 31 U/L (ref 0–44)
AST: 23 U/L (ref 15–41)
Albumin: 4.4 g/dL (ref 3.5–5.0)
Alkaline Phosphatase: 59 U/L (ref 38–126)
Anion gap: 13 (ref 5–15)
BUN: 17 mg/dL (ref 6–20)
CO2: 23 mmol/L (ref 22–32)
Calcium: 9.6 mg/dL (ref 8.9–10.3)
Chloride: 102 mmol/L (ref 98–111)
Creatinine, Ser: 0.94 mg/dL (ref 0.44–1.00)
GFR, Estimated: >60 mL/min (ref >60)
Glucose, Bld: 84 mg/dL (ref 70–99)
Potassium: 3.7 mmol/L (ref 3.5–5.1)
Sodium: 138 mmol/L (ref 135–145)
Total Bilirubin: 0.4 mg/dL (ref 0.0–1.2)
Total Protein: 6.7 g/dL (ref 6.5–8.1)

## 2024-01-08 LAB — URINALYSIS, ROUTINE W REFLEX MICROSCOPIC

## 2024-01-08 LAB — URINALYSIS, MICROSCOPIC (REFLEX): RBC / HPF: >50 RBC/hpf (ref 0–5)

## 2024-01-08 LAB — LIPASE, BLOOD: Lipase: 18 U/L (ref 11–51)

## 2024-01-08 MED ORDER — HYDROMORPHONE HCL 1 MG/ML IJ SOLN
1.0000 mg | Freq: Once | INTRAMUSCULAR | Status: AC
  Administered 2024-01-08: 1 mg via INTRAVENOUS
  Filled 2024-01-08: qty 1

## 2024-01-08 MED ORDER — HYDROMORPHONE HCL 2 MG PO TABS: 2.0000 mg | ORAL_TABLET | Freq: Once | ORAL | Status: DC

## 2024-01-08 MED ORDER — KETOROLAC TROMETHAMINE 30 MG/ML IJ SOLN
15.0000 mg | Freq: Once | INTRAMUSCULAR | Status: AC
  Administered 2024-01-08: 15 mg via INTRAVENOUS
  Filled 2024-01-08: qty 1

## 2024-01-08 MED ORDER — OXYCODONE HCL 5 MG PO TABS
10.0000 mg | ORAL_TABLET | Freq: Once | ORAL | Status: AC
  Administered 2024-01-08: 10 mg via ORAL
  Filled 2024-01-08: qty 2

## 2024-01-08 MED ORDER — FENTANYL CITRATE PF 50 MCG/ML IJ SOSY
75.0000 ug | PREFILLED_SYRINGE | Freq: Once | INTRAMUSCULAR | Status: AC
  Administered 2024-01-08: 75 ug via INTRAVENOUS
  Filled 2024-01-08: qty 2

## 2024-01-08 NOTE — ED Triage Notes (Signed)
 Patients husband reports the patient has been in and out of the hospital recently for kidney stone pain. States she had kidney stones removed at [facility]. Today they called the on call Dr and he advised to remove the stents that were placed because they thought it was causing more pain. Husband repots her being very nauseous. Patient was given medication for pain and nausea. Pain medication has not helped. Medication for nausea on ly gave slight relief.  Patient states she was able to use the bathroom this morning, however she has pain coming from her urethra. States her urine is bloody. No urine odor.

## 2024-01-08 NOTE — ED Notes (Signed)
 MD at bedside.

## 2024-01-08 NOTE — ED Notes (Signed)
 AT 3am patient had percocet. At 6am patient had toradol .

## 2024-01-08 NOTE — ED Provider Notes (Signed)
 [place] EMERGENCY DEPARTMENT AT [facility] Provider Note   CSN: [id number] Arrival date & time: 01/08/24  4098     History  Chief Complaint  Patient presents with   Flank Pain    NGINA COTNOIR is a 33 y.o. female.  HPI    33 year old female comes in with chief complaint of flank pain. Patient has previous history of endometriosis.  She also is status post cystoscopy with bilateral ureteroscope, lithotripsy and bilateral ureteral stent placement from 4-25.  Patient indicates that she went home in the evening of the procedure.  She was fine until her pain became unbearable.  She had to return to the ER the next day, early in the morning because of pain.  She received medications in the ER and felt better, and was discharged.  However, her pain started returning again, last night.  Her pain is located " in the bladder region", lower part of abdomen and is described as cramping pain.  The pain is fairly constant now and at its peak it is extreme.  Patient has had persistent blood in the urine.  She is not passing any large clots.  She denies any flank pain.  Patient does have burning with urination, but that has been present since she left after the surgery.  Review of system is negative for any fevers, chills, sweats.  Yesterday patient had CT scan, she felt better and was discharged.  She did reach out to the urologist again and spoke with Dr. Willye Harvey.  She did remove her stents earlier this morning.  Initially she had significant pain relief, but within minutes she started noticing that the pain started rising again.  Patient has been taking oxybutynin, oxycodone , Keflex, muscle relaxants as prescribed.  She has increased her dose of pain medicine.  Given the persistent pain, she came to the ER. Home Medications Prior to Admission medications   Medication Sig Start Date End Date Taking? Authorizing Provider  phenazopyridine (PYRIDIUM) 200 MG tablet Take 1 tablet (200  mg total) by mouth 3 (three) times daily as needed for pain. 01/06/24   Stoneking, Ponce Brisker., MD  amphetamine -dextroamphetamine  (ADDERALL XR) 30 MG 24 hr capsule Take 1 capsule (30 mg total) by mouth daily. 11/08/23   Mozingo, Regina Nattalie, NP  cephALEXin (KEFLEX) 500 MG capsule Take 1 capsule (500 mg total) by mouth 2 (two) times daily. X 3 days to prevent infection with tethered stents. 01/06/24   Melody Spurling., MD  cholecalciferol (VITAMIN D3) 25 MCG (1000 UNIT) tablet Take 1,000 Units by mouth daily.    [provider]  Cyanocobalamin  (VITAMIN B-12) 2500 MCG SUBL Place 2,500 mcg under the tongue daily.    [provider]  ketorolac  (TORADOL ) 10 MG tablet Take 1 tablet (10 mg total) by mouth every 8 (eight) hours as needed for moderate pain (pain score 4-6) (or stent discomfort post-operatively). 01/06/24   Melody Spurling., MD  ondansetron  (ZOFRAN -ODT) 4 MG disintegrating tablet Take 1 tablet (4 mg total) by mouth every 8 (eight) hours as needed for nausea or vomiting. 01/02/24   Rolinda Climes, DO  oxybutynin (DITROPAN) 5 MG tablet Take 1 tablet (5 mg total) by mouth every 8 (eight) hours as needed for bladder spasms. 01/07/24   Almond Army, MD  oxyCODONE -acetaminophen  (PERCOCET/ROXICET) 5-325 MG tablet Take 1 tablet by mouth every 6 (six) hours as needed for severe pain (pain score 7-10) (post-operatively). 01/06/24   Melody Spurling., MD  rizatriptan  (  MAXALT -MLT) 10 MG disintegrating tablet Take 1 tablet (10 mg total) by mouth as needed for migraine. May repeat in 2 hours if needed 08/26/23   Luevenia Saha, MD  senna-docusate (SENOKOT-S) 8.6-50 MG tablet Take 1 tablet by mouth 2 (two) times daily. While taking strongest pain meds to prevent constipation. 01/06/24   Manny, Harvey Linen., MD      Allergies    Cat dander    Review of Systems   Review of Systems  All other systems reviewed and are negative.   Physical Exam Updated Vital Signs BP  122/86 (BP Location: Right Arm)   Pulse 73   Temp 97.7 F (36.5 C) (Oral)   Resp 14   LMP 09/02/2018 (Exact Date)   SpO2 96%  Physical Exam Vitals and nursing note reviewed.  Constitutional:      Appearance: She is well-developed.  HENT:     Head: Atraumatic.  Cardiovascular:     Rate and Rhythm: Normal rate.  Pulmonary:     Effort: Pulmonary effort is normal.  Abdominal:     Tenderness: There is abdominal tenderness.     Comments: Lower quadrant abdominal tenderness  Musculoskeletal:     Cervical back: Normal range of motion and neck supple.  Skin:    General: Skin is warm and dry.  Neurological:     Mental Status: She is alert and oriented to person, place, and time.     ED Results / Procedures / Treatments   Labs (all labs ordered are listed, but only abnormal results are displayed) Labs Reviewed  URINALYSIS, ROUTINE W REFLEX MICROSCOPIC - Abnormal; Notable for the following components:      Result Value   Color, Urine RED (*)    APPearance TURBID (*)    Glucose, UA   (*)    Value: TEST NOT REPORTED DUE TO COLOR INTERFERENCE OF URINE PIGMENT   Hgb urine dipstick   (*)    Value: TEST NOT REPORTED DUE TO COLOR INTERFERENCE OF URINE PIGMENT   Bilirubin Urine   (*)    Value: TEST NOT REPORTED DUE TO COLOR INTERFERENCE OF URINE PIGMENT   Ketones, ur   (*)    Value: TEST NOT REPORTED DUE TO COLOR INTERFERENCE OF URINE PIGMENT   Protein, ur   (*)    Value: TEST NOT REPORTED DUE TO COLOR INTERFERENCE OF URINE PIGMENT   Nitrite   (*)    Value: TEST NOT REPORTED DUE TO COLOR INTERFERENCE OF URINE PIGMENT   Leukocytes,Ua   (*)    Value: TEST NOT REPORTED DUE TO COLOR INTERFERENCE OF URINE PIGMENT   All other components within normal limits  CBC - Abnormal; Notable for the following components:   RBC 3.85 (*)    All other components within normal limits  URINALYSIS, MICROSCOPIC (REFLEX) - Abnormal; Notable for the following components:   Bacteria, UA RARE (*)    All  other components within normal limits  COMPREHENSIVE METABOLIC PANEL WITH GFR  LIPASE, BLOOD    EKG None  Radiology CT Renal Stone Study Result Date: 01/07/2024 CLINICAL DATA:  Abdominal/flank pain with stone suspected EXAM: CT ABDOMEN AND PELVIS WITHOUT CONTRAST TECHNIQUE: Multidetector CT imaging of the abdomen and pelvis was performed following the standard protocol without IV contrast. RADIATION DOSE REDUCTION: This exam was performed according to the departmental dose-optimization program which includes automated exposure control, adjustment of the mA and/or kV according to patient size and/or use of iterative reconstruction technique. COMPARISON:  Five days prior FINDINGS: Lower chest:  Abdominal/flank pain with stone suspected Hepatobiliary: No focal liver abnormality.No evidence of biliary obstruction or stone. Pancreas: Unremarkable. Spleen: Unremarkable. Adrenals/Urinary Tract:  Negative adrenals. Mild left hydronephrosis. The left ureteral stone is no longer seen. New left internal ureteral stent with retention loop formed at the renal pelvis and tip terminating just above the left UVJ. There is also a right-sided ureteral stent with upper retention loop at the level of the upper ureter, lower straight catheter near the right UVJ. Gas in the right intrarenal collecting system presumably from recent intervention, no renal cortical swelling or perinephric edema. Punctate renal calculi scattered in the bilateral kidneys. Stomach/Bowel:  No obstruction. No appendicitis. Vascular/Lymphatic: No acute vascular abnormality. No mass or adenopathy. Reproductive:No pathologic findings. Other: No ascites or pneumoperitoneum. Musculoskeletal: No acute abnormalities. IMPRESSION: Mild left hydronephrosis although a ureteral stone is no longer seen. Bilateral ureteral stenting, the upper retention loop on the right is at the level of the upper ureter and both lower tips are at or above the UVJ. Punctate  bilateral renal calculi, decreased stone burden. Electronically Signed   By: Ronnette Coke M.D.   On: 01/07/2024 07:55   DG C-Arm 1-60 Min-No Report Result Date: 01/06/2024 Fluoroscopy was utilized by the requesting physician.  No radiographic interpretation.    Procedures Procedures    Medications Ordered in ED Medications  HYDROmorphone  (DILAUDID ) tablet 2 mg (has no administration in time range)  ketorolac  (TORADOL ) 30 MG/ML injection 15 mg (has no administration in time range)  fentaNYL  (SUBLIMAZE ) injection 75 mcg (75 mcg Intravenous Given 01/08/24 0722)  ketorolac  (TORADOL ) 30 MG/ML injection 15 mg (15 mg Intravenous Given 01/08/24 0917)  oxyCODONE  (Oxy IR/ROXICODONE ) immediate release tablet 10 mg (10 mg Oral Given 01/08/24 6578)    ED Course/ Medical Decision Making/ A&P                                 Medical Decision Making Amount and/or Complexity of Data Reviewed Labs: ordered.  Risk Prescription drug management.   33 year old female comes with chief complaint of lower quadrant abdominal pain, cramping in nature that has been persistent since lithotripsy and stenting she had done on 4-25.  Patient has a history of endometriosis.  She had an ER visit yesterday where a CT renal stone was completed.  Differential diagnosis considered for this patient includes cystitis, perinephric hematoma, renal bleeding, ureteral colic because of residual stones, ureteral spasms/bladder spasm.  Patient nontoxic-appearing.  Vital signs are stable and within normal limits. Initial plan is to focus on symptom management. We will get basic labs and then reassess the patient.  Consultation: I consulted Dr. Willye Harvey and discussed patient's care.  I informed her that patient has already remove the Foley catheter.  I discussed with him that patient is already taking oxybutynin, Pyridium and in addition to that has pain medications, muscle relaxants, however her pain has been  persistent. Dr. Willye Harvey at this time still recommends continued symptom management.  This conversation/discussion has been discussed with the patient. She received IV fentanyl , which has helped.  At this time we will give her oral oxycodone  and IV Toradol .  Reassessment: Patient reassessed at 10:30 AM.  She continues to have bladder she tightness type pain.  She still does not have any flank pain.  Patient still denies any fevers, sweats.  I have reviewed patient's records.  Hemoglobin is at baseline.  White  count is normal.  Patient has normal electrolytes, renal function. UA has severe hematuria.  Microscopy is overall reassuring.  I have reviewed patient's records including recent discharge summary, CT scan from yesterday and also urine analysis from before.  Patient is already taking Keflex empirically.  I do not think any additional testing including ultrasound is indicated at this time. I suspect that the pain is because of spasms, which could be because of the instrumentation but also possibly because she still has stones.  Her pain again is manageable in the ER.  I feel it might be best to continue conservative management for now.  I have discussed return precautions with the patient. If she returns, we likely should consider admission for pain control.  Final Clinical Impression(s) / ED Diagnoses Final diagnoses:  Urethral spasm in female  Bladder spasm  Acute post-operative pain    Rx / DC Orders ED Discharge Orders     None         Deatra Face, MD 01/08/24 1215

## 2024-01-08 NOTE — Discharge Instructions (Signed)
 We saw you in the emergency room for pain.  The postoperative pain is likely because of spasms.  CT scan from yesterday was reassuring.  Labs today are reassuring.  Dr. Willye Harvey also recommends continued conservative management and focusing on pain control.  Return to the emergency room if you start having worsening pain that is unbearable.  Return to the ER if you start having severe nausea, vomiting, fevers, chills.  Wait for about 4 hours prior to next round of pain medicine.

## 2024-01-08 NOTE — ED Notes (Addendum)
 Pt ambulated to bathroom without assistance or issue.  Reports continued appearance of blood and blood clots in urine.

## 2024-01-11 NOTE — Progress Notes (Signed)
 Initial neurology clinic note  Norma [name] MRN: [id number] DOB: 08-21-1991  Referring provider: Luevenia Saha, MD  Primary care provider: Luevenia Saha, MD  Reason for consult:  headaches  Subjective:  This is Norma [name], a 33 y.o. right-handed female with a medical history of nephrolithiasis, vit D deficiency, B12 deficiency, ADHD who presents to neurology clinic with headaches. The patient is accompanied by husband.  Patient's headaches started in 2019 after a difficult delivery of her 3rd child. The headaches will typically start in the neck, radiates into the back of her head and into her forehead. It is a tight, throbbing pain. She does not think it is a muscle tightness though. If she catches it early, and takes excedrin it can reduce the pain. The pain can be 9/10. If she takes something, it may go down to 3/10. The headaches can last days. If her headache gets back, she can feel dizziness, like she is rocking and her eyes can't focus. She does not feel well on her feet. She has photophobia, phonophobia, nausea, and vomiting. She has at least 2 episodes of multiple day headaches per month (8-10 headache days per month). On the other 20 days, she may have a dull headache sensation too, but she is very used to this.  Eye strain seems to be a trigger. Putting her chin to her chest also seems to make this worse. When she gets a headache she wants to lay in cold dark room and sleep it off with a cold rag when the pain is < 5/10. When it is worse, she is more restless or crying in pain.  She was initially given rizatriptan  but this added to dizziness and did not fully help. She is now taking sumatriptan , but this can contribute to dizziness. She does not take the sumatriptan  at headache onset due to this though. She has never been on a preventative medication.  She is not sure if bearing down will trigger a headache, but it would make headache worse. She also mentions grinding  her teeth at night that makes her headaches worse. She used to have a mouth guard but does not currently.  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  Smoker: No OCP use: No Caffiene use: 1 cup of coffee or soda or energy drink per day EtOH use: 1-2 drinks per week Restrictive diet: No Family history of neurologic disease, including headaches: no   MEDICATIONS:  Outpatient Encounter Medications as of 01/25/2024  Medication Sig   amphetamine -dextroamphetamine  (ADDERALL XR) 30 MG 24 hr capsule Take 1 capsule (30 mg total) by mouth daily.   cholecalciferol (VITAMIN D3) 25 MCG (1000 UNIT) tablet Take 1,000 Units by mouth daily.   Cyanocobalamin  (VITAMIN B-12) 2500 MCG SUBL Place 2,500 mcg under the tongue daily.   ondansetron  (ZOFRAN -ODT) 4 MG disintegrating tablet Take 1 tablet (4 mg total) by mouth every 8 (eight) hours as needed for nausea or vomiting.   SUMAtriptan  (IMITREX ) 50 MG tablet Take 50 mg by mouth as needed for migraine. May repeat in 2 hours if headache persists or recurs.   senna-docusate (SENOKOT-S) 8.6-50 MG tablet Take 1 tablet by mouth 2 (two) times daily. While taking strongest pain meds to prevent constipation. (Patient not taking: Reported on 01/25/2024)   [DISCONTINUED] cephALEXin  (KEFLEX ) 500 MG capsule Take 1 capsule (500 mg total) by mouth 2 (two) times daily. X 3 days to prevent infection  with tethered stents.   [DISCONTINUED] ketorolac  (TORADOL ) 10 MG tablet Take 1 tablet (10 mg total) by mouth every 8 (eight) hours as needed for moderate pain (pain score 4-6) (or stent discomfort post-operatively).   [DISCONTINUED] oxybutynin  (DITROPAN ) 5 MG tablet Take 1 tablet (5 mg total) by mouth every 8 (eight) hours as needed for bladder spasms.   [DISCONTINUED] oxyCODONE -acetaminophen  (PERCOCET/ROXICET) 5-325 MG tablet Take 1 tablet by mouth every 6 (six) hours as needed for severe pain (pain score 7-10)  (post-operatively).   [DISCONTINUED] phenazopyridine  (PYRIDIUM ) 200 MG tablet Take 1 tablet (200 mg total) by mouth 3 (three) times daily as needed for pain.   [DISCONTINUED] rizatriptan  (MAXALT -MLT) 10 MG disintegrating tablet Take 1 tablet (10 mg total) by mouth as needed for migraine. May repeat in 2 hours if needed   No facility-administered encounter medications on file as of 01/25/2024.    PAST MEDICAL HISTORY: Past Medical History:  Diagnosis Date   Anemia    Anxiety    Asthma    exercise and allergy induced   Chronic allergic rhinitis 05/07/2019   Dysmenorrhea    Endometriosis 08/26/2023   Pt reported; found during partial hysterectomy    History of kidney stones    Migraine headache    Right ureteral stone     PAST SURGICAL HISTORY: Past Surgical History:  Procedure Laterality Date   ADENOIDECTOMY     ANTERIOR AND POSTERIOR REPAIR WITH SACROSPINOUS FIXATION N/A 09/25/2018   Procedure: ANTERIOR AND POSTERIOR REPAIR, MC CALLS;  Surgeon: Concepcion Deck, MD;  Location: [facility];  Service: Gynecology;  Laterality: N/A;   CYSTOSCOPY N/A 09/25/2018   Procedure: CYSTOSCOPY;  Surgeon: Concepcion Deck, MD;  Location: [facility];  Service: Gynecology;  Laterality: N/A;   CYSTOSCOPY W/ RETROGRADES Bilateral 01/06/2024   Procedure: CYSTOSCOPY, WITH RETROGRADE PYELOGRAM;  Surgeon: Melody Spurling., MD;  Location: [facility];  Service: Urology;  Laterality: Bilateral;   CYSTOSCOPY WITH RETROGRADE PYELOGRAM, URETEROSCOPY AND STENT PLACEMENT Left 05/29/2020   Procedure: CYSTOSCOPY WITH LEFT  RETROGRADE URETEROSCOPY AND STENT PLACEMENT;  Surgeon: Homero Luster, MD;  Location: [facility];  Service: Urology;  Laterality: Left;   CYSTOSCOPY/URETEROSCOPY/HOLMIUM LASER/STENT PLACEMENT Bilateral 01/06/2024   Procedure: CYSTOSCOPY/URETEROSCOPY/HOLMIUM LASER/STENT PLACEMENT;  Surgeon: Melody Spurling., MD;  Location: [facility];  Service: Urology;  Laterality: Bilateral;   DILATION AND CURETTAGE  OF UTERUS N/A 03/30/2018   Procedure: DILATATION AND EVACUATION;  Surgeon: Woodrow Hazy, MD;  Location: [facility];  Service: Gynecology;  Laterality: N/A;   TONSILLECTOMY     VAGINAL HYSTERECTOMY Bilateral 09/25/2018   Procedure: VAGINAL HYSTERECTOMY, BILATERAL FIMBRECTOMY;  Surgeon: Concepcion Deck, MD;  Location: [facility];  Service: Gynecology;  Laterality: Bilateral;   VAGINAL HYSTERECTOMY     WISDOM TOOTH EXTRACTION      ALLERGIES: Allergies  Allergen Reactions   Cat Dander Hives and Rash    FAMILY HISTORY: Family History  Problem Relation Age of Onset   Diabetes Mother    Hypertension Mother    Healthy Daughter     SOCIAL HISTORY: Social History   Tobacco Use   Smoking status: Never   Smokeless tobacco: Never  Vaping Use   Vaping status: Never Used  Substance Use Topics   Alcohol use: Yes    Comment: occasional   Drug use: No   Social History   Social History Narrative   Are you right handed or left handed? Right   Are you currently employed ?    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   Who lives with you? children   What type of home do you live in: 1 story or 2 story? two    Caffeine 2 cups weekly    Objective:  Vital Signs:  BP 106/72   Pulse 78   Ht 5\' 7"  (1.702 m)   Wt 165 lb (74.8 kg)   LMP 09/02/2018 (Exact Date)   SpO2 99%   BMI 25.84 kg/m   General: No acute distress.  Patient appears well-groomed.   Head:  Normocephalic/atraumatic Eyes:  fundi examined, disc margins clear, no obvious papilledema Neck: supple, paraspinal/trapezius tenderness/tightness Heart: regular rate and rhythm Lungs: Clear to auscultation bilaterally. Vascular: No carotid bruits.  Neurological Exam: Mental status: alert and oriented, speech fluent and not dysarthric, language intact.  Cranial nerves: CN I: not tested CN II: pupils equal, round and reactive to light, visual fields intact CN III, IV, VI:  full  range of motion, no nystagmus, no ptosis CN V: facial sensation intact. CN VII: upper and lower face symmetric CN VIII: hearing intact CN IX, X: uvula midline CN XI: sternocleidomastoid and trapezius muscles intact CN XII: tongue midline  Bulk & Tone: normal. Motor:  muscle strength 5/5 throughout Deep Tendon Reflexes:  2+ throughout,  toes downgoing.   Sensation:  Pinprick sensation intact. Finger to nose testing:  Without dysmetria.  Gait:  Normal station and stride.  Romberg negative.   Labs and Imaging review: Internal labs: 01/08/24: CMP unremarkable CBC unremarkable (MCV 96.1)  12/05/23: Vit D 18.25 B12: 449  08/26/23: B12: 209 Folate wnl TSH wnl  Imaging/Procedures: MRI brain w/wo contrast (07/04/19): FINDINGS: Brain: Cerebral volume within normal limits for patient age. No focal parenchymal signal abnormality identified.   No abnormal foci of restricted diffusion to suggest acute or subacute ischemia. Gray-white matter differentiation well maintained. No encephalomalacia to suggest chronic infarction. No foci of susceptibility artifact to suggest acute or chronic intracranial hemorrhage.   No mass lesion, midline shift or mass effect. No hydrocephalus. No extra-axial fluid collection. Major dural sinuses are grossly patent.   Pituitary gland and suprasellar region are normal. Midline structures intact and normal.   No abnormal enhancement.   Vascular: Major intracranial vascular flow voids well maintained and normal in appearance.   Skull and upper cervical spine: Cerebellar tonsillar ectopia of up to 5 mm without frank Chiari malformation. Visualized upper cervical spine within normal limits. Bone marrow signal intensity normal. 16 mm T2 hypointense soft tissue nodule at the right parietal scalp noted, nonspecific, but of doubtful clinical significance.   Sinuses/Orbits: Globes and orbital soft tissues within normal limits.   Paranasal sinuses  are clear. No mastoid effusion. Inner ear structures normal.   Other: None.   IMPRESSION: 1. No acute intracranial abnormality. 2. 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 3. Otherwise unremarkable and normal brain MRI.   Assessment/Plan:  CASSANDRE KOLARIK is a 33 y.o. female who presents for evaluation of headaches. She has a relevant medical history of nephrolithiasis, vit D deficiency, B12 deficiency, ADHD. Her neurological examination is normal today. 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 Patient's headaches sound most consistent with migraine without aura with contributions from cervicalgia. The mild cerebellar tonsillar ectopia does not appear to be symptomatic, and may be an incidental finding, but I will monitor symptoms closely to ensure that  symptoms are not related. I will begin treating as migraine and monitor response. Patient has tried and failed rizatriptan  and sumatriptan  for migraine rescue (not effective and increased dizziness). I will start nortriptyline for prevention and get Florette Hurry approved for rescue.  PLAN: -Continue vit D and B12 supplementation -PT for cervicalgia -For migraines: Migraine prevention:  Nortriptyline 10 mg at bedtime for 1 week, then increase to 20 mg thereafter Migraine rescue:  Ubrelvy 100 mg as needed, samples given today. Zofran  as needed for nausea. Limit use of pain relievers to no more than 2 days out of week to prevent risk of rebound or medication-overuse headache. Keep headache diary  -Return to clinic 3 months  The impression above as well as the plan as outlined below were extensively discussed with the patient (in the company of husband) who voiced understanding. All questions were answered to their satisfaction.  When available, results of the above investigations and possible further recommendations will be  communicated to the patient via telephone/MyChart. Patient to call office if not contacted after expected testing turnaround time.   Total time spent reviewing records, interview, history/exam, documentation, and coordination of care on day of encounter:  60 min   Thank you for allowing me to participate in patient's care.  If I can answer any additional questions, I would be pleased to do so.  Rommie Coats, MD   CC: Luevenia Saha, MD [address] [place] [address]  CC: Referring provider: Luevenia Saha, MD [address] [place],  [place] [address]

## 2024-01-25 ENCOUNTER — Telehealth: Payer: Self-pay

## 2024-01-25 ENCOUNTER — Encounter: Payer: Medicaid Other | Admitting: Family Medicine

## 2024-01-25 ENCOUNTER — Encounter: Payer: Self-pay | Admitting: Neurology

## 2024-01-25 ENCOUNTER — Ambulatory Visit: Admitting: Neurology

## 2024-01-25 VITALS — BP 106/72 | HR 78 | Ht 67.0 in | Wt 165.0 lb

## 2024-01-25 DIAGNOSIS — M542 Cervicalgia: Secondary | ICD-10-CM

## 2024-01-25 DIAGNOSIS — H53149 Visual discomfort, unspecified: Secondary | ICD-10-CM | POA: Diagnosis not present

## 2024-01-25 DIAGNOSIS — F40298 Other specified phobia: Secondary | ICD-10-CM

## 2024-01-25 DIAGNOSIS — G43009 Migraine without aura, not intractable, without status migrainosus: Secondary | ICD-10-CM | POA: Diagnosis not present

## 2024-01-25 DIAGNOSIS — R112 Nausea with vomiting, unspecified: Secondary | ICD-10-CM

## 2024-01-25 MED ORDER — NORTRIPTYLINE HCL 10 MG PO CAPS: 20.0000 mg | ORAL_CAPSULE | Freq: Every day | ORAL | 3 refills | Status: DC

## 2024-01-25 MED ORDER — UBRELVY 100 MG PO TABS: 100.0000 mg | ORAL_TABLET | ORAL | 5 refills | Status: AC | PRN

## 2024-01-25 NOTE — Patient Instructions (Addendum)
 I saw you today for headaches. Your headaches are likely migraines with contributions from your neck.  I am going to refer you to physical therapy to help with your neck pain.  For your migraines: -For migraines: Migraine prevention:  Nortriptyline 10 mg at bedtime for 1 week, then increase to 20 mg (2 tablets) thereafter Migraine rescue:  Ubrelvy 100 mg as needed at headache onset, samples given today. I will need to get this medication approved. Zofran  as needed for nausea. Limit use of pain relievers to no more than 2 days out of week to prevent risk of rebound or medication-overuse headache. Keep headache diary  -Continue B12 and vitamin D  supplementation  -Return to clinic 3 months  Please let me know if you have any questions or concerns in the meantime.  The physicians and staff at [facility] are committed to providing excellent care. You may receive a survey requesting feedback about your experience at our office. We strive to receive "very good" responses to the survey questions. If you feel that your experience would prevent you from giving the office a "very good " response, please contact our office to try to remedy the situation. We may be reached at [phone]. Thank you for taking the time out of your busy day to complete the survey.   Rommie Coats, MD [facility]   More migraine information: Be aware of common food triggers:  - Caffeine:  coffee, black tea, cola, Mt. Dew  - Chocolate  - Dairy:  aged cheeses (brie, blue, cheddar, gouda, Parmasan, provolone, romano, Swiss, etc), chocolate milk, buttermilk, sour cream, limit eggs and yogurt  - Nuts, peanut butter  - Alcohol  - Cereals/grains:  FRESH breads (fresh bagels, sourdough, doughnuts), yeast productions  - Processed/canned/aged/cured meats (pre-packaged deli meats, hotdogs)  - MSG/glutamate:  soy sauce, flavor enhancer, pickled/preserved/marinated foods  - Sweeteners:  aspartame (Equal, Nutrasweet).   Sugar and Splenda are okay  - Vegetables:  legumes (lima beans, lentils, snow peas, fava beans, pinto peans, peas, garbanzo beans), sauerkraut, onions, olives, pickles  - Fruit:  avocados, bananas, citrus fruit (orange, lemon, grapefruit), mango  - Other:  Frozen meals, macaroni and cheese Routine exercise Stay adequately hydrated (aim for 64 oz water  daily) Keep headache diary Maintain proper stress management Maintain proper sleep hygiene Do not skip meals Consider supplements:  magnesium citrate 400mg  daily, riboflavin 400mg  daily, coenzyme Q10 100mg  three times daily.

## 2024-01-27 ENCOUNTER — Other Ambulatory Visit (HOSPITAL_COMMUNITY): Payer: Self-pay

## 2024-01-29 ENCOUNTER — Other Ambulatory Visit: Payer: Self-pay | Admitting: Urology

## 2024-01-30 ENCOUNTER — Telehealth: Payer: Self-pay | Admitting: Pharmacy Technician

## 2024-01-30 NOTE — Telephone Encounter (Signed)
 PA has been submitted, and telephone encounter has been created. Please see telephone encounter dated 5.19.25.   Please remember to send all request to the Rx Prior Auth Team to reduce delays in request.

## 2024-01-30 NOTE — Telephone Encounter (Signed)
 Pharmacy Patient Advocate Encounter   Received notification from Pt Calls Messages that prior authorization for UBRELVY  100MG  is required/requested.   Insurance verification completed.   The patient is insured through [facility] MEDICAID .   Per test claim: PA required; PA submitted to above mentioned insurance via CoverMyMeds Key/confirmation #/EOC [id number] Status is pending

## 2024-01-31 ENCOUNTER — Telehealth: Payer: Self-pay | Admitting: Neurology

## 2024-01-31 NOTE — Telephone Encounter (Signed)
 Myrtie Atkinson called and states that the patient is [facility]. He said thank you for the referral

## 2024-02-14 NOTE — Telephone Encounter (Signed)
 Approved on May 19 by [employer] 2017 NCPDP Request Reference Number: [id number]. UBRELVY  TAB 100MG  is approved through 01/29/2025. For further questions, call [employer] at [phone]. Effective Date: 01/30/2024 Authorization Expiration Date: 01/29/2025

## 2024-02-22 NOTE — Telephone Encounter (Signed)
 Called and left detailed message on voice mail about medicine being approved.

## 2024-03-09 ENCOUNTER — Telehealth (INDEPENDENT_AMBULATORY_CARE_PROVIDER_SITE_OTHER): Payer: Medicaid Other | Admitting: Adult Health

## 2024-03-09 ENCOUNTER — Encounter: Payer: Self-pay | Admitting: Adult Health

## 2024-03-09 DIAGNOSIS — F909 Attention-deficit hyperactivity disorder, unspecified type: Secondary | ICD-10-CM | POA: Diagnosis not present

## 2024-03-09 DIAGNOSIS — F411 Generalized anxiety disorder: Secondary | ICD-10-CM | POA: Diagnosis not present

## 2024-03-09 MED ORDER — AMPHETAMINE-DEXTROAMPHETAMINE 20 MG PO TABS: 20.0000 mg | ORAL_TABLET | Freq: Every day | ORAL | 0 refills | Status: DC

## 2024-03-09 MED ORDER — AMPHETAMINE-DEXTROAMPHET ER 30 MG PO CP24: 30.0000 mg | ORAL_CAPSULE | Freq: Every day | ORAL | 0 refills | Status: DC

## 2024-03-09 MED ORDER — FLUOXETINE HCL 20 MG PO CAPS: 20.0000 mg | ORAL_CAPSULE | Freq: Every day | ORAL | 5 refills | Status: DC

## 2024-03-09 NOTE — Progress Notes (Signed)
 Norma [name] [id number] 08/19/1991 33 y.o.  Virtual Visit via Video Note  I connected with pt @ on 03/09/24 at  9:00 AM EDT by a video enabled telemedicine application and verified that I am speaking with the correct person using two identifiers.   I discussed the limitations of evaluation and management by telemedicine and the availability of in person appointments. The patient expressed understanding and agreed to proceed.  I discussed the assessment and treatment plan with the patient. The patient was provided an opportunity to ask questions and all were answered. The patient agreed with the plan and demonstrated an understanding of the instructions.   The patient was advised to call back or seek an in-person evaluation if the symptoms worsen or if the condition fails to improve as anticipated.  I provided 25 minutes of non-face-to-face time during this encounter.  The patient was located at home.  The provider was located at [name].   Norma Norma Sayers, NP   Subjective:   Patient ID:  Norma [name] is a 33 y.o. (DOB 1991/05/04) female.  Chief Complaint: No chief complaint on file.   HPI Norma [name] presents for follow-up of GAD and ADHD.  Describes mood today as ok. Pleasant. Mood symptoms - denies depression, anxiety and irritability. Reports stable interest and motivation.Denies panic attacks. Denies worry, rumination and over thinking. Reports mood is stable. Stating I feel like I'm doing ok. Feels like medications work well. Taking medications as prescribed.  Energy levels stable. Active, has a regular exercise routine.  Enjoys some usual interests and activities. Married. Lives with husband and 3 children. Husband's family local. She has family in [place]. Spending time with family.  Appetite adequate. Weight gain 170 pounds. Sleeps well most nights. Averages 7 to 8 hours.   Focus and concentration stable. Completing tasks. Managing aspects of  household. Works full time as a Paramedic.  Denies SI or HI.  Denies AH or VH. Denies self harm. Denies substance use.  Review of Systems:  Review of Systems  Musculoskeletal:  Negative for gait problem.  Neurological:  Negative for tremors.  Psychiatric/Behavioral:         Please refer to HPI    Medications: I have reviewed the patient's current medications.  Current Outpatient Medications  Medication Sig Dispense Refill   amphetamine -dextroamphetamine  (ADDERALL XR) 30 MG 24 hr capsule Take 1 capsule (30 mg total) by mouth daily. 30 capsule 0   cholecalciferol (VITAMIN D3) 25 MCG (1000 UNIT) tablet Take 1,000 Units by mouth daily.     Cyanocobalamin  (VITAMIN B-12) 2500 MCG SUBL Place 2,500 mcg under the tongue daily.     nortriptyline  (PAMELOR ) 10 MG capsule Take 2 capsules (20 mg total) by mouth at bedtime. Take 1 capsule (10 mg) at bedtime for 1 week, then increase to 2 capsules (20 mg) at bedtime thereafter 60 capsule 3   ondansetron  (ZOFRAN -ODT) 4 MG disintegrating tablet Take 1 tablet (4 mg total) by mouth every 8 (eight) hours as needed for nausea or vomiting. 20 tablet 0   senna-docusate (SENOKOT-S) 8.6-50 MG tablet Take 1 tablet by mouth 2 (two) times daily. While taking strongest pain meds to prevent constipation. (Patient not taking: Reported on 01/25/2024) 10 tablet 0   Ubrogepant  (UBRELVY ) 100 MG TABS Take 1 tablet (100 mg total) by mouth as needed. 30 tablet 5   No current facility-administered medications for this visit.    Medication Side Effects: None  Allergies:  Allergies  Allergen Reactions  Cat Dander Hives and Rash    Past Medical History:  Diagnosis Date   Anemia    Anxiety    Asthma    exercise and allergy induced   Chronic allergic rhinitis 05/07/2019   Dysmenorrhea    Endometriosis 08/26/2023   Pt reported; found during partial hysterectomy    History of kidney stones    Migraine headache    Right ureteral stone     Family History   Problem Relation Age of Onset   Diabetes Mother    Hypertension Mother    Healthy Daughter     Social History   Socioeconomic History   Marital status: Married    Spouse name: Not on file   Number of children: 3   Years of education: Not on file   Highest education level: Not on file  Occupational History   Occupation: stay at home mom  Tobacco Use   Smoking status: Never   Smokeless tobacco: Never  Vaping Use   Vaping status: Never Used  Substance and Sexual Activity   Alcohol use: Yes    Comment: occasional   Drug use: No   Sexual activity: Yes    Birth control/protection: None, Surgical  Other Topics Concern   Not on file  Social History Narrative   Are you right handed or left handed? Right   Are you currently employed ?    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   Who lives with you? children   What type of home do you live in: 1 story or 2 story? two    Caffeine 2 cups weekly   Social Drivers of Corporate investment banker Strain: Not on file  Food Insecurity: Not on file  Transportation Needs: Not on file  Physical Activity: Not on file  Stress: Not on file  Social Connections: Not on file  Intimate Partner Violence: Not on file    Past Medical History, Surgical history, Social history, and Family history were reviewed and updated as appropriate.   Please see review of systems for further details on the patient's review from today.   Objective:   Physical Exam:  LMP 09/02/2018 (Exact Date)   Physical Exam Constitutional:      General: She is not in acute distress.  Musculoskeletal:        General: No deformity.   Neurological:     Mental Status: She is alert and oriented to person, place, and time.     Coordination: Coordination normal.   Psychiatric:        Attention and Perception: Attention and perception normal. She does not perceive auditory or visual hallucinations.        Mood and  Affect: Mood normal. Mood is not anxious or depressed. Affect is not labile, blunt, angry or inappropriate.        Speech: Speech normal.        Behavior: Behavior normal.        Thought Content: Thought content normal. Thought content is not paranoid or delusional. Thought content does not include homicidal or suicidal ideation. Thought content does not include homicidal or suicidal plan.        Cognition and Memory: Cognition and memory normal.        Judgment: Judgment normal.     Comments: Insight intact     Lab Review:     Component Value Date/Time   NA 138 01/08/2024 0646  K 3.7 01/08/2024 0646   CL 102 01/08/2024 0646   CO2 23 01/08/2024 0646   GLUCOSE 84 01/08/2024 0646   BUN 17 01/08/2024 0646   CREATININE 0.94 01/08/2024 0646   CALCIUM 9.6 01/08/2024 0646   PROT 6.7 01/08/2024 0646   ALBUMIN 4.4 01/08/2024 0646   AST 23 01/08/2024 0646   ALT 31 01/08/2024 0646   ALKPHOS 59 01/08/2024 0646   BILITOT 0.4 01/08/2024 0646   GFRNONAA >60 01/08/2024 0646   GFRAA >60 05/26/2020 0338       Component Value Date/Time   WBC 10.3 01/08/2024 0646   RBC 3.85 (L) 01/08/2024 0646   HGB 12.4 01/08/2024 0646   HCT 37.0 01/08/2024 0646   PLT 204 01/08/2024 0646   MCV 96.1 01/08/2024 0646   MCH 32.2 01/08/2024 0646   MCHC 33.5 01/08/2024 0646   RDW 11.9 01/08/2024 0646   LYMPHSABS 1.5 01/07/2024 0659   MONOABS 0.7 01/07/2024 0659   EOSABS 0.0 01/07/2024 0659   BASOSABS 0.0 01/07/2024 0659    No results found for: POCLITH, LITHIUM   No results found for: PHENYTOIN, PHENOBARB, VALPROATE, CBMZ   .res Assessment: Plan:    Plan:  PDMP reviewed  Adderall XR 30 daily  Adderall 20mg  daily  Restart Prozac  20mg  daily  Monitor BP between visits while taking stimulant medication.   RTC 6 months - will call in 3 months for next set of refills.   25 minutes spent dedicated to the care of this patient on the date of this encounter to include pre-visit review of  records, ordering of medication, post visit documentation, and face-to-face time with the patient discussing GAD and ADHD. Discussed continuing current medication regimen.  Patient advised to contact office with any questions, adverse effects, or acute worsening in signs and symptoms.  Discussed potential benefits, risks, and side effects of stimulants with patient to include increased heart rate, palpitations, insomnia, increased anxiety, inceased irritability, or decreased appetite.  Instructed patient to contact office if experiencing any significant tolerability issues.  There are no diagnoses linked to this encounter.   Please see After Visit Summary for patient specific instructions.  Future Appointments  Date Time Provider Department [name]  03/09/2024  9:00 AM Harce Volden, Norma Mattocks, NP [facility] None  05/03/2024  2:30 PM Leigh Venetia CROME, MD [facility] None  07/10/2024  1:30 PM Alm Delon SAILOR, DO [facility] None    No orders of the defined types were placed in this encounter.     -------------------------------

## 2024-04-16 ENCOUNTER — Other Ambulatory Visit: Payer: Self-pay | Admitting: Adult Health

## 2024-04-16 DIAGNOSIS — F411 Generalized anxiety disorder: Secondary | ICD-10-CM

## 2024-04-25 NOTE — Progress Notes (Deleted)
 NEUROLOGY FOLLOW UP OFFICE NOTE  Norma [name] [id number]  Subjective:  Norma [name] is a 33 y.o. year old right-handed female with a medical history of nephrolithiasis, vit D deficiency, B12 deficiency, ADHD who we last saw on 01/25/24 for headaches.  To briefly review: 01/25/24: Patient's headaches started in 2019 after a difficult delivery of her 3rd child. The headaches will typically start in the neck, radiates into the back of her head and into her forehead. It is a tight, throbbing pain. She does not think it is a muscle tightness though. If she catches it early, and takes excedrin it can reduce the pain. The pain can be 9/10. If she takes something, it may go down to 3/10. The headaches can last days. If her headache gets back, she can feel dizziness, like she is rocking and her eyes can't focus. She does not feel well on her feet. She has photophobia, phonophobia, nausea, and vomiting. She has at least 2 episodes of multiple day headaches per month (8-10 headache days per month). On the other 20 days, she may have a dull headache sensation too, but she is very used to this.   Eye strain seems to be a trigger. Putting her chin to her chest also seems to make this worse. When she gets a headache she wants to lay in cold dark room and sleep it off with a cold rag when the pain is < 5/10. When it is worse, she is more restless or crying in pain.   She was initially given rizatriptan  but this added to dizziness and did not fully help. She is now taking sumatriptan , but this can contribute to dizziness. She does not take the sumatriptan  at headache onset due to this though. She has never been on a preventative medication.   She is not sure if bearing down will trigger a headache, but it would make headache worse. She also mentions grinding her teeth at night that makes her headaches worse. She used to have a mouth guard but does not currently.   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   Smoker: No OCP use: No Caffiene use: 1 cup of coffee or soda or energy drink per day EtOH use: 1-2 drinks per week Restrictive diet: No Family history of neurologic disease, including headaches: no  Most recent Assessment and Plan (01/25/24): KALILAH BARUA is a 33 y.o. female who presents for evaluation of headaches. She has a relevant medical history of nephrolithiasis, vit D deficiency, B12 deficiency, ADHD. Her neurological examination is normal today. 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 Patient's headaches sound most consistent with migraine without aura with contributions from cervicalgia. The mild cerebellar tonsillar ectopia does not appear to be symptomatic, and may be an incidental finding, but I will monitor symptoms closely to ensure that symptoms are not related. I will begin treating as migraine and monitor response. Patient has tried and failed rizatriptan  and sumatriptan  for migraine rescue (not effective and increased dizziness). I will start nortriptyline  for prevention and get Ubrelvy  approved for rescue.   PLAN: -Continue vit D and B12 supplementation -PT for cervicalgia -For migraines: Migraine prevention:  Nortriptyline  10 mg at bedtime for 1 week, then increase to 20 mg thereafter Migraine rescue:  Ubrelvy  100 mg as needed, samples given today. Zofran  as needed for nausea. Limit use of  pain relievers to no more than 2 days out of week to prevent risk of rebound or medication-overuse headache. Keep headache diary  Since their last visit: Ubrelvy  was approved on 02/14/24 through 01/29/2025. ***  Headaches?*** PT?***  MEDICATIONS:  Outpatient Encounter Medications as of 05/03/2024  Medication Sig   amphetamine -dextroamphetamine  (ADDERALL XR) 30 MG 24 hr capsule Take 1 capsule (30 mg total) by mouth daily.    amphetamine -dextroamphetamine  (ADDERALL XR) 30 MG 24 hr capsule Take 1 capsule (30 mg total) by mouth daily.   [START ON 05/04/2024] amphetamine -dextroamphetamine  (ADDERALL XR) 30 MG 24 hr capsule Take 1 capsule (30 mg total) by mouth daily.   amphetamine -dextroamphetamine  (ADDERALL) 20 MG tablet Take 1 tablet (20 mg total) by mouth daily.   amphetamine -dextroamphetamine  (ADDERALL) 20 MG tablet Take 1 tablet (20 mg total) by mouth daily.   [START ON 05/04/2024] amphetamine -dextroamphetamine  (ADDERALL) 20 MG tablet Take 1 tablet (20 mg total) by mouth daily.   cholecalciferol (VITAMIN D3) 25 MCG (1000 UNIT) tablet Take 1,000 Units by mouth daily.   Cyanocobalamin  (VITAMIN B-12) 2500 MCG SUBL Place 2,500 mcg under the tongue daily.   FLUoxetine  (PROZAC ) 20 MG capsule TAKE 1 CAPSULE BY MOUTH EVERY DAY   nortriptyline  (PAMELOR ) 10 MG capsule Take 2 capsules (20 mg total) by mouth at bedtime. Take 1 capsule (10 mg) at bedtime for 1 week, then increase to 2 capsules (20 mg) at bedtime thereafter   ondansetron  (ZOFRAN -ODT) 4 MG disintegrating tablet Take 1 tablet (4 mg total) by mouth every 8 (eight) hours as needed for nausea or vomiting.   senna-docusate (SENOKOT-S) 8.6-50 MG tablet Take 1 tablet by mouth 2 (two) times daily. While taking strongest pain meds to prevent constipation. (Patient not taking: Reported on 01/25/2024)   Ubrogepant  (UBRELVY ) 100 MG TABS Take 1 tablet (100 mg total) by mouth as needed.   No facility-administered encounter medications on file as of 05/03/2024.    PAST MEDICAL HISTORY: Past Medical History:  Diagnosis Date   Anemia    Anxiety    Asthma    exercise and allergy induced   Chronic allergic rhinitis 05/07/2019   Dysmenorrhea    Endometriosis 08/26/2023   Pt reported; found during partial hysterectomy    History of kidney stones    Migraine headache    Right ureteral stone     PAST SURGICAL HISTORY: Past Surgical History:  Procedure Laterality Date    ADENOIDECTOMY     ANTERIOR AND POSTERIOR REPAIR WITH SACROSPINOUS FIXATION N/A 09/25/2018   Procedure: ANTERIOR AND POSTERIOR REPAIR, MC CALLS;  Surgeon: Marne Kelly Nest, MD;  Location: [facility];  Service: Gynecology;  Laterality: N/A;   CYSTOSCOPY N/A 09/25/2018   Procedure: CYSTOSCOPY;  Surgeon: Marne Kelly Nest, MD;  Location: [facility];  Service: Gynecology;  Laterality: N/A;   CYSTOSCOPY W/ RETROGRADES Bilateral 01/06/2024   Procedure: CYSTOSCOPY, WITH RETROGRADE PYELOGRAM;  Surgeon: Alvaro Ricardo KATHEE Mickey., MD;  Location: [facility];  Service: Urology;  Laterality: Bilateral;   CYSTOSCOPY WITH RETROGRADE PYELOGRAM, URETEROSCOPY AND STENT PLACEMENT Left 05/29/2020   Procedure: CYSTOSCOPY WITH LEFT  RETROGRADE URETEROSCOPY AND STENT PLACEMENT;  Surgeon: Watt Rush, MD;  Location: [facility];  Service: Urology;  Laterality: Left;   CYSTOSCOPY/URETEROSCOPY/HOLMIUM LASER/STENT PLACEMENT Bilateral 01/06/2024   Procedure: CYSTOSCOPY/URETEROSCOPY/HOLMIUM LASER/STENT PLACEMENT;  Surgeon: Alvaro Ricardo KATHEE Mickey., MD;  Location: [facility];  Service: Urology;  Laterality: Bilateral;   DILATION AND CURETTAGE OF UTERUS N/A 03/30/2018   Procedure: DILATATION AND EVACUATION;  Surgeon: Johnnye Ade, MD;  Location: [facility];  Service: Gynecology;  Laterality: N/A;   TONSILLECTOMY     VAGINAL HYSTERECTOMY Bilateral 09/25/2018   Procedure: VAGINAL HYSTERECTOMY, BILATERAL FIMBRECTOMY;  Surgeon: Marne Kelly Nest, MD;  Location: [facility];  Service: Gynecology;  Laterality: Bilateral;   VAGINAL HYSTERECTOMY     WISDOM TOOTH EXTRACTION      ALLERGIES: Allergies  Allergen Reactions   Cat Dander Hives and Rash    FAMILY HISTORY: Family History  Problem Relation Age of Onset   Diabetes Mother    Hypertension Mother    Healthy Daughter     SOCIAL HISTORY: Social History   Tobacco Use   Smoking status: Never   Smokeless tobacco: Never  Vaping Use   Vaping status: Never Used  Substance Use Topics    Alcohol use: Yes    Comment: occasional   Drug use: No   Social History   Social History Narrative   Are you right handed or left handed? Right   Are you currently employed ?    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   Who lives with you? children   What type of home do you live in: 1 story or 2 story? two    Caffeine 2 cups weekly      Objective:  Vital Signs:  LMP 09/02/2018 (Exact Date)   ***  Labs and Imaging review: No new results***  Previously reviewed results: 01/08/24: CMP unremarkable CBC unremarkable (MCV 96.1)   12/05/23: Vit D 18.25 B12: 449   08/26/23: B12: 209 Folate wnl TSH wnl   Imaging/Procedures: MRI brain w/wo contrast (07/04/19): FINDINGS: Brain: Cerebral volume within normal limits for patient age. No focal parenchymal signal abnormality identified.   No abnormal foci of restricted diffusion to suggest acute or subacute ischemia. Gray-white matter differentiation well maintained. No encephalomalacia to suggest chronic infarction. No foci of susceptibility artifact to suggest acute or chronic intracranial hemorrhage.   No mass lesion, midline shift or mass effect. No hydrocephalus. No extra-axial fluid collection. Major dural sinuses are grossly patent.   Pituitary gland and suprasellar region are normal. Midline structures intact and normal.   No abnormal enhancement.   Vascular: Major intracranial vascular flow voids well maintained and normal in appearance.   Skull and upper cervical spine: Cerebellar tonsillar ectopia of up to 5 mm without frank Chiari malformation. Visualized upper cervical spine within normal limits. Bone marrow signal intensity normal. 16 mm T2 hypointense soft tissue nodule at the right parietal scalp noted, nonspecific, but of doubtful clinical significance.   Sinuses/Orbits: Globes and orbital soft tissues within normal limits.   Paranasal sinuses are  clear. No mastoid effusion. Inner ear structures normal.   Other: None.   IMPRESSION: 1. No acute intracranial abnormality. 2. 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 3. Otherwise unremarkable and normal brain MRI.  Assessment/Plan:  This is Norma [name], a 33 y.o. female with: ***   Plan: ***  Return to clinic in ***  Total time spent reviewing records, interview, history/exam, documentation, and coordination of care on day of encounter:  *** min  Venetia Potters, MD

## 2024-05-03 ENCOUNTER — Ambulatory Visit: Admitting: Neurology

## 2024-05-03 ENCOUNTER — Encounter: Payer: Self-pay | Admitting: Neurology

## 2024-06-22 ENCOUNTER — Encounter (HOSPITAL_BASED_OUTPATIENT_CLINIC_OR_DEPARTMENT_OTHER): Payer: Self-pay

## 2024-06-22 ENCOUNTER — Other Ambulatory Visit: Payer: Self-pay

## 2024-06-22 ENCOUNTER — Emergency Department (HOSPITAL_BASED_OUTPATIENT_CLINIC_OR_DEPARTMENT_OTHER)
Admission: EM | Admit: 2024-06-22 | Discharge: 2024-06-22 | Disposition: A | Discharge status: Home / Self Care | Payer: Self-pay | Source: Ambulatory Visit | Attending: Emergency Medicine | Admitting: Emergency Medicine

## 2024-06-22 ENCOUNTER — Emergency Department (HOSPITAL_BASED_OUTPATIENT_CLINIC_OR_DEPARTMENT_OTHER): Payer: Self-pay

## 2024-06-22 DIAGNOSIS — R1031 Right lower quadrant pain: Secondary | ICD-10-CM | POA: Insufficient documentation

## 2024-06-22 DIAGNOSIS — R1032 Left lower quadrant pain: Secondary | ICD-10-CM | POA: Insufficient documentation

## 2024-06-22 DIAGNOSIS — R310 Gross hematuria: Secondary | ICD-10-CM | POA: Insufficient documentation

## 2024-06-22 LAB — URINALYSIS, ROUTINE W REFLEX MICROSCOPIC
Bacteria, UA: NONE SEEN
Bilirubin Urine: NEGATIVE
Glucose, UA: NEGATIVE mg/dL
Ketones, ur: NEGATIVE mg/dL
Nitrite: NEGATIVE
RBC / HPF: >50 RBC/hpf (ref 0–5)
Specific Gravity, Urine: 1.013 (ref 1.005–1.030)
WBC, UA: >50 WBC/hpf (ref 0–5)
pH: 6 (ref 5.0–8.0)

## 2024-06-22 LAB — CBC
HCT: 37.3 % (ref 36.0–46.0)
Hemoglobin: 12.6 g/dL (ref 12.0–15.0)
MCH: 31.9 pg (ref 26.0–34.0)
MCHC: 33.8 g/dL (ref 30.0–36.0)
MCV: 94.4 fL (ref 80.0–100.0)
Platelets: 251 K/uL (ref 150–400)
RBC: 3.95 MIL/uL (ref 3.87–5.11)
RDW: 12 % (ref 11.5–15.5)
WBC: 7.6 K/uL (ref 4.0–10.5)
nRBC: 0 % (ref 0.0–0.2)

## 2024-06-22 LAB — BASIC METABOLIC PANEL WITH GFR
Anion gap: 11 (ref 5–15)
BUN: 10 mg/dL (ref 6–20)
CO2: 26 mmol/L (ref 22–32)
Calcium: 9.9 mg/dL (ref 8.9–10.3)
Chloride: 101 mmol/L (ref 98–111)
Creatinine, Ser: 0.68 mg/dL (ref 0.44–1.00)
GFR, Estimated: >60 mL/min (ref >60)
Glucose, Bld: 125 mg/dL — ABNORMAL HIGH (ref 70–99)
Potassium: 3.7 mmol/L (ref 3.5–5.1)
Sodium: 138 mmol/L (ref 135–145)

## 2024-06-22 NOTE — ED Provider Notes (Signed)
 [place] EMERGENCY DEPARTMENT AT [facility] Provider Note   CSN: [id number] Arrival date & time: 06/22/24  8397     Patient presents with: Hematuria   Norma [name] is a 33 y.o. female.   Patient to ED with complaint of bilateral flank pain for the past 2 days associated with hematuria, passing more blood with clots today. History of kidney stones in the past requiring surgery. She has strep throat with symptoms since Monday of fever and nausea.   The history is provided by the patient. No language interpreter was used.  Hematuria       Prior to Admission medications   Medication Sig Start Date End Date Taking? Authorizing Provider  amphetamine -dextroamphetamine  (ADDERALL XR) 30 MG 24 hr capsule Take 1 capsule (30 mg total) by mouth daily. 03/09/24   Mozingo, Regina Nattalie, NP  amphetamine -dextroamphetamine  (ADDERALL XR) 30 MG 24 hr capsule Take 1 capsule (30 mg total) by mouth daily. 04/06/24   Mozingo, Regina Nattalie, NP  amphetamine -dextroamphetamine  (ADDERALL XR) 30 MG 24 hr capsule Take 1 capsule (30 mg total) by mouth daily. 05/04/24   Mozingo, Regina Nattalie, NP  amphetamine -dextroamphetamine  (ADDERALL) 20 MG tablet Take 1 tablet (20 mg total) by mouth daily. 03/09/24   Mozingo, Regina Nattalie, NP  amphetamine -dextroamphetamine  (ADDERALL) 20 MG tablet Take 1 tablet (20 mg total) by mouth daily. 04/06/24   Mozingo, Regina Nattalie, NP  amphetamine -dextroamphetamine  (ADDERALL) 20 MG tablet Take 1 tablet (20 mg total) by mouth daily. 05/04/24   Mozingo, Regina Nattalie, NP  cholecalciferol (VITAMIN D3) 25 MCG (1000 UNIT) tablet Take 1,000 Units by mouth daily.    [provider]  Cyanocobalamin  (VITAMIN B-12) 2500 MCG SUBL Place 2,500 mcg under the tongue daily.    [provider]  FLUoxetine  (PROZAC ) 20 MG capsule TAKE 1 CAPSULE BY MOUTH EVERY DAY 04/17/24   Mozingo, Regina Nattalie, NP  nortriptyline  (PAMELOR ) 10 MG capsule Take 2 capsules (20 mg  total) by mouth at bedtime. Take 1 capsule (10 mg) at bedtime for 1 week, then increase to 2 capsules (20 mg) at bedtime thereafter 01/25/24   Leigh Venetia LITTIE, MD  ondansetron  (ZOFRAN -ODT) 4 MG disintegrating tablet Take 1 tablet (4 mg total) by mouth every 8 (eight) hours as needed for nausea or vomiting. 01/02/24   Neysa Caron PARAS, DO  senna-docusate (SENOKOT-S) 8.6-50 MG tablet Take 1 tablet by mouth 2 (two) times daily. While taking strongest pain meds to prevent constipation. Patient not taking: Reported on 01/25/2024 01/06/24   Alvaro Ricardo KATHEE Mickey., MD  Ubrogepant  (UBRELVY ) 100 MG TABS Take 1 tablet (100 mg total) by mouth as needed. 01/25/24   Leigh Venetia LITTIE, MD    Allergies: Cat dander    Review of Systems  Genitourinary:  Positive for hematuria.    Updated Vital Signs BP (!) 123/96   Pulse 96   Temp 98.2 F (36.8 C) (Oral)   Resp 16   Ht 5' 7 (1.702 m)   Wt 75.8 kg   LMP 09/02/2018 (Exact Date)   SpO2 100%   BMI 26.16 kg/m   Physical Exam Vitals and nursing note reviewed.  Constitutional:      Appearance: She is well-developed.  HENT:     Head: Normocephalic.  Cardiovascular:     Rate and Rhythm: Normal rate and regular rhythm.     Heart sounds: No murmur heard. Pulmonary:     Effort: Pulmonary effort is normal.     Breath sounds: Normal breath sounds. No  wheezing, rhonchi or rales.  Abdominal:     General: Bowel sounds are normal.     Palpations: Abdomen is soft.     Tenderness: There is abdominal tenderness (Mild tenderness across lower abdomen.). There is right CVA tenderness and left CVA tenderness. There is no guarding or rebound.  Musculoskeletal:        General: Normal range of motion.     Cervical back: Normal range of motion and neck supple.  Skin:    General: Skin is warm and dry.  Neurological:     General: No focal deficit present.     Mental Status: She is alert and oriented to person, place, and time.     (all labs ordered are listed, but only  abnormal results are displayed) Labs Reviewed  URINALYSIS, ROUTINE W REFLEX MICROSCOPIC - Abnormal; Notable for the following components:      Result Value   Color, Urine ORANGE (*)    APPearance HAZY (*)    Hgb urine dipstick LARGE (*)    Protein, ur TRACE (*)    Leukocytes,Ua TRACE (*)    All other components within normal limits  BASIC METABOLIC PANEL WITH GFR - Abnormal; Notable for the following components:   Glucose, Bld 125 (*)    All other components within normal limits  URINE CULTURE  CBC    EKG: None  Radiology: CT Renal Stone Study Result Date: 06/22/2024 CLINICAL DATA:  Abdominal pain. EXAM: CT ABDOMEN AND PELVIS WITHOUT CONTRAST TECHNIQUE: Multidetector CT imaging of the abdomen and pelvis was performed following the standard protocol without IV contrast. RADIATION DOSE REDUCTION: This exam was performed according to the departmental dose-optimization program which includes automated exposure control, adjustment of the mA and/or kV according to patient size and/or use of iterative reconstruction technique. COMPARISON:  January 07, 2024 FINDINGS: Lower chest: No acute abnormality. Hepatobiliary: No focal liver abnormality is seen. No gallstones, gallbladder wall thickening, or biliary dilatation. Pancreas: Unremarkable. No pancreatic ductal dilatation or surrounding inflammatory changes. Spleen: Normal in size without focal abnormality. Adrenals/Urinary Tract: Adrenal glands are unremarkable. Kidneys are normal, without obstructing renal calculi, focal lesion, or hydronephrosis. 1 mm nonobstructing renal calculi are seen within both kidneys. The bilateral endo ureteral stents seen on the prior study have been removed. Bladder is unremarkable. Stomach/Bowel: Stomach is within normal limits. Appendix appears normal. No evidence of bowel wall thickening, distention, or inflammatory changes. Vascular/Lymphatic: No significant vascular findings are present. No enlarged abdominal or  pelvic lymph nodes. Reproductive: Status post hysterectomy. No adnexal masses. Other: No abdominal wall hernia or abnormality. No abdominopelvic ascites. Musculoskeletal: No acute or significant osseous findings. IMPRESSION: Bilateral 1 mm nonobstructing renal calculi. Electronically Signed   By: Suzen Dials M.D.   On: 06/22/2024 18:44     Procedures   Medications Ordered in the ED - No data to display  Clinical Course as of 06/22/24 1940  Fri Jun 22, 2024  1759 Patient with h/o kidney stones here with bilateral flank pain and hematuria. She has been diagnosed with strep throat as well no unsure if her fever and nausea were due to a UTI or associated with her strep infection.   [SU]  1900 CT renal showing, per radiology:  IMPRESSION: Bilateral 1 mm nonobstructing renal calculi.   Her renal function is normal, UA without bacteria >50 RBC. [SU]  1939 Discussed antibiotics for UTI with >50 WBC, however, no bacteriuria. Patient opts not to go on abx and will wait for culture. She will  see Dr. Alvaro (Urology) next week for recheck of gross hematuria. Return precautions discussed.  [SU]    Clinical Course User Index [SU] Odell Balls, PA-C                                 Medical Decision Making Amount and/or Complexity of Data Reviewed Labs: ordered. Radiology: ordered.        Final diagnoses:  Gross hematuria    ED Discharge Orders     None          Odell Balls, PA-C 06/22/24 1940    Doretha Folks, MD 06/25/24 2325

## 2024-06-22 NOTE — Discharge Instructions (Signed)
 As we discussed, you will need to follow up with Dr. Alvaro next week for recheck and any further evaluation of bloody urine needed. A culture is pending on the urine test from tonight and can be reviewed with Dr. Alvaro next week.   If you develop a high fever, excessive vomiting, severe pain or new concern, please return to the emergency department for further care.

## 2024-06-22 NOTE — ED Triage Notes (Signed)
 Pt reports strep throat since Monday but not dx until yesterday. Pt reports starting abx yesterday. Pt reports hematuria starting yesterday. Pt has hx of kidney stones. Pt endorses bilateral flank pain.

## 2024-06-25 LAB — URINE CULTURE: Culture: NO GROWTH

## 2024-07-10 ENCOUNTER — Ambulatory Visit: Admitting: Dermatology

## 2024-09-10 ENCOUNTER — Telehealth: Payer: Self-pay | Admitting: Adult Health

## 2024-09-10 ENCOUNTER — Encounter: Payer: Self-pay | Admitting: Adult Health

## 2024-09-10 DIAGNOSIS — F411 Generalized anxiety disorder: Secondary | ICD-10-CM

## 2024-09-10 DIAGNOSIS — F909 Attention-deficit hyperactivity disorder, unspecified type: Secondary | ICD-10-CM

## 2024-09-10 MED ORDER — FLUOXETINE HCL 20 MG PO CAPS: 20.0000 mg | ORAL_CAPSULE | Freq: Every day | ORAL | 1 refills | Status: AC

## 2024-09-10 MED ORDER — AMPHETAMINE-DEXTROAMPHET ER 30 MG PO CP24: 30.0000 mg | ORAL_CAPSULE | Freq: Every day | ORAL | 0 refills | Status: AC

## 2024-09-10 MED ORDER — AMPHETAMINE-DEXTROAMPHETAMINE 20 MG PO TABS: 20.0000 mg | ORAL_TABLET | Freq: Every day | ORAL | 0 refills | Status: AC

## 2024-09-10 NOTE — Progress Notes (Signed)
 Norma [name] [id number] 09/22/1990 33 y.o.  Virtual Visit via Video Note  I connected with pt @ on 09/10/2024 at  9:00 AM EST by a video enabled telemedicine application and verified that I am speaking with the correct person using two identifiers.   I discussed the limitations of evaluation and management by telemedicine and the availability of in person appointments. The patient expressed understanding and agreed to proceed.  I discussed the assessment and treatment plan with the patient. The patient was provided an opportunity to ask questions and all were answered. The patient agreed with the plan and demonstrated an understanding of the instructions.   The patient was advised to call back or seek an in-person evaluation if the symptoms worsen or if the condition fails to improve as anticipated.  I provided 15 minutes of non-face-to-face time during this encounter.  The patient was located at home.  The provider was located at [facility].   Angeline LOISE Sayers, NP   Subjective:   Patient ID:  Norma [name] is a 33 y.o. (DOB May 13, 1991) female.  Chief Complaint: No chief complaint on file.   HPI Norma [name] presents for follow-up of GAD and ADHD.  Describes mood today as ok. Pleasant. Mood symptoms - denies depression, anxiety and irritability. Reports stable interest and motivation. Denies panic attacks. Denies worry, rumination and over thinking. Reports mood is stable. Stating I feel like I'm doing alright. Feels like medications work well. Taking medications as prescribed.  Energy levels stable. Active, has a regular exercise routine.  Enjoys some usual interests and activities. Married. Lives with husband and 3 children. Husband's family local. She has family in [place]. Spending time with family.  Appetite adequate. Weight gain - 165 to 170 pounds. Sleeps well most nights. Averages 7 to 8 hours.   Focus and concentration stable. Completing tasks. Managing  aspects of household. Works full time as a paramedic.  Denies SI or HI.  Denies AH or VH. Denies self harm. Denies substance use.  Review of Systems:  Review of Systems  Musculoskeletal:  Negative for gait problem.  Neurological:  Negative for tremors.  Psychiatric/Behavioral:         Please refer to HPI    Medications: I have reviewed the patient's current medications.  Current Outpatient Medications  Medication Sig Dispense Refill   amphetamine -dextroamphetamine  (ADDERALL XR) 30 MG 24 hr capsule Take 1 capsule (30 mg total) by mouth daily. 30 capsule 0   amphetamine -dextroamphetamine  (ADDERALL XR) 30 MG 24 hr capsule Take 1 capsule (30 mg total) by mouth daily. 30 capsule 0   amphetamine -dextroamphetamine  (ADDERALL XR) 30 MG 24 hr capsule Take 1 capsule (30 mg total) by mouth daily. 30 capsule 0   amphetamine -dextroamphetamine  (ADDERALL) 20 MG tablet Take 1 tablet (20 mg total) by mouth daily. 30 tablet 0   amphetamine -dextroamphetamine  (ADDERALL) 20 MG tablet Take 1 tablet (20 mg total) by mouth daily. 30 tablet 0   amphetamine -dextroamphetamine  (ADDERALL) 20 MG tablet Take 1 tablet (20 mg total) by mouth daily. 30 tablet 0   cholecalciferol (VITAMIN D3) 25 MCG (1000 UNIT) tablet Take 1,000 Units by mouth daily.     Cyanocobalamin  (VITAMIN B-12) 2500 MCG SUBL Place 2,500 mcg under the tongue daily.     FLUoxetine  (PROZAC ) 20 MG capsule TAKE 1 CAPSULE BY MOUTH EVERY DAY 90 capsule 1   nortriptyline  (PAMELOR ) 10 MG capsule Take 2 capsules (20 mg total) by mouth at bedtime. Take 1 capsule (10 mg) at bedtime  for 1 week, then increase to 2 capsules (20 mg) at bedtime thereafter 60 capsule 3   ondansetron  (ZOFRAN -ODT) 4 MG disintegrating tablet Take 1 tablet (4 mg total) by mouth every 8 (eight) hours as needed for nausea or vomiting. 20 tablet 0   senna-docusate (SENOKOT-S) 8.6-50 MG tablet Take 1 tablet by mouth 2 (two) times daily. While taking strongest pain meds to prevent  constipation. (Patient not taking: Reported on 01/25/2024) 10 tablet 0   Ubrogepant  (UBRELVY ) 100 MG TABS Take 1 tablet (100 mg total) by mouth as needed. 30 tablet 5   No current facility-administered medications for this visit.    Medication Side Effects: None  Allergies: Allergies[1]  Past Medical History:  Diagnosis Date   Anemia    Anxiety    Asthma    exercise and allergy induced   Chronic allergic rhinitis 05/07/2019   Dysmenorrhea    Endometriosis 08/26/2023   Pt reported; found during partial hysterectomy    History of kidney stones    Migraine headache    Right ureteral stone     Family History  Problem Relation Age of Onset   Diabetes Mother    Hypertension Mother    Healthy Daughter     Social History   Socioeconomic History   Marital status: Married    Spouse name: Not on file   Number of children: 3   Years of education: Not on file   Highest education level: Not on file  Occupational History   Occupation: stay at home mom  Tobacco Use   Smoking status: Never   Smokeless tobacco: Never  Vaping Use   Vaping status: Never Used  Substance and Sexual Activity   Alcohol use: Yes    Comment: occasional   Drug use: No   Sexual activity: Yes    Birth control/protection: None, Surgical  Other Topics Concern   Not on file  Social History Narrative   Are you right handed or left handed? Right   Are you currently employed ?    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   Who lives with you? children   What type of home do you live in: 1 story or 2 story? two    Caffeine 2 cups weekly   Social Drivers of Health   Tobacco Use: Low Risk (09/10/2024)   Patient History    Smoking Tobacco Use: Never    Smokeless Tobacco Use: Never    Passive Exposure: Not on file  Financial Resource Strain: Not on file  Food Insecurity: Not on file  Transportation Needs: Not on file  Physical Activity: Not on file   Stress: Not on file  Social Connections: Not on file  Intimate Partner Violence: Not on file  Depression (PHQ2-9): Low Risk (12/05/2023)   Depression (PHQ2-9)    PHQ-2 Score: 0  Alcohol Screen: Not on file  Housing: Not on file  Utilities: Not on file  Health Literacy: Not on file    Past Medical History, Surgical history, Social history, and Family history were reviewed and updated as appropriate.   Please see review of systems for further details on the patient's review from today.   Objective:   Physical Exam:  LMP 09/02/2018   Physical Exam Constitutional:      General: She is not in acute distress. Musculoskeletal:        General: No deformity.  Neurological:     Mental Status: She  is alert and oriented to person, place, and time.     Coordination: Coordination normal.  Psychiatric:        Attention and Perception: Attention and perception normal. She does not perceive auditory or visual hallucinations.        Mood and Affect: Mood normal. Mood is not anxious or depressed. Affect is not labile, blunt, angry or inappropriate.        Speech: Speech normal.        Behavior: Behavior normal.        Thought Content: Thought content normal. Thought content is not paranoid or delusional. Thought content does not include homicidal or suicidal ideation. Thought content does not include homicidal or suicidal plan.        Cognition and Memory: Cognition and memory normal.        Judgment: Judgment normal.     Comments: Insight intact     Lab Review:     Component Value Date/Time   NA 138 06/22/2024 1612   K 3.7 06/22/2024 1612   CL 101 06/22/2024 1612   CO2 26 06/22/2024 1612   GLUCOSE 125 (H) 06/22/2024 1612   BUN 10 06/22/2024 1612   CREATININE 0.68 06/22/2024 1612   CALCIUM 9.9 06/22/2024 1612   PROT 6.7 01/08/2024 0646   ALBUMIN 4.4 01/08/2024 0646   AST 23 01/08/2024 0646   ALT 31 01/08/2024 0646   ALKPHOS 59 01/08/2024 0646   BILITOT 0.4 01/08/2024 0646    GFRNONAA >60 06/22/2024 1612   GFRAA >60 05/26/2020 0338       Component Value Date/Time   WBC 7.6 06/22/2024 1612   RBC 3.95 06/22/2024 1612   HGB 12.6 06/22/2024 1612   HCT 37.3 06/22/2024 1612   PLT 251 06/22/2024 1612   MCV 94.4 06/22/2024 1612   MCH 31.9 06/22/2024 1612   MCHC 33.8 06/22/2024 1612   RDW 12.0 06/22/2024 1612   LYMPHSABS 1.5 01/07/2024 0659   MONOABS 0.7 01/07/2024 0659   EOSABS 0.0 01/07/2024 0659   BASOSABS 0.0 01/07/2024 0659    No results found for: POCLITH, LITHIUM   No results found for: PHENYTOIN, PHENOBARB, VALPROATE, CBMZ   .res Assessment: Plan:    Plan:  PDMP reviewed  Adderall XR 30 daily  Adderall 20mg  daily Prozac  20mg  daily  Monitor BP between visits while taking stimulant medication.   RTC 6 months - will call in 3 months for next set of refills.   15 minutes spent dedicated to the care of this patient on the date of this encounter to include pre-visit review of records, ordering of medication, post visit documentation, and face-to-face time with the patient discussing GAD and ADHD. Discussed continuing current medication regimen.  Patient advised to contact office with any questions, adverse effects, or acute worsening in signs and symptoms.  Discussed potential benefits, risks, and side effects of stimulants with patient to include increased heart rate, palpitations, insomnia, increased anxiety, inceased irritability, or decreased appetite.  Instructed patient to contact office if experiencing any significant tolerability issues.  Diagnoses and all orders for this visit:  Attention deficit hyperactivity disorder (ADHD), unspecified ADHD type  Generalized anxiety disorder     Please see After Visit Summary for patient specific instructions.  Future Appointments  Date Time Provider Department Center  09/10/2024  9:00 AM Vasti Yagi, Angeline Mattocks, NP [facility] None  10/10/2024 10:30 AM Leigh Venetia CROME, MD [facility] None     No orders of the defined types were placed in this encounter.     -------------------------------     [  1]  Allergies Allergen Reactions   Cat Dander Hives and Rash

## 2024-10-01 NOTE — Progress Notes (Unsigned)
 "  NEUROLOGY FOLLOW UP OFFICE NOTE  Norma [name] [id number]  Subjective:  Norma [name] is a 34 y.o. year old right-handed female with a medical history of nephrolithiasis, vit D deficiency, B12 deficiency, ADHD who we last saw on 01/25/24 for headaches.  To briefly review: 01/25/24: Patient's headaches started in 2019 after a difficult delivery of her 3rd child. The headaches will typically start in the neck, radiates into the back of her head and into her forehead. It is a tight, throbbing pain. She does not think it is a muscle tightness though. If she catches it early, and takes excedrin it can reduce the pain. The pain can be 9/10. If she takes something, it may go down to 3/10. The headaches can last days. If her headache gets back, she can feel dizziness, like she is rocking and her eyes can't focus. She does not feel well on her feet. She has photophobia, phonophobia, nausea, and vomiting. She has at least 2 episodes of multiple day headaches per month (8-10 headache days per month). On the other 20 days, she may have a dull headache sensation too, but she is very used to this.   Eye strain seems to be a trigger. Putting her chin to her chest also seems to make this worse. When she gets a headache she wants to lay in cold dark room and sleep it off with a cold rag when the pain is < 5/10. When it is worse, she is more restless or crying in pain.   She was initially given rizatriptan  but this added to dizziness and did not fully help. She is now taking sumatriptan , but this can contribute to dizziness. She does not take the sumatriptan  at headache onset due to this though. She has never been on a preventative medication.   She is not sure if bearing down will trigger a headache, but it would make headache worse. She also mentions grinding her teeth at night that makes her headaches worse. She used to have a mouth guard but does not currently.   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   Smoker: No OCP use: No Caffiene use: 1 cup of coffee or soda or energy drink per day EtOH use: 1-2 drinks per week Restrictive diet: No Family history of neurologic disease, including headaches: no  Most recent Assessment and Plan (01/25/24): Norma [name] is a 34 y.o. female who presents for evaluation of headaches. She has a relevant medical history of nephrolithiasis, vit D deficiency, B12 deficiency, ADHD. Her neurological examination is normal today. 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 Patient's headaches sound most consistent with migraine without aura with contributions from cervicalgia. The mild cerebellar tonsillar ectopia does not appear to be symptomatic, and may be an incidental finding, but I will monitor symptoms closely to ensure that symptoms are not related. I will begin treating as migraine and monitor response. Patient has tried and failed rizatriptan  and sumatriptan  for migraine rescue (not effective and increased dizziness). I will start nortriptyline  for prevention and get Ubrelvy  approved for rescue.   PLAN: -Continue vit D and B12 supplementation -PT for cervicalgia -For migraines: Migraine prevention:  Nortriptyline  10 mg at bedtime for 1 week, then increase to 20 mg thereafter Migraine rescue:  Ubrelvy  100 mg as needed, samples given today. Zofran  as needed for nausea. Limit use  of pain relievers to no more than 2 days out of week to prevent risk of rebound or medication-overuse headache. Keep headache diary  Since their last visit: Ubrelvy  was approved on 02/14/24 through 01/29/2025. ***  Headaches?*** PT?***  MEDICATIONS:  Outpatient Encounter Medications as of 10/10/2024  Medication Sig   amphetamine -dextroamphetamine  (ADDERALL XR) 30 MG 24 hr capsule Take 1 capsule (30 mg total) by mouth daily.    [START ON 10/08/2024] amphetamine -dextroamphetamine  (ADDERALL XR) 30 MG 24 hr capsule Take 1 capsule (30 mg total) by mouth daily.   [START ON 11/05/2024] amphetamine -dextroamphetamine  (ADDERALL XR) 30 MG 24 hr capsule Take 1 capsule (30 mg total) by mouth daily.   amphetamine -dextroamphetamine  (ADDERALL) 20 MG tablet Take 1 tablet (20 mg total) by mouth daily.   [START ON 10/08/2024] amphetamine -dextroamphetamine  (ADDERALL) 20 MG tablet Take 1 tablet (20 mg total) by mouth daily.   [START ON 11/05/2024] amphetamine -dextroamphetamine  (ADDERALL) 20 MG tablet Take 1 tablet (20 mg total) by mouth daily.   cholecalciferol (VITAMIN D3) 25 MCG (1000 UNIT) tablet Take 1,000 Units by mouth daily.   Cyanocobalamin  (VITAMIN B-12) 2500 MCG SUBL Place 2,500 mcg under the tongue daily.   FLUoxetine  (PROZAC ) 20 MG capsule Take 1 capsule (20 mg total) by mouth daily.   ondansetron  (ZOFRAN -ODT) 4 MG disintegrating tablet Take 1 tablet (4 mg total) by mouth every 8 (eight) hours as needed for nausea or vomiting.   senna-docusate (SENOKOT-S) 8.6-50 MG tablet Take 1 tablet by mouth 2 (two) times daily. While taking strongest pain meds to prevent constipation. (Patient not taking: Reported on 01/25/2024)   Ubrogepant  (UBRELVY ) 100 MG TABS Take 1 tablet (100 mg total) by mouth as needed.   No facility-administered encounter medications on file as of 10/10/2024.    PAST MEDICAL HISTORY: Past Medical History:  Diagnosis Date   Anemia    Anxiety    Asthma    exercise and allergy induced   Chronic allergic rhinitis 05/07/2019   Dysmenorrhea    Endometriosis 08/26/2023   Pt reported; found during partial hysterectomy    History of kidney stones    Migraine headache    Right ureteral stone     PAST SURGICAL HISTORY: Past Surgical History:  Procedure Laterality Date   ADENOIDECTOMY     ANTERIOR AND POSTERIOR REPAIR WITH SACROSPINOUS FIXATION N/A 09/25/2018   Procedure: ANTERIOR AND POSTERIOR REPAIR, MC CALLS;   Surgeon: Marne Kelly Nest, MD;  Location: [facility];  Service: Gynecology;  Laterality: N/A;   CYSTOSCOPY N/A 09/25/2018   Procedure: CYSTOSCOPY;  Surgeon: Marne Kelly Nest, MD;  Location: [facility];  Service: Gynecology;  Laterality: N/A;   CYSTOSCOPY W/ RETROGRADES Bilateral 01/06/2024   Procedure: CYSTOSCOPY, WITH RETROGRADE PYELOGRAM;  Surgeon: Alvaro Ricardo KATHEE Mickey., MD;  Location: [facility];  Service: Urology;  Laterality: Bilateral;   CYSTOSCOPY WITH RETROGRADE PYELOGRAM, URETEROSCOPY AND STENT PLACEMENT Left 05/29/2020   Procedure: CYSTOSCOPY WITH LEFT  RETROGRADE URETEROSCOPY AND STENT PLACEMENT;  Surgeon: Watt Rush, MD;  Location: [facility];  Service: Urology;  Laterality: Left;   CYSTOSCOPY/URETEROSCOPY/HOLMIUM LASER/STENT PLACEMENT Bilateral 01/06/2024   Procedure: CYSTOSCOPY/URETEROSCOPY/HOLMIUM LASER/STENT PLACEMENT;  Surgeon: Alvaro Ricardo KATHEE Mickey., MD;  Location: [facility];  Service: Urology;  Laterality: Bilateral;   DILATION AND CURETTAGE OF UTERUS N/A 03/30/2018   Procedure: DILATATION AND EVACUATION;  Surgeon: Johnnye Ade, MD;  Location: [facility];  Service: Gynecology;  Laterality: N/A;   TONSILLECTOMY     VAGINAL HYSTERECTOMY Bilateral 09/25/2018   Procedure: VAGINAL HYSTERECTOMY, BILATERAL FIMBRECTOMY;  Surgeon: Marne Kelly Nest, MD;  Location: [facility];  Service: Gynecology;  Laterality: Bilateral;   VAGINAL HYSTERECTOMY     WISDOM TOOTH EXTRACTION      ALLERGIES: Allergies[1]  FAMILY HISTORY: Family History  Problem Relation Age of Onset   Diabetes Mother    Hypertension Mother    Healthy Daughter     SOCIAL HISTORY: Social History[2] Social History   Social History Narrative   Are you right handed or left handed? Right   Are you currently employed ?    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   Who lives with you? children   What type of home do you live in: 1 story or 2 story? two    Caffeine 2 cups weekly       Objective:  Vital Signs:  LMP 09/02/2018   ***  Labs and Imaging review: New results: 06/22/24: BMP significant for glucose 125 CBC unremarkable  CT renal stone (06/22/24): IMPRESSION: Bilateral 1 mm nonobstructing renal calculi.  Previously reviewed results: 01/08/24: CMP unremarkable CBC unremarkable (MCV 96.1)   12/05/23: Vit D 18.25 B12: 449   08/26/23: B12: 209 Folate wnl TSH wnl   Imaging/Procedures: MRI brain w/wo contrast (07/04/19): FINDINGS: Brain: Cerebral volume within normal limits for patient age. No focal parenchymal signal abnormality identified.   No abnormal foci of restricted diffusion to suggest acute or subacute ischemia. Gray-white matter differentiation well maintained. No encephalomalacia to suggest chronic infarction. No foci of susceptibility artifact to suggest acute or chronic intracranial hemorrhage.   No mass lesion, midline shift or mass effect. No hydrocephalus. No extra-axial fluid collection. Major dural sinuses are grossly patent.   Pituitary gland and suprasellar region are normal. Midline structures intact and normal.   No abnormal enhancement.   Vascular: Major intracranial vascular flow voids well maintained and normal in appearance.   Skull and upper cervical spine: Cerebellar tonsillar ectopia of up to 5 mm without frank Chiari malformation. Visualized upper cervical spine within normal limits. Bone marrow signal intensity normal. 16 mm T2 hypointense soft tissue nodule at the right parietal scalp noted, nonspecific, but of doubtful clinical significance.   Sinuses/Orbits: Globes and orbital soft tissues within normal limits.   Paranasal sinuses are clear. No mastoid effusion. Inner ear structures normal.   Other: None.   IMPRESSION: 1. No acute intracranial abnormality. 2. 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 3. Otherwise unremarkable and normal brain  MRI.  Assessment/Plan:  This is Darryle LITTIE Church, a 34 y.o. female with: ***   Plan: ***  Return to clinic in ***  Total time spent reviewing records, interview, history/exam, documentation, and coordination of care on day of encounter:  *** min  Venetia Potters, MD    [1]  Allergies Allergen Reactions   Cat Dander Hives and Rash  [2]  Social History Tobacco Use   Smoking status: Never   Smokeless tobacco: Never  Vaping Use   Vaping status: Never Used  Substance Use Topics   Alcohol use: Yes    Comment: occasional   Drug use: No   "

## 2024-10-10 ENCOUNTER — Ambulatory Visit: Payer: Self-pay | Admitting: Neurology

## 2024-11-15 ENCOUNTER — Ambulatory Visit: Payer: Self-pay | Admitting: Neurology
